# Patient Record
Sex: Female | Born: 1945 | Race: White | Hispanic: No | Marital: Married | Smoking: Never smoker
Health system: Southern US, Community
[De-identification: ages and names within clinical notes are randomized; demographics above are authoritative.]

## PROBLEM LIST (undated history)

## (undated) DIAGNOSIS — Z923 Personal history of irradiation: Secondary | ICD-10-CM

## (undated) DIAGNOSIS — K5790 Diverticulosis of intestine, part unspecified, without perforation or abscess without bleeding: Secondary | ICD-10-CM

## (undated) DIAGNOSIS — K635 Polyp of colon: Secondary | ICD-10-CM

## (undated) DIAGNOSIS — K7689 Other specified diseases of liver: Secondary | ICD-10-CM

## (undated) DIAGNOSIS — N200 Calculus of kidney: Secondary | ICD-10-CM

## (undated) DIAGNOSIS — I7300 Raynaud's syndrome without gangrene: Secondary | ICD-10-CM

## (undated) DIAGNOSIS — L719 Rosacea, unspecified: Secondary | ICD-10-CM

## (undated) DIAGNOSIS — K219 Gastro-esophageal reflux disease without esophagitis: Secondary | ICD-10-CM

## (undated) DIAGNOSIS — D219 Benign neoplasm of connective and other soft tissue, unspecified: Secondary | ICD-10-CM

## (undated) DIAGNOSIS — G609 Hereditary and idiopathic neuropathy, unspecified: Secondary | ICD-10-CM

## (undated) DIAGNOSIS — Z853 Personal history of malignant neoplasm of breast: Secondary | ICD-10-CM

## (undated) DIAGNOSIS — C801 Malignant (primary) neoplasm, unspecified: Secondary | ICD-10-CM

## (undated) DIAGNOSIS — H269 Unspecified cataract: Secondary | ICD-10-CM

## (undated) DIAGNOSIS — R0781 Pleurodynia: Secondary | ICD-10-CM

## (undated) DIAGNOSIS — M1990 Unspecified osteoarthritis, unspecified site: Secondary | ICD-10-CM

## (undated) DIAGNOSIS — M719 Bursopathy, unspecified: Secondary | ICD-10-CM

## (undated) DIAGNOSIS — Z87442 Personal history of urinary calculi: Secondary | ICD-10-CM

## (undated) DIAGNOSIS — K589 Irritable bowel syndrome without diarrhea: Secondary | ICD-10-CM

## (undated) DIAGNOSIS — K648 Other hemorrhoids: Secondary | ICD-10-CM

## (undated) DIAGNOSIS — C4491 Basal cell carcinoma of skin, unspecified: Secondary | ICD-10-CM

## (undated) DIAGNOSIS — M810 Age-related osteoporosis without current pathological fracture: Secondary | ICD-10-CM

## (undated) HISTORY — DX: Raynaud's syndrome without gangrene: I73.00

## (undated) HISTORY — DX: Age-related osteoporosis without current pathological fracture: M81.0

## (undated) HISTORY — PX: CATARACT EXTRACTION: SUR2

## (undated) HISTORY — DX: Benign neoplasm of connective and other soft tissue, unspecified: D21.9

## (undated) HISTORY — DX: Rosacea, unspecified: L71.9

## (undated) HISTORY — DX: Other specified diseases of liver: K76.89

## (undated) HISTORY — DX: Gastro-esophageal reflux disease without esophagitis: K21.9

## (undated) HISTORY — DX: Calculus of kidney: N20.0

## (undated) HISTORY — DX: Polyp of colon: K63.5

## (undated) HISTORY — PX: HYSTEROSCOPY: SHX211

## (undated) HISTORY — PX: DILATION AND CURETTAGE OF UTERUS: SHX78

## (undated) HISTORY — DX: Pleurodynia: R07.81

## (undated) HISTORY — DX: Unspecified cataract: H26.9

## (undated) HISTORY — PX: TONSILLECTOMY: SUR1361

## (undated) HISTORY — DX: Diverticulosis of intestine, part unspecified, without perforation or abscess without bleeding: K57.90

## (undated) HISTORY — DX: Basal cell carcinoma of skin, unspecified: C44.91

## (undated) HISTORY — DX: Malignant (primary) neoplasm, unspecified: C80.1

## (undated) HISTORY — PX: EXTRACORPOREAL SHOCK WAVE LITHOTRIPSY: SHX1557

## (undated) HISTORY — DX: Irritable bowel syndrome, unspecified: K58.9

## (undated) HISTORY — DX: Bursopathy, unspecified: M71.9

---

## 1971-11-29 HISTORY — PX: SUPRANUMERARY NIPPLE EXCISION: SHX2471

## 1971-11-29 HISTORY — PX: BREAST EXCISIONAL BIOPSY: SUR124

## 1999-05-01 DIAGNOSIS — K219 Gastro-esophageal reflux disease without esophagitis: Secondary | ICD-10-CM | POA: Insufficient documentation

## 1999-07-22 DIAGNOSIS — K648 Other hemorrhoids: Secondary | ICD-10-CM | POA: Insufficient documentation

## 2002-09-23 ENCOUNTER — Encounter: Admission: RE | Admit: 2002-09-23 | Discharge: 2002-09-23 | Payer: Self-pay | Admitting: Endocrinology

## 2002-09-23 ENCOUNTER — Encounter: Payer: Self-pay | Admitting: Endocrinology

## 2004-11-15 ENCOUNTER — Ambulatory Visit: Payer: Self-pay | Admitting: Internal Medicine

## 2004-11-28 DIAGNOSIS — C801 Malignant (primary) neoplasm, unspecified: Secondary | ICD-10-CM

## 2004-11-28 DIAGNOSIS — K7689 Other specified diseases of liver: Secondary | ICD-10-CM

## 2004-11-28 HISTORY — PX: BREAST LUMPECTOMY: SHX2

## 2004-11-28 HISTORY — DX: Malignant (primary) neoplasm, unspecified: C80.1

## 2004-11-28 HISTORY — DX: Other specified diseases of liver: K76.89

## 2005-03-02 ENCOUNTER — Ambulatory Visit: Payer: Self-pay | Admitting: Internal Medicine

## 2005-03-02 DIAGNOSIS — K589 Irritable bowel syndrome without diarrhea: Secondary | ICD-10-CM | POA: Insufficient documentation

## 2005-03-04 ENCOUNTER — Ambulatory Visit: Payer: Self-pay | Admitting: Internal Medicine

## 2005-03-09 ENCOUNTER — Ambulatory Visit: Payer: Self-pay | Admitting: Internal Medicine

## 2005-08-10 ENCOUNTER — Encounter: Admission: RE | Admit: 2005-08-10 | Discharge: 2005-08-10 | Payer: Self-pay | Admitting: General Surgery

## 2005-08-22 ENCOUNTER — Ambulatory Visit (HOSPITAL_COMMUNITY): Admission: RE | Admit: 2005-08-22 | Discharge: 2005-08-22 | Payer: Self-pay | Admitting: General Surgery

## 2005-08-22 ENCOUNTER — Encounter (INDEPENDENT_AMBULATORY_CARE_PROVIDER_SITE_OTHER): Payer: Self-pay | Admitting: General Surgery

## 2005-08-22 ENCOUNTER — Ambulatory Visit (HOSPITAL_BASED_OUTPATIENT_CLINIC_OR_DEPARTMENT_OTHER): Admission: RE | Admit: 2005-08-22 | Discharge: 2005-08-22 | Payer: Self-pay | Admitting: General Surgery

## 2005-08-22 ENCOUNTER — Encounter (INDEPENDENT_AMBULATORY_CARE_PROVIDER_SITE_OTHER): Payer: Self-pay | Admitting: *Deleted

## 2005-08-23 ENCOUNTER — Ambulatory Visit (HOSPITAL_BASED_OUTPATIENT_CLINIC_OR_DEPARTMENT_OTHER): Payer: Medicare Other | Admitting: Oncology

## 2005-09-01 ENCOUNTER — Ambulatory Visit: Admission: RE | Admit: 2005-09-01 | Discharge: 2005-11-24 | Payer: Self-pay | Admitting: Radiation Oncology

## 2005-09-07 ENCOUNTER — Ambulatory Visit (HOSPITAL_COMMUNITY): Admission: RE | Admit: 2005-09-07 | Discharge: 2005-09-07 | Payer: Self-pay | Admitting: Radiation Oncology

## 2005-09-20 ENCOUNTER — Ambulatory Visit (HOSPITAL_COMMUNITY): Admission: RE | Admit: 2005-09-20 | Discharge: 2005-09-20 | Payer: Self-pay | Admitting: Oncology

## 2005-11-17 ENCOUNTER — Ambulatory Visit: Payer: Self-pay | Admitting: Oncology

## 2006-01-30 ENCOUNTER — Ambulatory Visit: Payer: Self-pay | Admitting: Oncology

## 2006-04-04 ENCOUNTER — Encounter: Admission: RE | Admit: 2006-04-04 | Discharge: 2006-04-04 | Payer: Self-pay | Admitting: General Surgery

## 2006-05-17 ENCOUNTER — Ambulatory Visit: Payer: Self-pay | Admitting: Internal Medicine

## 2006-05-25 ENCOUNTER — Encounter (INDEPENDENT_AMBULATORY_CARE_PROVIDER_SITE_OTHER): Payer: Self-pay | Admitting: *Deleted

## 2006-05-25 ENCOUNTER — Ambulatory Visit: Payer: Self-pay | Admitting: Internal Medicine

## 2006-05-25 ENCOUNTER — Encounter: Payer: Self-pay | Admitting: Internal Medicine

## 2006-05-25 DIAGNOSIS — D126 Benign neoplasm of colon, unspecified: Secondary | ICD-10-CM | POA: Insufficient documentation

## 2006-07-25 ENCOUNTER — Encounter: Admission: RE | Admit: 2006-07-25 | Discharge: 2006-07-25 | Payer: Self-pay | Admitting: General Surgery

## 2006-07-26 ENCOUNTER — Encounter (INDEPENDENT_AMBULATORY_CARE_PROVIDER_SITE_OTHER): Payer: Self-pay | Admitting: *Deleted

## 2006-07-26 ENCOUNTER — Ambulatory Visit (HOSPITAL_COMMUNITY): Admission: RE | Admit: 2006-07-26 | Discharge: 2006-07-26 | Payer: Self-pay | Admitting: Oncology

## 2006-08-01 ENCOUNTER — Ambulatory Visit: Payer: Self-pay | Admitting: Oncology

## 2006-08-03 LAB — COMPREHENSIVE METABOLIC PANEL
ALT: 15 U/L (ref 0–40)
AST: 21 U/L (ref 0–37)
Albumin: 4.4 g/dL (ref 3.5–5.2)
Alkaline Phosphatase: 81 U/L (ref 39–117)
BUN: 18 mg/dL (ref 6–23)
CO2: 29 mEq/L (ref 19–32)
Calcium: 9.6 mg/dL (ref 8.4–10.5)
Chloride: 106 mEq/L (ref 96–112)
Creatinine, Ser: 0.74 mg/dL (ref 0.40–1.20)
Glucose, Bld: 98 mg/dL (ref 70–99)
Potassium: 4.9 mEq/L (ref 3.5–5.3)
Sodium: 143 mEq/L (ref 135–145)
Total Bilirubin: 0.3 mg/dL (ref 0.3–1.2)
Total Protein: 7 g/dL (ref 6.0–8.3)

## 2006-08-03 LAB — CBC WITH DIFFERENTIAL/PLATELET
BASO%: 0.4 % (ref 0.0–2.0)
Basophils Absolute: 0 10*3/uL (ref 0.0–0.1)
EOS%: 1.8 % (ref 0.0–7.0)
Eosinophils Absolute: 0.1 10*3/uL (ref 0.0–0.5)
HCT: 39.6 % (ref 34.8–46.6)
HGB: 13.6 g/dL (ref 11.6–15.9)
LYMPH%: 32.5 % (ref 14.0–48.0)
MCH: 31 pg (ref 26.0–34.0)
MCHC: 34.3 g/dL (ref 32.0–36.0)
MCV: 90.4 fL (ref 81.0–101.0)
MONO#: 0.4 10*3/uL (ref 0.1–0.9)
MONO%: 8.2 % (ref 0.0–13.0)
NEUT#: 2.8 10*3/uL (ref 1.5–6.5)
NEUT%: 57.1 % (ref 39.6–76.8)
Platelets: 293 10*3/uL (ref 145–400)
RBC: 4.38 10*6/uL (ref 3.70–5.32)
RDW: 13.3 % (ref 11.3–14.5)
WBC: 4.9 10*3/uL (ref 3.9–10.0)
lymph#: 1.6 10*3/uL (ref 0.9–3.3)

## 2006-08-03 LAB — LACTATE DEHYDROGENASE: LDH: 174 U/L (ref 94–250)

## 2006-09-21 ENCOUNTER — Encounter (INDEPENDENT_AMBULATORY_CARE_PROVIDER_SITE_OTHER): Payer: Self-pay | Admitting: *Deleted

## 2006-09-21 ENCOUNTER — Ambulatory Visit (HOSPITAL_COMMUNITY): Admission: RE | Admit: 2006-09-21 | Discharge: 2006-09-21 | Payer: Self-pay | Admitting: Oncology

## 2007-01-22 ENCOUNTER — Ambulatory Visit: Payer: Self-pay | Admitting: Oncology

## 2007-01-24 LAB — CBC WITH DIFFERENTIAL/PLATELET
BASO%: 0.5 % (ref 0.0–2.0)
Basophils Absolute: 0 10*3/uL (ref 0.0–0.1)
EOS%: 1.6 % (ref 0.0–7.0)
Eosinophils Absolute: 0.1 10*3/uL (ref 0.0–0.5)
HCT: 38.4 % (ref 34.8–46.6)
HGB: 13.5 g/dL (ref 11.6–15.9)
LYMPH%: 36.1 % (ref 14.0–48.0)
MCH: 31 pg (ref 26.0–34.0)
MCHC: 35.1 g/dL (ref 32.0–36.0)
MCV: 88.3 fL (ref 81.0–101.0)
MONO#: 0.4 10*3/uL (ref 0.1–0.9)
MONO%: 5.9 % (ref 0.0–13.0)
NEUT#: 3.6 10*3/uL (ref 1.5–6.5)
NEUT%: 55.9 % (ref 39.6–76.8)
Platelets: 303 10*3/uL (ref 145–400)
RBC: 4.35 10*6/uL (ref 3.70–5.32)
RDW: 13.3 % (ref 11.3–14.5)
WBC: 6.5 10*3/uL (ref 3.9–10.0)
lymph#: 2.3 10*3/uL (ref 0.9–3.3)

## 2007-01-24 LAB — COMPREHENSIVE METABOLIC PANEL
ALT: 20 U/L (ref 0–35)
AST: 26 U/L (ref 0–37)
Albumin: 4.6 g/dL (ref 3.5–5.2)
Alkaline Phosphatase: 81 U/L (ref 39–117)
BUN: 16 mg/dL (ref 6–23)
CO2: 28 mEq/L (ref 19–32)
Calcium: 9.6 mg/dL (ref 8.4–10.5)
Chloride: 102 mEq/L (ref 96–112)
Creatinine, Ser: 0.84 mg/dL (ref 0.40–1.20)
Glucose, Bld: 112 mg/dL — ABNORMAL HIGH (ref 70–99)
Potassium: 4 mEq/L (ref 3.5–5.3)
Sodium: 139 mEq/L (ref 135–145)
Total Bilirubin: 0.4 mg/dL (ref 0.3–1.2)
Total Protein: 7.4 g/dL (ref 6.0–8.3)

## 2007-01-24 LAB — LACTATE DEHYDROGENASE: LDH: 184 U/L (ref 94–250)

## 2007-06-04 ENCOUNTER — Ambulatory Visit (HOSPITAL_COMMUNITY): Admission: RE | Admit: 2007-06-04 | Discharge: 2007-06-04 | Payer: Self-pay | Admitting: Urology

## 2007-07-22 ENCOUNTER — Ambulatory Visit: Payer: Self-pay | Admitting: Oncology

## 2007-07-31 ENCOUNTER — Encounter: Admission: RE | Admit: 2007-07-31 | Discharge: 2007-07-31 | Payer: Self-pay | Admitting: Oncology

## 2007-09-06 ENCOUNTER — Ambulatory Visit: Payer: Self-pay | Admitting: Internal Medicine

## 2008-02-05 DIAGNOSIS — Z853 Personal history of malignant neoplasm of breast: Secondary | ICD-10-CM | POA: Insufficient documentation

## 2008-04-02 ENCOUNTER — Ambulatory Visit: Payer: Self-pay | Admitting: Oncology

## 2008-04-04 LAB — CBC WITH DIFFERENTIAL/PLATELET
BASO%: 1.5 % (ref 0.0–2.0)
Basophils Absolute: 0.1 10*3/uL (ref 0.0–0.1)
EOS%: 0.6 % (ref 0.0–7.0)
Eosinophils Absolute: 0 10*3/uL (ref 0.0–0.5)
HCT: 38.3 % (ref 34.8–46.6)
HGB: 13.3 g/dL (ref 11.6–15.9)
LYMPH%: 31 % (ref 14.0–48.0)
MCH: 31.1 pg (ref 26.0–34.0)
MCHC: 34.8 g/dL (ref 32.0–36.0)
MCV: 89.3 fL (ref 81.0–101.0)
MONO#: 0.5 10*3/uL (ref 0.1–0.9)
MONO%: 7.5 % (ref 0.0–13.0)
NEUT#: 3.7 10*3/uL (ref 1.5–6.5)
NEUT%: 59.4 % (ref 39.6–76.8)
Platelets: 273 10*3/uL (ref 145–400)
RBC: 4.3 10*6/uL (ref 3.70–5.32)
RDW: 12.9 % (ref 11.3–14.5)
WBC: 6.2 10*3/uL (ref 3.9–10.0)
lymph#: 1.9 10*3/uL (ref 0.9–3.3)

## 2008-04-07 LAB — COMPREHENSIVE METABOLIC PANEL
ALT: 13 U/L (ref 0–35)
AST: 20 U/L (ref 0–37)
Albumin: 4.4 g/dL (ref 3.5–5.2)
Alkaline Phosphatase: 55 U/L (ref 39–117)
BUN: 15 mg/dL (ref 6–23)
CO2: 26 mEq/L (ref 19–32)
Calcium: 8.9 mg/dL (ref 8.4–10.5)
Chloride: 108 mEq/L (ref 96–112)
Creatinine, Ser: 0.81 mg/dL (ref 0.40–1.20)
Glucose, Bld: 96 mg/dL (ref 70–99)
Potassium: 4.2 mEq/L (ref 3.5–5.3)
Sodium: 144 mEq/L (ref 135–145)
Total Bilirubin: 0.5 mg/dL (ref 0.3–1.2)
Total Protein: 7 g/dL (ref 6.0–8.3)

## 2008-04-07 LAB — VITAMIN D 25 HYDROXY (VIT D DEFICIENCY, FRACTURES): Vit D, 25-Hydroxy: 36 ng/mL (ref 30–89)

## 2008-04-07 LAB — LACTATE DEHYDROGENASE: LDH: 190 U/L (ref 94–250)

## 2008-07-31 ENCOUNTER — Encounter: Admission: RE | Admit: 2008-07-31 | Discharge: 2008-07-31 | Payer: Self-pay | Admitting: Oncology

## 2008-09-05 ENCOUNTER — Encounter: Payer: Self-pay | Admitting: Internal Medicine

## 2008-10-17 ENCOUNTER — Ambulatory Visit: Payer: Self-pay | Admitting: Oncology

## 2008-10-21 LAB — CBC WITH DIFFERENTIAL/PLATELET
BASO%: 0.4 % (ref 0.0–2.0)
Basophils Absolute: 0 10*3/uL (ref 0.0–0.1)
EOS%: 1.1 % (ref 0.0–7.0)
Eosinophils Absolute: 0.1 10*3/uL (ref 0.0–0.5)
HCT: 39 % (ref 34.8–46.6)
HGB: 13.5 g/dL (ref 11.6–15.9)
LYMPH%: 39.1 % (ref 14.0–48.0)
MCH: 31 pg (ref 26.0–34.0)
MCHC: 34.5 g/dL (ref 32.0–36.0)
MCV: 89.9 fL (ref 81.0–101.0)
MONO#: 0.4 10*3/uL (ref 0.1–0.9)
MONO%: 7.2 % (ref 0.0–13.0)
NEUT#: 3.1 10*3/uL (ref 1.5–6.5)
NEUT%: 52.2 % (ref 39.6–76.8)
Platelets: 268 10*3/uL (ref 145–400)
RBC: 4.34 10*6/uL (ref 3.70–5.32)
RDW: 13 % (ref 11.3–14.5)
WBC: 6 10*3/uL (ref 3.9–10.0)
lymph#: 2.3 10*3/uL (ref 0.9–3.3)

## 2008-10-22 LAB — COMPREHENSIVE METABOLIC PANEL
ALT: 14 U/L (ref 0–35)
AST: 20 U/L (ref 0–37)
Albumin: 4.4 g/dL (ref 3.5–5.2)
Alkaline Phosphatase: 57 U/L (ref 39–117)
BUN: 15 mg/dL (ref 6–23)
CO2: 26 mEq/L (ref 19–32)
Calcium: 9.6 mg/dL (ref 8.4–10.5)
Chloride: 101 mEq/L (ref 96–112)
Creatinine, Ser: 0.82 mg/dL (ref 0.40–1.20)
Glucose, Bld: 89 mg/dL (ref 70–99)
Potassium: 4.9 mEq/L (ref 3.5–5.3)
Sodium: 141 mEq/L (ref 135–145)
Total Bilirubin: 0.3 mg/dL (ref 0.3–1.2)
Total Protein: 6.7 g/dL (ref 6.0–8.3)

## 2008-10-22 LAB — VITAMIN D 25 HYDROXY (VIT D DEFICIENCY, FRACTURES): Vit D, 25-Hydroxy: 37 ng/mL (ref 30–89)

## 2008-10-22 LAB — LACTATE DEHYDROGENASE: LDH: 171 U/L (ref 94–250)

## 2008-10-22 LAB — CANCER ANTIGEN 27.29: CA 27.29: 31 U/mL (ref 0–39)

## 2009-03-27 ENCOUNTER — Encounter: Payer: Self-pay | Admitting: Internal Medicine

## 2009-04-24 ENCOUNTER — Ambulatory Visit: Payer: Self-pay | Admitting: Oncology

## 2009-05-18 LAB — CBC WITH DIFFERENTIAL/PLATELET
BASO%: 0.4 % (ref 0.0–2.0)
Basophils Absolute: 0 10*3/uL (ref 0.0–0.1)
EOS%: 1.5 % (ref 0.0–7.0)
Eosinophils Absolute: 0.1 10*3/uL (ref 0.0–0.5)
HCT: 36.9 % (ref 34.8–46.6)
HGB: 13 g/dL (ref 11.6–15.9)
LYMPH%: 33.8 % (ref 14.0–49.7)
MCH: 31.9 pg (ref 25.1–34.0)
MCHC: 35.3 g/dL (ref 31.5–36.0)
MCV: 90.4 fL (ref 79.5–101.0)
MONO#: 0.4 10*3/uL (ref 0.1–0.9)
MONO%: 6.7 % (ref 0.0–14.0)
NEUT#: 3.7 10*3/uL (ref 1.5–6.5)
NEUT%: 57.6 % (ref 38.4–76.8)
Platelets: 267 10*3/uL (ref 145–400)
RBC: 4.08 10*6/uL (ref 3.70–5.45)
RDW: 13.4 % (ref 11.2–14.5)
WBC: 6.4 10*3/uL (ref 3.9–10.3)
lymph#: 2.2 10*3/uL (ref 0.9–3.3)

## 2009-05-19 LAB — COMPREHENSIVE METABOLIC PANEL
ALT: 20 U/L (ref 0–35)
AST: 23 U/L (ref 0–37)
Albumin: 4.2 g/dL (ref 3.5–5.2)
Alkaline Phosphatase: 58 U/L (ref 39–117)
BUN: 13 mg/dL (ref 6–23)
CO2: 27 mEq/L (ref 19–32)
Calcium: 9.5 mg/dL (ref 8.4–10.5)
Chloride: 105 mEq/L (ref 96–112)
Creatinine, Ser: 0.77 mg/dL (ref 0.40–1.20)
Glucose, Bld: 125 mg/dL — ABNORMAL HIGH (ref 70–99)
Potassium: 4.3 mEq/L (ref 3.5–5.3)
Sodium: 141 mEq/L (ref 135–145)
Total Bilirubin: 0.3 mg/dL (ref 0.3–1.2)
Total Protein: 6.6 g/dL (ref 6.0–8.3)

## 2009-05-19 LAB — LACTATE DEHYDROGENASE: LDH: 173 U/L (ref 94–250)

## 2009-05-19 LAB — VITAMIN D 25 HYDROXY (VIT D DEFICIENCY, FRACTURES): Vit D, 25-Hydroxy: 45 ng/mL (ref 30–89)

## 2009-06-29 ENCOUNTER — Ambulatory Visit: Payer: Self-pay | Admitting: Family Medicine

## 2009-06-29 DIAGNOSIS — M810 Age-related osteoporosis without current pathological fracture: Secondary | ICD-10-CM | POA: Insufficient documentation

## 2009-06-29 LAB — CONVERTED CEMR LAB
Bilirubin Urine: NEGATIVE
Blood in Urine, dipstick: NEGATIVE
Glucose, Urine, Semiquant: NEGATIVE
Ketones, urine, test strip: NEGATIVE
Nitrite: NEGATIVE
Protein, U semiquant: NEGATIVE
Specific Gravity, Urine: 1.02
Urobilinogen, UA: 0.2
WBC Urine, dipstick: NEGATIVE
pH: 7

## 2009-07-01 LAB — CONVERTED CEMR LAB
ALT: 22 units/L (ref 0–35)
AST: 29 units/L (ref 0–37)
Albumin: 4.1 g/dL (ref 3.5–5.2)
Alkaline Phosphatase: 52 units/L (ref 39–117)
BUN: 15 mg/dL (ref 6–23)
Basophils Absolute: 0 10*3/uL (ref 0.0–0.1)
Basophils Relative: 0.5 % (ref 0.0–3.0)
Bilirubin, Direct: 0.1 mg/dL (ref 0.0–0.3)
CO2: 32 meq/L (ref 19–32)
Calcium: 8.9 mg/dL (ref 8.4–10.5)
Chloride: 112 meq/L (ref 96–112)
Cholesterol: 209 mg/dL — ABNORMAL HIGH (ref 0–200)
Creatinine, Ser: 0.7 mg/dL (ref 0.4–1.2)
Direct LDL: 120.8 mg/dL
Eosinophils Absolute: 0.1 10*3/uL (ref 0.0–0.7)
Eosinophils Relative: 1.7 % (ref 0.0–5.0)
GFR calc non Af Amer: 89.32 mL/min (ref >60)
Glucose, Bld: 101 mg/dL — ABNORMAL HIGH (ref 70–99)
HCT: 39.5 % (ref 36.0–46.0)
HDL: 70.9 mg/dL (ref >39.00)
Hemoglobin: 13.7 g/dL (ref 12.0–15.0)
Lymphocytes Relative: 38.5 % (ref 12.0–46.0)
Lymphs Abs: 2.3 10*3/uL (ref 0.7–4.0)
MCHC: 34.7 g/dL (ref 30.0–36.0)
MCV: 92.2 fL (ref 78.0–100.0)
Monocytes Absolute: 0.5 10*3/uL (ref 0.1–1.0)
Monocytes Relative: 7.8 % (ref 3.0–12.0)
Neutro Abs: 3.1 10*3/uL (ref 1.4–7.7)
Neutrophils Relative %: 51.5 % (ref 43.0–77.0)
Platelets: 231 10*3/uL (ref 150.0–400.0)
Potassium: 4.8 meq/L (ref 3.5–5.1)
RBC: 4.28 M/uL (ref 3.87–5.11)
RDW: 12.7 % (ref 11.5–14.6)
Sodium: 149 meq/L — ABNORMAL HIGH (ref 135–145)
TSH: 0.82 microintl units/mL (ref 0.35–5.50)
Total Bilirubin: 0.8 mg/dL (ref 0.3–1.2)
Total CHOL/HDL Ratio: 3
Total Protein: 7 g/dL (ref 6.0–8.3)
Triglycerides: 77 mg/dL (ref 0.0–149.0)
VLDL: 15.4 mg/dL (ref 0.0–40.0)
WBC: 6 10*3/uL (ref 4.5–10.5)

## 2009-08-05 ENCOUNTER — Encounter: Admission: RE | Admit: 2009-08-05 | Discharge: 2009-08-05 | Payer: Self-pay | Admitting: Oncology

## 2009-10-06 ENCOUNTER — Encounter: Admission: RE | Admit: 2009-10-06 | Discharge: 2009-10-06 | Payer: Self-pay | Admitting: Surgery

## 2009-10-29 ENCOUNTER — Ambulatory Visit: Payer: Self-pay | Admitting: Family Medicine

## 2009-11-17 ENCOUNTER — Encounter: Payer: Self-pay | Admitting: Internal Medicine

## 2009-11-23 ENCOUNTER — Telehealth: Payer: Self-pay | Admitting: Family Medicine

## 2010-01-27 ENCOUNTER — Ambulatory Visit: Payer: Self-pay | Admitting: Family Medicine

## 2010-01-27 DIAGNOSIS — N390 Urinary tract infection, site not specified: Secondary | ICD-10-CM | POA: Insufficient documentation

## 2010-01-27 DIAGNOSIS — M545 Low back pain, unspecified: Secondary | ICD-10-CM | POA: Insufficient documentation

## 2010-01-27 LAB — CONVERTED CEMR LAB
Bilirubin Urine: NEGATIVE
Glucose, Urine, Semiquant: NEGATIVE
Ketones, urine, test strip: NEGATIVE
Nitrite: NEGATIVE
Protein, U semiquant: NEGATIVE
Specific Gravity, Urine: 1.015
Urobilinogen, UA: 0.2
pH: 6

## 2010-02-01 ENCOUNTER — Ambulatory Visit: Payer: Self-pay | Admitting: Internal Medicine

## 2010-06-24 ENCOUNTER — Ambulatory Visit: Payer: Self-pay | Admitting: Oncology

## 2010-06-28 LAB — CBC WITH DIFFERENTIAL/PLATELET
BASO%: 0.4 % (ref 0.0–2.0)
Basophils Absolute: 0 10*3/uL (ref 0.0–0.1)
EOS%: 2 % (ref 0.0–7.0)
Eosinophils Absolute: 0.1 10*3/uL (ref 0.0–0.5)
HCT: 38.3 % (ref 34.8–46.6)
HGB: 13.1 g/dL (ref 11.6–15.9)
LYMPH%: 36.8 % (ref 14.0–49.7)
MCH: 31.4 pg (ref 25.1–34.0)
MCHC: 34.2 g/dL (ref 31.5–36.0)
MCV: 91.6 fL (ref 79.5–101.0)
MONO#: 0.5 10*3/uL (ref 0.1–0.9)
MONO%: 8.3 % (ref 0.0–14.0)
NEUT#: 2.9 10*3/uL (ref 1.5–6.5)
NEUT%: 52.5 % (ref 38.4–76.8)
Platelets: 285 10*3/uL (ref 145–400)
RBC: 4.18 10*6/uL (ref 3.70–5.45)
RDW: 13.5 % (ref 11.2–14.5)
WBC: 5.6 10*3/uL (ref 3.9–10.3)
lymph#: 2.1 10*3/uL (ref 0.9–3.3)

## 2010-06-28 LAB — VITAMIN D 25 HYDROXY (VIT D DEFICIENCY, FRACTURES): Vit D, 25-Hydroxy: 55 ng/mL (ref 30–89)

## 2010-06-28 LAB — COMPREHENSIVE METABOLIC PANEL
ALT: 23 U/L (ref 0–35)
AST: 27 U/L (ref 0–37)
Albumin: 4.5 g/dL (ref 3.5–5.2)
Alkaline Phosphatase: 62 U/L (ref 39–117)
BUN: 16 mg/dL (ref 6–23)
CO2: 26 mEq/L (ref 19–32)
Calcium: 10 mg/dL (ref 8.4–10.5)
Chloride: 104 mEq/L (ref 96–112)
Creatinine, Ser: 0.79 mg/dL (ref 0.40–1.20)
Glucose, Bld: 101 mg/dL — ABNORMAL HIGH (ref 70–99)
Potassium: 4.3 mEq/L (ref 3.5–5.3)
Sodium: 141 mEq/L (ref 135–145)
Total Bilirubin: 0.3 mg/dL (ref 0.3–1.2)
Total Protein: 6.8 g/dL (ref 6.0–8.3)

## 2010-06-28 LAB — LACTATE DEHYDROGENASE: LDH: 198 U/L (ref 94–250)

## 2010-07-01 ENCOUNTER — Encounter: Payer: Self-pay | Admitting: Cardiology

## 2010-08-06 ENCOUNTER — Encounter: Admission: RE | Admit: 2010-08-06 | Discharge: 2010-08-06 | Payer: Self-pay | Admitting: Oncology

## 2010-08-11 ENCOUNTER — Ambulatory Visit: Payer: Self-pay | Admitting: Family Medicine

## 2010-08-11 LAB — CONVERTED CEMR LAB
Bilirubin Urine: NEGATIVE
Blood in Urine, dipstick: NEGATIVE
Glucose, Urine, Semiquant: NEGATIVE
Ketones, urine, test strip: NEGATIVE
Nitrite: NEGATIVE
Protein, U semiquant: NEGATIVE
Specific Gravity, Urine: 1.02
Urobilinogen, UA: 0.2
WBC Urine, dipstick: NEGATIVE
pH: 7

## 2010-08-12 LAB — CONVERTED CEMR LAB
ALT: 57 units/L — ABNORMAL HIGH (ref 0–35)
AST: 44 units/L — ABNORMAL HIGH (ref 0–37)
Albumin: 4.2 g/dL (ref 3.5–5.2)
Alkaline Phosphatase: 62 units/L (ref 39–117)
BUN: 16 mg/dL (ref 6–23)
Basophils Absolute: 0 10*3/uL (ref 0.0–0.1)
Basophils Relative: 0.5 % (ref 0.0–3.0)
Bilirubin, Direct: 0.1 mg/dL (ref 0.0–0.3)
CO2: 32 meq/L (ref 19–32)
Calcium: 9.7 mg/dL (ref 8.4–10.5)
Chloride: 106 meq/L (ref 96–112)
Cholesterol: 244 mg/dL — ABNORMAL HIGH (ref 0–200)
Creatinine, Ser: 0.8 mg/dL (ref 0.4–1.2)
Direct LDL: 150.2 mg/dL
Eosinophils Absolute: 0.1 10*3/uL (ref 0.0–0.7)
Eosinophils Relative: 2.1 % (ref 0.0–5.0)
GFR calc non Af Amer: 82.2 mL/min (ref >60)
Glucose, Bld: 85 mg/dL (ref 70–99)
HCT: 39.6 % (ref 36.0–46.0)
HDL: 78.1 mg/dL (ref >39.00)
Hemoglobin: 13.6 g/dL (ref 12.0–15.0)
Lymphocytes Relative: 39.6 % (ref 12.0–46.0)
Lymphs Abs: 2.3 10*3/uL (ref 0.7–4.0)
MCHC: 34.2 g/dL (ref 30.0–36.0)
MCV: 91.9 fL (ref 78.0–100.0)
Monocytes Absolute: 0.6 10*3/uL (ref 0.1–1.0)
Monocytes Relative: 9.7 % (ref 3.0–12.0)
Neutro Abs: 2.8 10*3/uL (ref 1.4–7.7)
Neutrophils Relative %: 48.1 % (ref 43.0–77.0)
Platelets: 284 10*3/uL (ref 150.0–400.0)
Potassium: 5.5 meq/L — ABNORMAL HIGH (ref 3.5–5.1)
RBC: 4.31 M/uL (ref 3.87–5.11)
RDW: 13.6 % (ref 11.5–14.6)
Sodium: 144 meq/L (ref 135–145)
TSH: 0.92 microintl units/mL (ref 0.35–5.50)
Total Bilirubin: 0.6 mg/dL (ref 0.3–1.2)
Total CHOL/HDL Ratio: 3
Total Protein: 6.6 g/dL (ref 6.0–8.3)
Triglycerides: 57 mg/dL (ref 0.0–149.0)
VLDL: 11.4 mg/dL (ref 0.0–40.0)
WBC: 5.8 10*3/uL (ref 4.5–10.5)

## 2010-12-19 ENCOUNTER — Encounter: Payer: Self-pay | Admitting: Oncology

## 2010-12-28 NOTE — Assessment & Plan Note (Signed)
Summary: NEW TO [facility] FAST//CCM   Vital Signs:  Patient profile:   65 year old female Menstrual status:  postmenopausal Height:      67 inches Weight:      139 pounds BMI:     21.85 Temp:     98.5 degrees F oral BP sitting:   118 / 80  (left arm) Cuff size:   regular  Vitals Entered By: Kern Reap CMA (June 29, 2009 10:22 AM)  Reason for Visit new to establish   History of Present Illness: Dana Chambers is a 65 year old female, who comes in today as a new patient for physical examination  She states she has no current health concerns.  She has had a number of issues in the past.  She's had a tonsillectomy a benign cyst removed from her right breast in 1973 Job returns to lithotripsy x 2.  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 Donnie Coffin.  Her oncologist.  She has a history of osteopenia,   Gerd reflux esophagitis, kidney stones.  The cyst on liver rosacea   Her current medications are been even Prilosec OTC tamoxifen, calcium, vitamin D.  Advised to take an 81 mg, baby aspirin.  She gets routine eye care.  Dental care, colonoscopy, and GI by Dr. Dickie La.  Last tetanus booster 2007.  Preventive Screening-Counseling & Management  Caffeine-Diet-Exercise     Does Patient Exercise: yes  Hep-HIV-STD-Contraception     Dental Visit-last 6 months yes      Drug Use:  no.    Allergies (verified): No Known Drug Allergies  Past History:  Past medical, surgical, family and social histories (including risk factors) reviewed, and no changes noted (except as noted below).  Past Medical History: Current Problems:  Hx of CARCINOMA IN SITU OF BREAST (ICD-233.0) GERD (ICD-530.81) IRRITABLE BOWEL SYNDROME (ICD-564.1) Hx of HEMORRHOIDS, INTERNAL (ICD-455.0) COLONIC POLYPS (ICD-211.3) Osteopenia kidney stones cyst on liver rosacea tinnitus  Past Surgical History: lithotripsy brest nipple removed - paget's  disease - 2006   Family History: Reviewed history and no changes required. Father: deceased 47 - heart attack, stroke Mother: deceased 34 - demenia, depression, osteoporosis Siblings: 1 sister healthy  Social History: Reviewed history and no changes required. 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 No apparent alopecia or balding. Eyes:  No corneal or conjunctival inflammation noted. EOMI. Perrla. Funduscopic exam benign, without hemorrhages, exudates or papilledema. Vision grossly normal. Ears:  External ear exam shows no significant lesions or deformities.  Otoscopic examination reveals clear canals, tympanic membranes are intact bilaterally without bulging, retraction, inflammation or discharge. Hearing is grossly normal bilaterally. Nose:  External nasal examination shows no deformity or inflammation. Nasal mucosa are pink and moist without lesions or exudates. Mouth:  Oral mucosa and oropharynx without lesions or exudates.  Teeth in good repair. Neck:  No deformities, masses, or tenderness noted. Chest Wall:  No deformities, masses, or tenderness noted. Lungs:  Normal respiratory effort, chest expands symmetrically. Lungs are clear to auscultation, no crackles or wheezes. Heart:  Normal rate and regular rhythm. S1 and S2 normal without gallop, murmur, click, rub or  other extra sounds. Abdomen:  Bowel sounds positive,abdomen soft and non-tender without masses, organomegaly or hernias noted. Msk:  No deformity or scoliosis noted of thoracic or lumbar spine.   Pulses:  R and L carotid,radial,femoral,dorsalis pedis and  posterior tibial pulses are full and equal bilaterally Extremities:  No clubbing, cyanosis, edema, or deformity noted with normal full range of motion of all joints.   Neurologic:  No cranial nerve deficits noted. Station and gait are normal. Plantar reflexes are down-going bilaterally. DTRs are symmetrical throughout. Sensory, motor and coordinative functions appear intact. Skin:  Intact without suspicious lesions or rashes Cervical Nodes:  No lymphadenopathy noted Axillary Nodes:  No palpable lymphadenopathy Inguinal Nodes:  No significant adenopathy Psych:  Cognition and judgment appear intact. Alert and cooperative with normal attention span and concentration. No apparent delusions, illusions, hallucinations   Impression & Recommendations:  Problem # 1:  Preventive Health Care (ICD-V70.0) Assessment Comment Only  Complete Medication List: 1)  Prilosec 20 Mg Cpdr (Omeprazole) .... Take 2 tablets by mouth once daily 2)  Boniva 150 Mg Tabs (Ibandronate sodium) .... Take one tab every month 3)  Tamoxifen Citrate 10 Mg Tabs (Tamoxifen citrate) .... As needed 4)  Daily Combo Multivits/calcium Tabs (Multiple vitamins-calcium) .... Take one tab once daily 5)  Vitamin D 1000 Unit Tabs (Cholecalciferol) .... Take one tab once daily  Other Orders: Prescription Created Electronically [phone]) Venipuncture (63016) TLB-Lipid Panel (80061-LIPID) TLB-BMP (Basic Metabolic Panel-BMET) (80048-METABOL) TLB-CBC Platelet - w/Differential (85025-CBCD) TLB-Hepatic/Liver Function Pnl (80076-HEPATIC) TLB-TSH (Thyroid Stimulating Hormone) (84443-TSH) UA Dipstick w/o Micro (automated)  (81003) EKG w/ Interpretation (93000)  Patient Instructions: 1)  Please schedule a follow-up appointment in 1 year. 2)  It is important that you exercise regularly at least 20 minutes 5 times a week. If you develop chest pain, have severe difficulty breathing, or feel very tired , stop exercising immediately and seek medical  attention. 3)  Schedule your mammogram. 4)  Schedule a colonoscopy/sigmoidoscopy to help detect colon cancer. 5)  Take calcium +Vitamin D daily. 6)  Take an Aspirin every day. Prescriptions: BONIVA 150 MG TABS (IBANDRONATE SODIUM) take one tab every month  #3 x 4   Entered and Authorized by:   Roderick Pee MD   Signed by:   Roderick Pee MD on 06/29/2009   Method used:   Electronically to        [facility]. [phone]* (retail)       [address]       [place], [place]  [address]       Ph: [phone]       Fax: [phone]   RxID:   [id number]   Laboratory Results   Urine Tests    Routine Urinalysis   Color: yellow Appearance: Clear Glucose: negative   (Normal Range: Negative) Bilirubin: negative   (Normal Range: Negative) Ketone: negative   (Normal Range: Negative) Spec. Gravity: 1.020   (Normal Range: 1.003-1.035) Blood: negative   (Normal Range: Negative) pH: 7.0   (Normal Range: 5.0-8.0) Protein: negative   (Normal Range: Negative) Urobilinogen: 0.2   (Normal Range: 0-1) Nitrite: negative   (Normal Range: Negative) Leukocyte Esterace: negative   (Normal Range: Negative)    Comments: Rita Ohara  June 29, 2009 11:37 AM

## 2010-12-28 NOTE — Procedures (Signed)
Summary: Gastroenterology EGD  Gastroenterology EGD   Imported By: Christie Nottingham 02/06/2008 09:30:35  _____________________________________________________________________  External Attachment:    Type:   Image     Comment:   External Document  Appended Document: Gastroenterology EGD RUT NEGATIVE

## 2010-12-28 NOTE — Miscellaneous (Signed)
Summary: prilosec refill  Clinical Lists Changes  Medications: Added new medication of PRILOSEC 20 MG CPDR (OMEPRAZOLE) Take 2 tablets by mouth once daily - Signed Rx of PRILOSEC 20 MG CPDR (OMEPRAZOLE) Take 2 tablets by mouth once daily;  #60 x 4;  Signed;  Entered by: Hortense Ramal CMA;  Authorized by: Hart Carwin MD;  Method used: Electronically to [facility] #[id number]*, [address] [place], [place], [place]  [address], Ph: [phone], Fax: [phone]    Prescriptions: PRILOSEC 20 MG CPDR (OMEPRAZOLE) Take 2 tablets by mouth once daily  #60 x 4   Entered by:   Hortense Ramal CMA   Authorized by:   Hart Carwin MD   Signed by:   Hortense Ramal CMA on 09/05/2008   Method used:   Electronically to        [facility]. #[id number]* (retail)       [address]       [place], [place]  [address]       Ph: [phone]       Fax: [phone]   RxID:   [id number]

## 2010-12-28 NOTE — Progress Notes (Signed)
Summary: eye cold  Phone Note Call from Patient   Caller: Patient Summary of Call: patient is calling because she has a cold in her eyes.  they are "matted" at night.  should she come in or rx called in Initial call taken by: Kern Reap CMA Duncan Dull),  November 23, 2009 5:13 PM  Follow-up for Phone Call        needs ov for this Follow-up by: Nelwyn Salisbury MD,  November 24, 2009 11:37 AM  Additional Follow-up for Phone Call Additional follow up Details #1::        Pt is some better today.  Will wait and call if she wants appt. Additional Follow-up by: Lynann Beaver CMA,  November 24, 2009 12:58 PM

## 2010-12-28 NOTE — Procedures (Signed)
Summary: Gastroenterology Colon  Gastroenterology Colon   Imported By: Christie Nottingham 02/06/2008 09:29:32  _____________________________________________________________________  External Attachment:    Type:   Image     Comment:   External Document

## 2010-12-28 NOTE — Assessment & Plan Note (Signed)
Summary: refill prilosec...as.    History of Present Illness Visit Type: Follow-up Visit Primary GI MD: [name] MD Primary Provider: Kelle Darting, MD  Requesting Provider: n/a Chief Complaint: GERD.  Refill for Prilosec. Pt denies any GI complaints History of Present Illness:   This is a 65 year old white female with irritable bowel syndrome and gastroesophageal reflux which was diagnosed in 1995 and again in 2006 on an upper endoscopy. We have also seen her in the past for colorectal screening. A colonoscopy completed in June 2007 showed a hyperplastic polyp. There is a family history of colon cancer in a grandparent. She now comes for refills of Prilosec and asks if it is necessary that she take it. She denies any heartburn, dysphagia, hoarseness, coughing or any chest pain. A CT Scan of the abdomen in 2007 showed a questionable lesion in the dome of the liver which was shown to be stable, most likely a hemangioma. There is a history of breast cancer and a left ureteral stone.   GI Review of Systems      Denies abdominal pain, acid reflux, belching, bloating, chest pain, dysphagia with liquids, dysphagia with solids, heartburn, loss of appetite, nausea, vomiting, vomiting blood, weight loss, and  weight gain.        Denies anal fissure, black tarry stools, change in bowel habit, constipation, diarrhea, diverticulosis, fecal incontinence, heme positive stool, hemorrhoids, irritable bowel syndrome, jaundice, light color stool, liver problems, rectal bleeding, and  rectal pain.    Current Medications (verified): 1)  Prilosec 20 Mg Cpdr (Omeprazole) .... Take 2 Tablets By Mouth Once Daily. Must Have Office Visit For Further Refills! 2)  Boniva 150 Mg Tabs (Ibandronate Sodium) .... Take One Tab Every Month 3)  Tamoxifen Citrate 10 Mg Tabs (Tamoxifen Citrate) .... As Needed 4)  Daily Combo Multivits/calcium  Tabs (Multiple Vitamins-Calcium) .... Take One Tab Once Daily 5)  Vitamin D 1000  Unit Tabs (Cholecalciferol) .... Take One Tab Once Daily 6)  Ciprofloxacin Hcl 250 Mg Tabs (Ciprofloxacin Hcl) .... Take 1 Tablet By Mouth Two Times A Day 7)  Aspirin 81 Mg  Tabs (Aspirin) .... One Tablet By Mouth Once Daily  Allergies (verified): No Known Drug Allergies  Past History:  Past Medical History: Reviewed history from 06/29/2009 and no changes required. 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 lithotripsy brest nipple removed - paget's disease - 2006   Family History: Reviewed history from 01/28/2010 and no changes required. Father: deceased 77 - heart attack, stroke Mother: deceased 95 - demenia, depression, osteoporosis Siblings: 1 sister healthy Family History of Colon Cancer: Maternal Grandmother  Social History: Reviewed history from 06/29/2009 and no changes required. 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 Alcohol use-yes Drug use-no Regular exercise-yes  Review of Systems  The patient denies allergy/sinus, anemia, anxiety-new, arthritis/joint pain, back pain, blood in urine, breast changes/lumps, change in vision, confusion, cough, coughing up blood, depression-new, fainting, fatigue, fever, headaches-new, hearing problems, heart murmur, heart rhythm changes, itching, menstrual pain, muscle pains/cramps, night sweats, nosebleeds, pregnancy symptoms, shortness of breath, skin rash, sleeping problems, sore throat, swelling of feet/legs, swollen lymph glands, thirst - excessive , urination - excessive , urination changes/pain, urine leakage, vision changes, and voice change.  Pertinent positive and negative review of systems were noted in the above HPI. All  other ROS was otherwise negative.   Vital Signs:  Patient profile:   65 year old female Menstrual status:  postmenopausal Height:      67 inches Weight:      141 pounds BMI:     22.16 BSA:     1.74 Pulse rate:   74 / minute Pulse rhythm:   regular BP sitting:   112 / 68  (left arm) Cuff size:   regular  Vitals Entered By: Ok Anis CMA (February 01, 2010 10:09 AM)  Physical Exam  General:  Well developed, well nourished, no acute distress. Eyes:  PERRLA, no icterus. Mouth:  No deformity or lesions, dentition normal. Neck:  Supple; no masses or thyromegaly. Lungs:  Clear throughout to auscultation. Heart:  Regular rate and rhythm; no murmurs, rubs,  or bruits. Abdomen:  soft abdomen with decreased muscle tone. Nontender. Normoactive bowel sounds. Liver edge at costal margin. Extremities:  No clubbing, cyanosis, edema or deformities noted. Skin:  Intact without significant lesions or rashes. Psych:  Alert and cooperative. Normal mood and affect.   Impression & Recommendations:  Problem # 1:  GERD (ICD-530.81) Patient has a  history of gastroesophageal reflux but is currently asymptomatic. I have asked patient to stop the Prilosec and start Pepcid 40 mg daily and later on p.r.n. She has been on Boniva without any gastrointestinal side effects. I alerted her to the fact that the Boniva may have GI side effects and that she needs  to call us back if she needs Prilosec.   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  Problem # 3:  COLONIC POLYPS (ICD-211.3) Patient had a hyperplastic polyp seen on colonoscopy in June 2007. She has a positive family history of colon cancer in a grandparent. A recall colonoscopy will be due in June 2014.  Patient Instructions: 1)  Pepcid 40 mg daily and p.r.n. 2)  Antireflux measures. 3)  Recall colonoscopy June 2014. 4)  Copy sent to : Dr Mervin Hack, Dr Shela Commons.Todd 5)  The medication list  was reviewed and reconciled.  All changed / newly prescribed medications were explained.  A complete medication list was provided to the patient / caregiver. Prescriptions: PEPCID 40 MG TABS (FAMOTIDINE) Take 1 tablet by mouth once a day  #30 x 6   Entered by:   Hortense Ramal CMA (AAMA)   Authorized by:   Hart Carwin MD   Signed by:   Hortense Ramal CMA (AAMA) on 02/01/2010   Method used:   Electronically to        [facility]. [phone]* (retail)       [phone] [address]       [place], [place]  [address]       Ph: [phone]       Fax: [phone]   RxID:   [id number]

## 2010-12-28 NOTE — Assessment & Plan Note (Signed)
Summary: pt will come in fasting/njr   Vital Signs:  Patient profile:   66 year old female Menstrual status:  postmenopausal Height:      66.75 inches Weight:      141 pounds Temp:     98.1 degrees F oral BP sitting:   110 / 80  Vitals Entered By: Kathrynn Speed CMA (August 11, 2010 8:35 AM) CC: cpx, fasting, src Is Patient Diabetic? No Flu Vaccine Consent Questions     Do you have a history of severe allergic reactions to this vaccine? no    Any prior history of allergic reactions to egg and/or gelatin? no    Do you have a sensitivity to the preservative Thimersol? no    Do you have a past history of Guillan-Barre Syndrome? no    Do you currently have an acute febrile illness? no    Have you ever had a severe reaction to latex? no    Vaccine information given and explained to patient? yes    Are you currently pregnant? no    Lot Number:[id number]   Exp Date:05/28/2011   Site Given  Left Deltoid IM   Primary Care Provider:  Kelle Darting, MD   CC:  cpx, fasting, and src.  History of Present Illness: Dana Chambers , is a 65 year old, married female, nonsmoker, who comes in today for general physical examination  In 2006.  She was diagnosed to have a right breast cancer......... Paget's disease......... she subsequently had a surgery by Dr. Chase Caller postop radiation and has done well with no complications.  She is now off her tamoxifen.  She's been on the knee, but for more than 3 years recommend she stop that and take calcium, vitamin D, and exercise, however, she has a kidney stone.  Should check with her urologist about a calcium supplement.  She takes Pepcid p.r.n. for reflux a multivitamin and 81-mg baby aspirin and Cipro 250 mg for prophylaxis of UTIs.  She gets routine eye care, dental care, BSE monthly, annual mammography, colonoscopy by Dr. Dickie La, normal.  She complains of stiffness and soreness in her hands, hips, knees, SMAC that come and go.  She is not taking any  anti-inflammatories.  She also sees a dermatologist on a regular basis because of history of dysplastic nevi Here for Medicare AWV:  1.   Risk factors based on Past M, S, F history:........Marland Kitchenreviewed no change 2.   Physical Activities: walks daily 3.   Depression/mood: good mood.  No depression 4.   Hearing: slight hearing loss.  Does not request.  Hearing evaluation 5.   ADL's: normal 6.   Fall Risk: reviewed none 7.   Home Safety: reviewed.  No guns in the house 8.   Height, weight, &visual acuity:height weight stable.  Vision normal except for some bilateral cataracts 9.   Counseling: continue exercise stop the boniva 10.   Labs ordered based on risk factors: .........done today 11.           Referral Coordination///////none indicated 12.           Care Plan,,,,,,,,,,,,,,reviewed in detail 13.            Cognitive Assessment .Marland Kitchen..oriented x 3 able to do all mass and financial calculations  Preventive Screening-Counseling & Management  Alcohol-Tobacco     Smoking Status: never  Current Medications (verified): 1)  Pepcid 40 Mg Tabs (Famotidine) .... Take 1 Tablet By Mouth Once A Day 2)  Boniva 150 Mg Tabs (Ibandronate Sodium) .... Take  One Tab Every Month 3)  Daily Combo Multivits/calcium  Tabs (Multiple Vitamins-Calcium) .... Take One Tab Once Daily 4)  Ciprofloxacin Hcl 250 Mg Tabs (Ciprofloxacin Hcl) .... Take 1 Tablet By Mouth Two Times A Day 5)  Aspirin 81 Mg  Tabs (Aspirin) .... One Tablet By Mouth Once Daily  Allergies (verified): No Known Drug Allergies  Past History:  Past medical, surgical, family and social histories (including risk factors) reviewed, and no changes noted (except as noted below).  Past Medical History: Reviewed history from 06/29/2009 and no changes required. 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 lithotripsy brest nipple removed - paget's disease - 2006   Family History: Reviewed history from 01/28/2010 and no changes required. Father: deceased 18 - heart attack, stroke Mother: deceased 52 - demenia, depression, osteoporosis Siblings: 1 sister healthy Family History of Colon Cancer: Maternal Grandmother  Social History: Reviewed history from 06/29/2009 and no changes required. 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 No apparent alopecia or balding. Eyes:  No corneal or conjunctival inflammation noted. EOMI. Perrla. Funduscopic exam benign, without hemorrhages, exudates or papilledema. Vision grossly normal. Ears:  External ear exam shows no significant lesions or deformities.  Otoscopic examination reveals clear canals, tympanic membranes are intact bilaterally without bulging, retraction, inflammation or discharge. Hearing is grossly normal bilaterally. Nose:  External nasal examination shows no deformity or inflammation. Nasal mucosa are pink and moist without lesions or exudates. Mouth:  Oral mucosa and oropharynx without lesions or exudates.  Teeth in good repair. Neck:  No deformities, masses, or tenderness noted. Chest Wall:  No deformities, masses, or tenderness noted. Breasts:  the left breast is normal.  The right breast shows evidence of well-healed scars from previous surgery.  There are tattoos from previous radiation.  She also has multiple small cystic lesions most prominent in the axillary area  around the 1011 o'clock position.  This soft, movable, and tender Lungs:  Normal respiratory effort, chest expands symmetrically. Lungs are clear to auscultation, no crackles or wheezes. Heart:  Normal rate and regular rhythm. S1 and S2 normal without gallop, murmur, click, rub or other extra sounds. Abdomen:  Bowel sounds positive,abdomen soft and non-tender without masses, organomegaly or hernias noted. Msk:  No deformity or scoliosis noted of thoracic or lumbar spine.   Pulses:  R and L carotid,radial,femoral,dorsalis pedis and posterior tibial pulses are full and equal bilaterally Extremities:  No clubbing, cyanosis, edema, or deformity noted with normal full range of motion of all joints.   Neurologic:  No cranial nerve deficits noted. Station and gait are normal. Plantar reflexes are down-going bilaterally. DTRs are symmetrical throughout. Sensory, motor and coordinative functions appear intact. Skin:  Intact without suspicious lesions or rashes Cervical Nodes:  No lymphadenopathy noted Axillary Nodes:  No palpable lymphadenopathy Inguinal Nodes:  No significant adenopathy Psych:  Cognition and judgment appear intact. Alert and cooperative with normal attention span and concentration. No apparent delusions, illusions, hallucinations   Impression & Recommendations:  Problem # 1:  HEALTH SCREENING (  ICD-V70.0) Assessment Unchanged  Orders: Venipuncture (62130) TLB-Lipid Panel (80061-LIPID) TLB-BMP (Basic Metabolic Panel-BMET) (80048-METABOL) TLB-CBC Platelet - w/Differential (85025-CBCD) TLB-Hepatic/Liver Function Pnl (80076-HEPATIC) TLB-TSH (Thyroid Stimulating Hormone) (86578-ION) Prescription Created Electronically [phone]) Medicare -1st Annual Wellness Visit [phone]) Urinalysis-dipstick only (Medicare patient) (44010UV) EKG w/ Interpretation (93000) Specimen Handling (25366)  Problem # 2:  BACK PAIN, LUMBAR (ICD-724.2)  Her updated medication list for this problem includes:     Aspirin 81 Mg Tabs (Aspirin) ..... One tablet by mouth once daily  Orders: Venipuncture (44034) TLB-Lipid Panel (80061-LIPID) TLB-BMP (Basic Metabolic Panel-BMET) (80048-METABOL) TLB-CBC Platelet - w/Differential (85025-CBCD) TLB-Hepatic/Liver Function Pnl (80076-HEPATIC) TLB-TSH (Thyroid Stimulating Hormone) (74259-DGL) Prescription Created Electronically [phone]) Medicare -1st Annual Wellness Visit [phone]) Urinalysis-dipstick only (Medicare patient) (18841YS) Specimen Handling (06301)  Problem # 3:  OSTEOPENIA (ICD-733.90) Assessment: Unchanged  The following medications were removed from the medication list:    Boniva 150 Mg Tabs (Ibandronate sodium) .Marland Kitchen... Take one tab every month  Orders: Venipuncture (60109) TLB-Lipid Panel (80061-LIPID) TLB-BMP (Basic Metabolic Panel-BMET) (80048-METABOL) TLB-CBC Platelet - w/Differential (85025-CBCD) TLB-Hepatic/Liver Function Pnl (80076-HEPATIC) TLB-TSH (Thyroid Stimulating Hormone) (32355-DDU) Prescription Created Electronically [phone]) Medicare -1st Annual Wellness Visit [phone]) Urinalysis-dipstick only (Medicare patient) (37628BT) EKG w/ Interpretation (93000) Specimen Handling (51761)  Complete Medication List: 1)  Pepcid 40 Mg Tabs (Famotidine) .... Take 1 tablet by mouth once a day 2)  Daily Combo Multivits/calcium Tabs (Multiple vitamins-calcium) .... Take one tab once daily 3)  Ciprofloxacin Hcl 250 Mg Tabs (Ciprofloxacin hcl) .... Take 1 tablet by mouth two times a day 4)  Aspirin 81 Mg Tabs (Aspirin) .... One tablet by mouth once daily  Other Orders: Admin 1st Vaccine (60737) Flu Vaccine 20yrs + [phone])  Patient Instructions: 1)  if your stomach, with tolerated, you might want to consider taking Motrin 400 mg twice daily with food for the joint pain.  He might also need to take a Pepcid on a daily basis. 2)  Check with your urologist about a calcium supplement. 3)  Please schedule a follow-up appointment in 1  year. Prescriptions: CIPROFLOXACIN HCL 250 MG TABS (CIPROFLOXACIN HCL) Take 1 tablet by mouth two times a day  #30 x 1   Entered and Authorized by:   Roderick Pee MD   Signed by:   Roderick Pee MD on 08/11/2010   Method used:   Print then Give to Patient   RxID:   [id number] PEPCID 40 MG TABS (FAMOTIDINE) Take 1 tablet by mouth once a day  #100 x 3   Entered and Authorized by:   Roderick Pee MD   Signed by:   Roderick Pee MD on 08/11/2010   Method used:   Print then Give to Patient   RxID:   [id number]   Laboratory Results   Urine Tests  Date/Time Recieved: August 11, 2010 10:44 AM  Date/Time Reported: August 11, 2010 10:44 AM   Routine Urinalysis   Color: yellow Appearance: Clear Glucose: negative   (Normal Range: Negative) Bilirubin: negative   (Normal Range: Negative) Ketone: negative   (Normal Range: Negative) Spec. Gravity: 1.020   (Normal Range: 1.003-1.035) Blood: negative   (Normal Range: Negative) pH: 7.0   (Normal Range: 5.0-8.0) Protein: negative   (Normal Range: Negative) Urobilinogen: 0.2   (Normal Range: 0-1) Nitrite: negative   (Normal Range: Negative) Leukocyte Esterace: negative   (Normal Range: Negative)    Comments: Wynona Canes, CMA  August 11, 2010 10:44 AM

## 2010-12-28 NOTE — Letter (Signed)
[facility]: [facility]   Imported By: Roderic Ovens 08/31/2010 15:27:43  _____________________________________________________________________  External Attachment:    Type:   Image     Comment:   External Document

## 2010-12-28 NOTE — Assessment & Plan Note (Signed)
Summary: FLU-SHOT // RS   Nurse Visit   Allergies: No Known Drug Allergies  Review of Systems       Flu Vaccine Consent Questions     Do you have a history of severe allergic reactions to this vaccine? no    Any prior history of allergic reactions to egg and/or gelatin? no    Do you have a sensitivity to the preservative Thimersol? no    Do you have a past history of Guillan-Barre Syndrome? no    Do you currently have an acute febrile illness? no    Have you ever had a severe reaction to latex? no    Vaccine information given and explained to patient? yes    Are you currently pregnant? no    Lot Number:[id number]   Exp Date:05/27/2010   Site Given  Left Deltoid IM    Orders Added: 1)  Admin 1st Vaccine [90471] 2)  Flu Vaccine 88yrs + [16109]

## 2010-12-28 NOTE — Miscellaneous (Signed)
Summary: Prilosec Rx  Clinical Lists Changes  Medications: Rx of PRILOSEC 20 MG CPDR (OMEPRAZOLE) Take 2 tablets by mouth once daily;  #60 x 5;  Signed;  Entered by: Hortense Ramal CMA;  Authorized by: Hart Carwin;  Method used: Electronically to [facility] #[id number]*, [address], [place], [place], [place]  [address], Ph: [phone], Fax: [phone]    Prescriptions: PRILOSEC 20 MG CPDR (OMEPRAZOLE) Take 2 tablets by mouth once daily  #60 x 5   Entered by:   Hortense Ramal CMA   Authorized by:   Hart Carwin   Signed by:   Hortense Ramal CMA on 03/27/2009   Method used:   Electronically to        [facility] #[id number]* (retail)       [address]       [place], [place]  [address]       Ph: [phone]       Fax: [phone]   RxID:   [id number]

## 2010-12-28 NOTE — Assessment & Plan Note (Signed)
Summary: POSSIBLE UTI / BLADDER INF // RS   Vital Signs:  Patient profile:   65 year old female Menstrual status:  postmenopausal Weight:      143 pounds Temp:     98.4 degrees F oral BP sitting:   120 / 80  (left arm) Cuff size:   regular  Vitals Entered By: Kern Reap CMA Duncan Dull) (January 27, 2010 10:53 AM)  Reason for Visit uti  History of Present Illness: H. is a 65 year old, married female, nonsmoker, who comes in with a 4 day history of dysuria and frequency.  No fever, chills.  She's had a history of recurrent urinary tract infections, and at one point, her urologist  gave her Macrobid to take prophylactically  Allergies: No Known Drug Allergies  Past History:  Past medical, surgical, family and social histories (including risk factors) reviewed for relevance to current acute and chronic problems.  Past Medical History: Reviewed history from 06/29/2009 and no changes required. 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 lithotripsy brest nipple removed - paget's disease - 2006   Family History: Reviewed history from 06/29/2009 and no changes required. Father: deceased 35 - heart attack, stroke Mother: deceased 106 - demenia, depression, osteoporosis Siblings: 1 sister healthy  Social History: Reviewed history from 06/29/2009 and no changes required. 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   Problems:  Medical Problems Added: 1)  Dx of Uti  (ICD-599.0) 2)  Dx of Back Pain, Lumbar  (ICD-724.2)  Impression & Recommendations:  Problem # 1:  UTI (ICD-599.0) Assessment New  Her updated medication list for this problem includes:    Ciprofloxacin Hcl 250 Mg Tabs (Ciprofloxacin hcl) .Marland Kitchen... Take 1 tablet by mouth two times a day  Orders: Prescription Created Electronically [phone])  Complete Medication List: 1)  Prilosec 20 Mg Cpdr (Omeprazole) .... Take 2 tablets by mouth once daily. must have office visit for further refills! 2)  Boniva 150 Mg Tabs (Ibandronate sodium) .... Take one tab every month 3)  Tamoxifen Citrate 10 Mg Tabs (Tamoxifen citrate) .... As needed 4)  Daily Combo Multivits/calcium Tabs (Multiple vitamins-calcium) .... Take one tab once daily 5)  Vitamin D 1000 Unit Tabs (Cholecalciferol) .... Take one tab once daily 6)  Ciprofloxacin Hcl 250 Mg Tabs (Ciprofloxacin hcl) .... Take 1 tablet by mouth two times a day  Other Orders: UA Dipstick w/o Micro (manual) (57846)  Patient Instructions: 1)  drink 30 ounces of water daily, start Cipro, one twice a day for 7 days.  Also, I will give u some extra  medication........ take one tablet prior to intercourse p.r.n. Prescriptions: CIPROFLOXACIN HCL 250 MG TABS (CIPROFLOXACIN HCL) Take 1 tablet by mouth two times a day  #30 x 1   Entered and Authorized by:   Roderick Pee MD   Signed by:   Roderick Pee MD on 01/27/2010   Method used:   Electronically to        [facility] [phone]* (retail)  [address]       [place], [place]  [address]       Ph: [phone]       Fax: [phone]   RxID:   [id number]   Laboratory Results   Urine Tests  Date/Time Received: January 27, 2010   Routine Urinalysis   Color: yellow Appearance: Cloudy Glucose: negative   (Normal Range: Negative) Bilirubin: negative    (Normal Range: Negative) Ketone: negative   (Normal Range: Negative) Spec. Gravity: 1.015   (Normal Range: 1.003-1.035) Blood: large   (Normal Range: Negative) pH: 6.0   (Normal Range: 5.0-8.0) Protein: negative   (Normal Range: Negative) Urobilinogen: 0.2   (Normal Range: 0-1) Nitrite: negative   (Normal Range: Negative) Leukocyte Esterace: large   (Normal Range: Negative)    Comments: Kern Reap CMA (AAMA)  January 27, 2010 11:10 AM

## 2010-12-28 NOTE — Miscellaneous (Signed)
Summary: prilosec rx  Clinical Lists Changes  Medications: Changed medication from PRILOSEC 20 MG CPDR (OMEPRAZOLE) Take 2 tablets by mouth once daily to PRILOSEC 20 MG CPDR (OMEPRAZOLE) Take 2 tablets by mouth once daily. MUST HAVE OFFICE VISIT FOR FURTHER REFILLS! - Signed Rx of PRILOSEC 20 MG CPDR (OMEPRAZOLE) Take 2 tablets by mouth once daily. MUST HAVE OFFICE VISIT FOR FURTHER REFILLS!;  #60 x 0;  Signed;  Entered by: Hortense Ramal CMA (AAMA);  Authorized by: Hart Carwin MD;  Method used: Electronically to [facility]. #[id number]*, [address] [place], [place], [place]  [address], Ph: [phone], Fax: [phone]    Prescriptions: PRILOSEC 20 MG CPDR (OMEPRAZOLE) Take 2 tablets by mouth once daily. MUST HAVE OFFICE VISIT FOR FURTHER REFILLS!  #60 x 0   Entered by:   Hortense Ramal CMA (AAMA)   Authorized by:   Hart Carwin MD   Signed by:   Hortense Ramal CMA (AAMA) on 11/17/2009   Method used:   Electronically to        [facility] [phone]* (retail)       [phone]       [place], [place]  [address]       Ph: [phone]       Fax: [phone]   RxID:   [id number]

## 2011-01-20 ENCOUNTER — Encounter: Payer: Self-pay | Admitting: Family Medicine

## 2011-01-20 ENCOUNTER — Ambulatory Visit
Admission: RE | Admit: 2011-01-20 | Discharge: 2011-01-20 | Disposition: A | Discharge status: Home / Self Care | Payer: BC Managed Care – PPO | Source: Ambulatory Visit | Attending: Family Medicine | Admitting: Family Medicine

## 2011-01-20 ENCOUNTER — Ambulatory Visit (INDEPENDENT_AMBULATORY_CARE_PROVIDER_SITE_OTHER)
Admission: RE | Admit: 2011-01-20 | Discharge: 2011-01-20 | Disposition: A | Discharge status: Home / Self Care | Payer: Medicare Other | Source: Ambulatory Visit | Attending: Family Medicine | Admitting: Family Medicine

## 2011-01-20 ENCOUNTER — Ambulatory Visit (INDEPENDENT_AMBULATORY_CARE_PROVIDER_SITE_OTHER): Payer: Medicare Other | Admitting: Family Medicine

## 2011-01-20 VITALS — BP 112/78 | Temp 98.1°F | Ht 66.75 in | Wt 144.0 lb

## 2011-01-20 DIAGNOSIS — M25562 Pain in left knee: Secondary | ICD-10-CM

## 2011-01-20 DIAGNOSIS — M25569 Pain in unspecified knee: Secondary | ICD-10-CM

## 2011-01-20 DIAGNOSIS — M25561 Pain in right knee: Secondary | ICD-10-CM

## 2011-01-20 NOTE — Patient Instructions (Signed)
Motrin 600 mg twice daily with food, elevation and ice p.r.n.Marland Kitchen  I will call you next week with the report on your x-rays

## 2011-01-20 NOTE — Progress Notes (Signed)
  Subjective:    Patient ID: Dana Chambers, female    DOB: January 24, 1946, 65 y.o.   MRN: [id number]  HPI  Dana Chambers Is a delightful, 65 year old female, who comes in today for evaluation of pain in her knees x 4 months.  She has no history of trauma.  She developed pain in both knees about 4 months ago.  Now there constantly sore.  They have not been swollen, red or hot.  Again, no history of trauma.  She has a history of degenerative disk disease is also having some discomfort in her hips.  Review of Systems Negative    Objective:   Physical Exam Well-developed well-nourished, female, in no acute distress.  Examination of the hips are normal.  The knees appear normal.  The not red, hot, or swollen.  Ligaments are intact.  Circulation, normal.  Skin normal       Assessment & Plan:  Right and left knee pain, etiology unknown.  Plan begin diagnostic workup with x-rays.  Motrin 600 twice daily with food elevation and ice p.r.n. Call x-ray report next week

## 2011-01-20 NOTE — Progress Notes (Signed)
Addended by: Kern Reap on: 01/20/2011 12:34 PM   Modules accepted: Orders

## 2011-02-03 ENCOUNTER — Telehealth: Payer: Self-pay | Admitting: *Deleted

## 2011-02-03 DIAGNOSIS — G8929 Other chronic pain: Secondary | ICD-10-CM

## 2011-02-03 NOTE — Telephone Encounter (Signed)
patient  Is calling because her knees are still stiff and she is having twitching with her leg.  She is not sure if she should go to an orthopedic doctor or neurology.  What and who would you suggest?

## 2011-02-05 NOTE — Telephone Encounter (Signed)
Neurology consult

## 2011-02-09 ENCOUNTER — Ambulatory Visit: Payer: BC Managed Care – PPO | Admitting: Family Medicine

## 2011-02-09 ENCOUNTER — Encounter: Payer: Self-pay | Admitting: Family Medicine

## 2011-02-09 ENCOUNTER — Ambulatory Visit (INDEPENDENT_AMBULATORY_CARE_PROVIDER_SITE_OTHER): Payer: Medicare Other | Admitting: Family Medicine

## 2011-02-09 VITALS — BP 132/80 | Temp 98.5°F | Ht 67.0 in | Wt 142.0 lb

## 2011-02-09 DIAGNOSIS — M545 Low back pain, unspecified: Secondary | ICD-10-CM

## 2011-02-09 NOTE — Progress Notes (Signed)
  Subjective:    Patient ID: Dana Chambers, female    DOB: [birth year], 65 y.o.   MRN: [id number]  HPI Dana Chambers is a 65 year old female, married, nonsmoker, who comes in today as an acute patient for evaluation of back pain for 11 years.  She's had this problem off and on for 11 years.  It hurts in the upper back.  The lower back.  It comes and go's no disability.  No neurologic symptoms.  It one time she went to see a medical doctor, who referred her for two neurology.  They did a number of diagnostic studies all of which were normal.  Neurologic review of systems negative.  She states now the pain is intermittent and points different parts of her back as a source for her pain.  It's a 5 on a scale of one to 10.  She takes over-the-counter Motrin with no avail.   Review of Systems    Negative Objective:   Physical Exam    Well-developed well-nourished, female, in no acute distress, musculoskeletal and perfectly normal.  Neurologic exam normal    Assessment & Plan:  Migratory back pain.  Plan refer to rheumatology for further diagnostic studies

## 2011-02-09 NOTE — Patient Instructions (Signed)
Take Motrin 400 mg twice daily with food.  We will do to set up for a consult with Dr. Dareen Piano for further evaluation

## 2011-02-14 ENCOUNTER — Ambulatory Visit: Payer: BC Managed Care – PPO | Admitting: Family Medicine

## 2011-02-24 ENCOUNTER — Encounter: Payer: Self-pay | Admitting: Family Medicine

## 2011-04-12 NOTE — Assessment & Plan Note (Signed)
[facility]                         GASTROENTEROLOGY OFFICE NOTE   Liller, Yohn MONI ROTHROCK           MRN:          [id number]  DATE:09/06/2007                            DOB:          15-Aug-1946    PROGRESS NOTE.   Ms. Murrill is a 65 year old white female with chronic gastroesophageal  reflux.  She has had two endoscopies in the past, one in March, 1995,  one in April 2006, which showed essentially normal exam.  She has been  on Aciphex 20 mg initially twice a day, mostly simply once a day, with  good control of her reflux symptoms.  We have also seen her for  colonoscopy.  Last exam in June, 2007, showed diminutive polyp of the  rectosigmoid colon.  Her recall colonoscopy was set for 7 years, which  would be in June, 2014.  This summer Ms. Smead had a lithotripsy for  left ureteral stone.  She has known osteopenia for which she takes  calcium.  She also has a history of breast carcinoma and irritable bowel  syndrome.   MEDICATIONS:  1. Aciphex 20 mg a day.  2. Tamoxifen 20 mg daily.  3. Meloxicam 15 mg p.o. daily.  4. Oracea 40 mg p.o. daily.  5. Vitamin D.  6. Calcium citrate.  7. Multiple vitamins.   PHYSICAL EXAM:  Blood pressure 122/82, pulse 88 and weight 141 pounds.  LUNGS:  Clear to auscultation.  COR:  With normal S1, normal S2.  ABDOMEN:  Soft, nontender with normoactive bowel sounds.  There was  minimal tenderness in left flank and lateral abdominal wall most likely  from recent lithotripsy.  RECTAL EXAM:  Not done.   IMPRESSION:  A 65 year old white female with controlled gastroesophageal  reflux on Aciphex 20 mg a day.  Unfortunately, her insurance will no  longer cover Aciphex and recommends for [place] to go to Prilosec 20 mg a day  or possibly 40 mg a day.  We have discussed the future treatment of her  reflux.  Since it is not severe, I would try Prilosec to avoid lengthy  preauthorization procedures.  She will use  Prilosec either 20 mg a day  or 40 mg a day and will let us know if it works.  She also has  asymptomatic liver cyst on upper abdominal ultrasound, these do not need  to be followed up on a regular basis.    Hedwig Morton. Juanda Chance, MD  Electronically Signed   DMB/MedQ  DD: 09/06/2007  DT: 09/06/2007  Job #: [id number]   cc:   Ace Gins, MD

## 2011-04-15 NOTE — Op Note (Signed)
NAMEDEYONA, Dana Chambers           ACCOUNT NO.:  [id number]   MEDICAL RECORD NO.:  [id number]          PATIENT TYPE:  AMB   LOCATION:  DSC                          FACILITY:  [facility]   PHYSICIAN:  Rose Phi. Maple Hudson, M.D.   DATE OF BIRTH:  Apr 21, 1946   DATE OF PROCEDURE:  08/22/2005  DATE OF DISCHARGE:                                 OPERATIVE REPORT   PREOPERATIVE DIAGNOSIS:  Paget's disease of the right nipple.   POSTOPERATIVE DIAGNOSIS:  Paget's disease of the right nipple.   OPERATION:  Right partial mastectomy.   SURGEON:  Rose Phi. Maple Hudson, M.D.   ANESTHESIA:  General.   OPERATIVE PROCEDURE:  After suitable general anesthesia was induced, the  patient was placed in the supine position with the arms on the side on the  arm board. The right breast was prepped and draped in usual fashion.   A transverse elliptical incision was then outlined incorporating the nipple-  areolar complex. Incision was made and I excised the central portion of the  breast without going all the way down to the chest wall. The MRI had failed  to show any enhancement so it was thought that this involvement was going to  be very superficial.   We then injected 0.25% Marcaine in the incision. With good hemostasis, it  was closed in two layers of 3-0 Vicryl and subcuticular 4-0 Monocryl and  Steri-Strips. Dressing applied. The patient transferred to recovery room in  satisfactory condition having tolerated procedure well.      Rose Phi. Maple Hudson, M.D.  Electronically Signed     PRY/MEDQ  D:  08/22/2005  T:  08/22/2005  Job:  [id number]

## 2011-04-29 ENCOUNTER — Encounter: Payer: Self-pay | Admitting: Family Medicine

## 2011-05-10 ENCOUNTER — Ambulatory Visit: Payer: Medicare Other | Attending: Neurology

## 2011-05-10 DIAGNOSIS — M542 Cervicalgia: Secondary | ICD-10-CM | POA: Insufficient documentation

## 2011-05-10 DIAGNOSIS — IMO0001 Reserved for inherently not codable concepts without codable children: Secondary | ICD-10-CM | POA: Insufficient documentation

## 2011-05-10 DIAGNOSIS — M545 Low back pain, unspecified: Secondary | ICD-10-CM | POA: Insufficient documentation

## 2011-05-12 ENCOUNTER — Ambulatory Visit: Payer: Medicare Other

## 2011-05-16 ENCOUNTER — Ambulatory Visit: Payer: BC Managed Care – PPO

## 2011-05-16 ENCOUNTER — Ambulatory Visit: Payer: Medicare Other | Admitting: Physical Therapy

## 2011-05-18 ENCOUNTER — Ambulatory Visit: Payer: Medicare Other

## 2011-05-20 ENCOUNTER — Ambulatory Visit: Payer: Medicare Other

## 2011-05-23 ENCOUNTER — Ambulatory Visit: Payer: Medicare Other | Admitting: Physical Therapy

## 2011-05-26 ENCOUNTER — Ambulatory Visit: Payer: Medicare Other

## 2011-05-31 ENCOUNTER — Ambulatory Visit: Payer: Medicare Other | Attending: Neurology

## 2011-05-31 DIAGNOSIS — M542 Cervicalgia: Secondary | ICD-10-CM | POA: Insufficient documentation

## 2011-05-31 DIAGNOSIS — M545 Low back pain, unspecified: Secondary | ICD-10-CM | POA: Insufficient documentation

## 2011-05-31 DIAGNOSIS — IMO0001 Reserved for inherently not codable concepts without codable children: Secondary | ICD-10-CM | POA: Insufficient documentation

## 2011-06-02 ENCOUNTER — Ambulatory Visit: Payer: Medicare Other

## 2011-06-06 ENCOUNTER — Ambulatory Visit: Payer: Medicare Other | Admitting: Physical Therapy

## 2011-06-09 ENCOUNTER — Ambulatory Visit: Payer: Medicare Other | Admitting: Physical Therapy

## 2011-06-09 ENCOUNTER — Encounter: Payer: Self-pay | Admitting: Internal Medicine

## 2011-06-13 ENCOUNTER — Ambulatory Visit: Payer: Medicare Other

## 2011-06-16 ENCOUNTER — Ambulatory Visit: Payer: Medicare Other

## 2011-06-20 ENCOUNTER — Ambulatory Visit: Payer: Medicare Other

## 2011-06-23 ENCOUNTER — Ambulatory Visit: Payer: Medicare Other

## 2011-07-05 ENCOUNTER — Other Ambulatory Visit: Payer: Self-pay | Admitting: Oncology

## 2011-07-05 DIAGNOSIS — Z853 Personal history of malignant neoplasm of breast: Secondary | ICD-10-CM

## 2011-07-12 ENCOUNTER — Other Ambulatory Visit: Payer: Self-pay | Admitting: Oncology

## 2011-07-12 ENCOUNTER — Encounter: Payer: Medicare Other | Admitting: Oncology

## 2011-07-12 DIAGNOSIS — D0590 Unspecified type of carcinoma in situ of unspecified breast: Secondary | ICD-10-CM

## 2011-07-12 DIAGNOSIS — Z170 Estrogen receptor positive status [ER+]: Secondary | ICD-10-CM

## 2011-07-12 LAB — CBC WITH DIFFERENTIAL/PLATELET
BASO%: 0.5 % (ref 0.0–2.0)
Basophils Absolute: 0 10*3/uL (ref 0.0–0.1)
EOS%: 2.1 % (ref 0.0–7.0)
Eosinophils Absolute: 0.1 10*3/uL (ref 0.0–0.5)
HCT: 40.4 % (ref 34.8–46.6)
HGB: 14 g/dL (ref 11.6–15.9)
LYMPH%: 35 % (ref 14.0–49.7)
MCH: 31.5 pg (ref 25.1–34.0)
MCHC: 34.6 g/dL (ref 31.5–36.0)
MCV: 90.9 fL (ref 79.5–101.0)
MONO#: 0.5 10*3/uL (ref 0.1–0.9)
MONO%: 7.5 % (ref 0.0–14.0)
NEUT#: 3.4 10*3/uL (ref 1.5–6.5)
NEUT%: 54.9 % (ref 38.4–76.8)
Platelets: 282 10*3/uL (ref 145–400)
RBC: 4.45 10*6/uL (ref 3.70–5.45)
RDW: 12.9 % (ref 11.2–14.5)
WBC: 6.2 10*3/uL (ref 3.9–10.3)
lymph#: 2.2 10*3/uL (ref 0.9–3.3)

## 2011-07-12 LAB — COMPREHENSIVE METABOLIC PANEL
ALT: 18 U/L (ref 0–35)
AST: 23 U/L (ref 0–37)
Albumin: 4.2 g/dL (ref 3.5–5.2)
Alkaline Phosphatase: 110 U/L (ref 39–117)
BUN: 23 mg/dL (ref 6–23)
CO2: 31 mEq/L (ref 19–32)
Calcium: 10.1 mg/dL (ref 8.4–10.5)
Chloride: 100 mEq/L (ref 96–112)
Creatinine, Ser: 0.77 mg/dL (ref 0.50–1.10)
Glucose, Bld: 108 mg/dL — ABNORMAL HIGH (ref 70–99)
Potassium: 3.8 mEq/L (ref 3.5–5.3)
Sodium: 138 mEq/L (ref 135–145)
Total Bilirubin: 0.2 mg/dL — ABNORMAL LOW (ref 0.3–1.2)
Total Protein: 7.2 g/dL (ref 6.0–8.3)

## 2011-07-13 LAB — VITAMIN D 25 HYDROXY (VIT D DEFICIENCY, FRACTURES): Vit D, 25-Hydroxy: 50 ng/mL (ref 30–89)

## 2011-07-18 ENCOUNTER — Encounter: Payer: Medicare Other | Admitting: Oncology

## 2011-07-18 ENCOUNTER — Other Ambulatory Visit: Payer: Self-pay | Admitting: Oncology

## 2011-07-18 DIAGNOSIS — M899 Disorder of bone, unspecified: Secondary | ICD-10-CM

## 2011-07-18 DIAGNOSIS — M949 Disorder of cartilage, unspecified: Secondary | ICD-10-CM

## 2011-07-18 DIAGNOSIS — Z170 Estrogen receptor positive status [ER+]: Secondary | ICD-10-CM

## 2011-07-18 DIAGNOSIS — C50919 Malignant neoplasm of unspecified site of unspecified female breast: Secondary | ICD-10-CM

## 2011-07-18 DIAGNOSIS — D0590 Unspecified type of carcinoma in situ of unspecified breast: Secondary | ICD-10-CM

## 2011-08-10 ENCOUNTER — Ambulatory Visit
Admission: RE | Admit: 2011-08-10 | Discharge: 2011-08-10 | Disposition: A | Discharge status: Home / Self Care | Payer: Medicare Other | Source: Ambulatory Visit | Attending: Oncology | Admitting: Oncology

## 2011-08-10 DIAGNOSIS — Z853 Personal history of malignant neoplasm of breast: Secondary | ICD-10-CM

## 2011-08-12 ENCOUNTER — Ambulatory Visit (HOSPITAL_COMMUNITY)
Admission: RE | Admit: 2011-08-12 | Discharge: 2011-08-12 | Disposition: A | Discharge status: Home / Self Care | Payer: Medicare Other | Source: Ambulatory Visit | Attending: Oncology | Admitting: Oncology

## 2011-08-12 DIAGNOSIS — C50919 Malignant neoplasm of unspecified site of unspecified female breast: Secondary | ICD-10-CM

## 2011-08-12 DIAGNOSIS — Z853 Personal history of malignant neoplasm of breast: Secondary | ICD-10-CM | POA: Insufficient documentation

## 2011-08-12 MED ORDER — GADOBENATE DIMEGLUMINE 529 MG/ML IV SOLN
13.0000 mL | Freq: Once | INTRAVENOUS | Status: AC | PRN
  Administered 2011-08-12: 13 mL via INTRAVENOUS

## 2011-09-14 LAB — URINALYSIS, ROUTINE W REFLEX MICROSCOPIC
Bilirubin Urine: NEGATIVE
Glucose, UA: NEGATIVE
Hgb urine dipstick: NEGATIVE
Nitrite: NEGATIVE
Protein, ur: NEGATIVE
Specific Gravity, Urine: 1.02
Urobilinogen, UA: 0.2
pH: 6

## 2011-09-14 LAB — BASIC METABOLIC PANEL
BUN: 16
CO2: 30
Calcium: 9.3
Chloride: 105
Creatinine, Ser: 0.74
GFR calc Af Amer: >60
GFR calc non Af Amer: >60
Glucose, Bld: 138 — ABNORMAL HIGH
Potassium: 4.6
Sodium: 142

## 2011-09-14 LAB — CBC
HCT: 38.5
Hemoglobin: 13.3
MCHC: 34.5
MCV: 89.6
Platelets: 287
RBC: 4.3
RDW: 13.2
WBC: 6.2

## 2011-09-14 LAB — URINE MICROSCOPIC-ADD ON

## 2011-11-07 ENCOUNTER — Encounter: Payer: Self-pay | Admitting: Internal Medicine

## 2011-11-07 ENCOUNTER — Telehealth: Payer: Self-pay | Admitting: Internal Medicine

## 2011-11-07 NOTE — Telephone Encounter (Signed)
Spoke with and scheduled her on 12/l20/12 at 9:30 AM with Dr. Juanda Chance.

## 2011-11-10 ENCOUNTER — Encounter: Payer: Self-pay | Admitting: *Deleted

## 2011-11-17 ENCOUNTER — Ambulatory Visit (INDEPENDENT_AMBULATORY_CARE_PROVIDER_SITE_OTHER): Payer: Medicare Other | Admitting: Internal Medicine

## 2011-11-17 ENCOUNTER — Encounter: Payer: Self-pay | Admitting: Internal Medicine

## 2011-11-17 DIAGNOSIS — Z800 Family history of malignant neoplasm of digestive organs: Secondary | ICD-10-CM

## 2011-11-17 DIAGNOSIS — R1013 Epigastric pain: Secondary | ICD-10-CM

## 2011-11-17 DIAGNOSIS — K625 Hemorrhage of anus and rectum: Secondary | ICD-10-CM

## 2011-11-17 DIAGNOSIS — K219 Gastro-esophageal reflux disease without esophagitis: Secondary | ICD-10-CM

## 2011-11-17 MED ORDER — PEG-KCL-NACL-NASULF-NA ASC-C 100 G PO SOLR: 1.0000 | Freq: Once | ORAL | Status: DC

## 2011-11-17 MED ORDER — SUCRALFATE 1 GM/10ML PO SUSP: 1.0000 g | Freq: Two times a day (BID) | ORAL | Status: DC

## 2011-11-17 MED ORDER — DICLOFENAC SODIUM 75 MG PO TBEC: 75.0000 mg | DELAYED_RELEASE_TABLET | Freq: Every day | ORAL | Status: DC

## 2011-11-17 MED ORDER — ESOMEPRAZOLE MAGNESIUM 40 MG PO CPDR: 40.0000 mg | DELAYED_RELEASE_CAPSULE | Freq: Every day | ORAL | Status: DC

## 2011-11-17 NOTE — Progress Notes (Signed)
Dana Chambers 1946-07-03 MRN [id number]    History of Present Illness:  This is a 65 year old white female with epigastric pain of several months duration. It started after her trip to [place]. She has recently been taking ibuprofen 600 mg several times a day and Naprosyn 500 mg twice a day for arthralgias and myalgias. She has been evaluated by Dr. Anne Hahn and Dr. Dareen Piano for her symptoms but no definite diagnosis has been made other than benign fasciculations. She is trying to cut back on her ibuprofen because of stomach irritation. She has a history of gastritis. An upper endoscopy in 1995 and again in April 2006 for abdominal pain was essentially unremarkable with a negative H. pylori test. She was on tamoxifen for breast cancer until one year ago. She was on Boniva of for osteopenia but stopped taking it one year ago. There is a family history of colon cancer in a grandparent and personal history of a hyperplastic polyp. She has recently had a change in bowel habits and occasional rectal bleeding which she attributes to hemorrhoids. A CT scan of the abdomen in 2007 showed a stable lesion at the dome of the liver consistent with a hemangioma. She was initially taking Pepcid and most recently Prilosec but ran out of it.   Past Medical History  Diagnosis Date  . Breast cancer     Right   . GERD (gastroesophageal reflux disease)   . IBS (irritable bowel syndrome)   . Hemorrhoids   . Hyperplastic colon polyp   . Osteopenia   . Kidney stone   . Liver cyst   . Rosacea   . Tinnitus    Past Surgical History  Procedure Date  . Kidney stone surgery     x 2   . Supranumerary nipple excision 2006  . Breast lumpectomy 2006     Right     reports that she has never smoked. She has never used smokeless tobacco. She reports that she drinks alcohol. She reports that she does not use illicit drugs. family history includes Dementia in her mother; Depression in her mother; Heart attack (age of  onset:57) in her father; Osteoporosis in her mother; Stomach cancer in her maternal grandmother; and Stroke in her father.  There is no history of Colon cancer. No Known Allergies      Review of Systems: Denies dysphagia, odynophagia, chest pain or shortness of breath  The remainder of the 10 point ROS is negative except as outlined in H&P   Physical Exam: General appearance  Well developed, in no distress. Eyes- non icteric. HEENT nontraumatic, normocephalic. Mouth no lesions, tongue papillated, no cheilosis. Neck supple without adenopathy, thyroid not enlarged, no carotid bruits, no JVD. Lungs Clear to auscultation bilaterally. Cor normal S1, normal S2, regular rhythm, no murmur,  quiet precordium. Abdomen: Soft, tender in epigastrium. No distention. Liver edge at costal margin. Normal active bowel sounds.  Rectal: External hemorrhoidal tags. Soft Hemoccult negative stool Extremities no pedal edema. Skin no lesions. Neurological alert and oriented x 3. Psychological normal mood and affect.  Assessment and Plan:  Problem #1 Epigastric pain of several months duration likely associated with nonsteroidal agents. We need to rule out a gastric ulcer or H. pylori gastropathy. She will reduce her intake of anti-inflammatory medications. We will start her on diclofenac 75 mg once a day or every other day in place of Ibuprofen. . She will be scheduled for an upper endoscopy and biopsies. We will start her on  Nexium 40 mg daily and Carafate slurry 1 g twice a day for symptomatic relief of abdominal pain.  Problem #2 Family history of colon cancer in a grandparent. Patient has had recent rectal bleeding. She was due for a repeat colonoscopy next year but we will move ahead and do the colonoscopy at the time of upper endoscopy.   11/17/2011 Dana Chambers

## 2011-11-17 NOTE — Patient Instructions (Addendum)
You have been scheduled for an endoscopy and colonoscopy. Please follow written instructions given to you at your visit today.  Please pick up your prep kit at the pharmacy within the next 2-3 days. We have sent the following medications to your pharmacy for you to pick up at your convenience: Nexium  Carafate Diclofenac CC: Dr Tawanna Cooler

## 2011-12-13 ENCOUNTER — Telehealth: Payer: Self-pay | Admitting: Internal Medicine

## 2011-12-13 NOTE — Telephone Encounter (Signed)
Patient had questions regarding taking her medications today.  I advised her that it was ok to take her regular meds today as prescribed and that she can take her meds in the morning as long as it is before 12:30 pm (cut off time for clear liquids).  She was satisfied with answers given.  Endo/Colon scheduled for tomorrow with Dr. Juanda Chance.

## 2011-12-14 ENCOUNTER — Encounter: Payer: Self-pay | Admitting: Internal Medicine

## 2011-12-14 ENCOUNTER — Ambulatory Visit (AMBULATORY_SURGERY_CENTER): Payer: Medicare Other | Admitting: Internal Medicine

## 2011-12-14 DIAGNOSIS — K219 Gastro-esophageal reflux disease without esophagitis: Secondary | ICD-10-CM

## 2011-12-14 DIAGNOSIS — K625 Hemorrhage of anus and rectum: Secondary | ICD-10-CM

## 2011-12-14 DIAGNOSIS — D126 Benign neoplasm of colon, unspecified: Secondary | ICD-10-CM

## 2011-12-14 DIAGNOSIS — Z800 Family history of malignant neoplasm of digestive organs: Secondary | ICD-10-CM

## 2011-12-14 DIAGNOSIS — R1013 Epigastric pain: Secondary | ICD-10-CM

## 2011-12-14 MED ORDER — SODIUM CHLORIDE 0.9 % IV SOLN: 500.0000 mL | INTRAVENOUS | Status: DC

## 2011-12-14 NOTE — Op Note (Signed)
 [facility] [address] [place], [place]  [address]  ENDOSCOPY PROCEDURE REPORT  PATIENT:  Dana Chambers, Dana Chambers  MR#:  [id number] BIRTHDATE:  1946-01-16, 65 yrs. old  GENDER:  female  ENDOSCOPIST:  Hedwig Morton. Juanda Chance, MD Referred by:  Eugenio Hoes Tawanna Cooler, M.D.  PROCEDURE DATE:  12/14/2011 PROCEDURE:  EGD with biopsy, 43239 ASA CLASS:  Class II INDICATIONS:  abdominal pain epig.pain, took NSAID,s prior EGD [id number]  MEDICATIONS:   These medications were titrated to patient response per physician's verbal order, Versed 5 mg, Fentanyl 50 mcg TOPICAL ANESTHETIC:  Cetacaine Spray  DESCRIPTION OF PROCEDURE:   After the risks benefits and alternatives of the procedure were thoroughly explained, informed consent was obtained.  The LB GIF-H180 [id number] endoscope was introduced through the mouth and advanced to the second portion of the duodenum, without limitations.  The instrument was slowly withdrawn as the mucosa was fully examined. <<PROCEDUREIMAGES>>  A sessile polyp was found (see image4 and image5). fundic gland polyps not biopsies  Otherwise the examination was normal. With standard forceps, a biopsy was obtained and sent to pathology. gastric antrum r/o H (see image1, image2, image3, and image6).Pylori    Retroflexed views revealed no abnormalities. The scope was then withdrawn from the patient and the procedure completed.  COMPLICATIONS:  None  ENDOSCOPIC IMPRESSION: 1) Sessile polyp 2) Otherwise normal examination s/p antral biopsy to r/o H.pylori RECOMMENDATIONS: 1) Await biopsy results I suspect her abd. pain was related to NSAID,s ?IBS  REPEAT EXAM:  In 0 year(s) for.  ______________________________ Hedwig Morton. Juanda Chance, MD  CC:  n. eSIGNED:   Hedwig Morton. Ziad Maye at 12/14/2011 03:43 PM  Mon[place]y Hora, [phone]

## 2011-12-14 NOTE — Op Note (Signed)
[facility] [address] [place], [place]  [address]  COLONOSCOPY PROCEDURE REPORT  PATIENT:  Dana Chambers, Dana Chambers  MR#:  [id number] BIRTHDATE:  1946/02/11, 65 yrs. old  GENDER:  female ENDOSCOPIST:  Hedwig Morton. Juanda Chance, MD REF. BY:  Tinnie Gens A. Tawanna Cooler, M.D. PROCEDURE DATE:  12/14/2011 PROCEDURE:  Colonoscopy with biopsy ASA CLASS:  Class I INDICATIONS:  family history of colon cancer, history of hyperplastic polyps GP with colon cancer MEDICATIONS:   These medications were titrated to patient response per physician's verbal order, Versed 4 mg, Fentanyl 25 mcg  DESCRIPTION OF PROCEDURE:   After the risks and benefits and of the procedure were explained, informed consent was obtained. Digital rectal exam was performed and revealed no rectal masses. The LB PCF-H180AL [id number] endoscope was introduced through the anus and advanced to the cecum, which was identified by both the appendix and ileocecal valve.  The quality of the prep was excellent, using MoviPrep.  The instrument was then slowly withdrawn as the colon was fully examined. <<PROCEDUREIMAGES>>  FINDINGS:  A diminutive polyp was found in the rectum. The polyp was removed using cold biopsy forceps (see image5).  Mild diverticulosis was found (see image1).  This was otherwise a normal examination of the colon (see image2, image3, and image4). Retroflexed views in the rectum revealed no abnormalities.    The scope was then withdrawn from the patient and the procedure completed.  COMPLICATIONS:  None ENDOSCOPIC IMPRESSION: 1) Diminutive polyp in the rectum 2) Mild diverticulosis 3) Otherwise normal examination RECOMMENDATIONS: 1) Await biopsy results  REPEAT EXAM:  In 10 year(s) for.  ______________________________ Hedwig Morton. Juanda Chance, MD  CC:  n. eSIGNED:   Hedwig Morton. Shaleen Talamantez at 12/14/2011 03:47 PM  [place] Hora, [phone]

## 2011-12-14 NOTE — Progress Notes (Signed)
Patient did not experience any of the following events: a burn prior to discharge; a fall within the facility; wrong site/side/patient/procedure/implant event; or a hospital transfer or hospital admission upon discharge from the facility. (G8907) Patient did not have preoperative order for IV antibiotic SSI prophylaxis. (G8918)  

## 2011-12-14 NOTE — Patient Instructions (Signed)
Please refer to your blue and neon green sheets for instructions regarding diet and activity for the rest of today.  You may resume your medications as you would normally take them.   You will receive a letter in the mail in about two weeks regarding the results of the biopsies taken today.  Colon Polyps A polyp is extra tissue that grows inside your body. Colon polyps grow in the large intestine. The large intestine, also called the colon, is part of your digestive system. It is a long, hollow tube at the end of your digestive tract where your body makes and stores stool. Most polyps are not dangerous. They are benign. This means they are not cancerous. But over time, some types of polyps can turn into cancer. Polyps that are smaller than a pea are usually not harmful. But larger polyps could someday become or may already be cancerous. To be safe, doctors remove all polyps and test them.  WHO GETS POLYPS? Anyone can get polyps, but certain people are more likely than others. You may have a greater chance of getting polyps if:  You are over 50.   You have had polyps before.   Someone in your family has had polyps.   Someone in your family has had cancer of the large intestine.   Find out if someone in your family has had polyps. You may also be more likely to get polyps if you:   Eat a lot of fatty foods.   Smoke.   Drink alcohol.   Do not exercise.   Eat too much.  SYMPTOMS  Most small polyps do not cause symptoms. People often do not know they have one until their caregiver finds it during a regular checkup or while testing them for something else. Some people do have symptoms like these:  Bleeding from the anus. You might notice blood on your underwear or on toilet paper after you have had a bowel movement.   Constipation or diarrhea that lasts more than a week.   Blood in the stool. Blood can make stool look black or it can show up as red streaks in the stool.  If you have  any of these symptoms, see your caregiver. HOW DOES THE DOCTOR TEST FOR POLYPS? The doctor can use four tests to check for polyps:  Digital rectal exam. The caregiver wears gloves and checks your rectum (the last part of the large intestine) to see if it feels normal. This test would find polyps only in the rectum. Your caregiver may need to do one of the other tests listed below to find polyps higher up in the intestine.   Barium enema. The caregiver puts a liquid called barium into your rectum before taking x-rays of your large intestine. Barium makes your intestine look white in the pictures. Polyps are dark, so they are easy to see.   Sigmoidoscopy. With this test, the caregiver can see inside your large intestine. A thin flexible tube is placed into your rectum. The device is called a sigmoidoscope, which has a light and a tiny video camera in it. The caregiver uses the sigmoidoscope to look at the last third of your large intestine.   Colonoscopy. This test is like sigmoidoscopy, but the caregiver looks at all of the large intestine. It usually requires sedation. This is the most common method for finding and removing polyps.  TREATMENT   The caregiver will remove the polyp during sigmoidoscopy or colonoscopy. The polyp is then tested   for cancer.   If you have had polyps, your caregiver may want you to get tested regularly in the future.  PREVENTION  There is not one sure way to prevent polyps. You might be able to lower your risk of getting them if you:  Eat more fruits and vegetables and less fatty food.   Do not smoke.   Avoid alcohol.   Exercise every day.   Lose weight if you are overweight.   Eating more calcium and folate can also lower your risk of getting polyps. Some foods that are rich in calcium are milk, cheese, and broccoli. Some foods that are rich in folate are chickpeas, kidney beans, and spinach.   Aspirin might help prevent polyps. Studies are under way.    Document Released: 08/10/2004 Document Revised: 07/27/2011 Document Reviewed: 01/16/2008 Exit[employer] Patient Information 2012 [employer].  Diverticulosis Diverticulosis is a common condition that develops when small pouches (diverticula) form in the wall of the colon. The risk of diverticulosis increases with age. It happens more often in people who eat a low-fiber diet. Most individuals with diverticulosis have no symptoms. Those individuals with symptoms usually experience abdominal pain, constipation, or loose stools (diarrhea). HOME CARE INSTRUCTIONS   Increase the amount of fiber in your diet as directed by your caregiver or dietician. This may reduce symptoms of diverticulosis.   Your caregiver may recommend taking a dietary fiber supplement.   Drink at least 6 to 8 glasses of water each day to prevent constipation.   Try not to strain when you have a bowel movement.   Your caregiver may recommend avoiding nuts and seeds to prevent complications, although this is still an uncertain benefit.   Only take over-the-counter or prescription medicines for pain, discomfort, or fever as directed by your caregiver.  FOODS WITH HIGH FIBER CONTENT INCLUDE:  Fruits. Apple, peach, pear, tangerine, raisins, prunes.   Vegetables. Brussels sprouts, asparagus, broccoli, cabbage, carrot, cauliflower, romaine lettuce, spinach, summer squash, tomato, winter squash, zucchini.   Starchy Vegetables. Baked beans, kidney beans, lima beans, split peas, lentils, potatoes (with skin).   Grains. Whole wheat bread, brown rice, bran flake cereal, plain oatmeal, white rice, shredded wheat, bran muffins.  SEEK IMMEDIATE MEDICAL CARE IF:   You develop increasing pain or severe bloating.   You have an oral temperature above 102 F (38.9 C), not controlled by medicine.   You develop vomiting or bowel movements that are bloody or black.  Document Released: 08/11/2004 Document Revised: 07/27/2011 Document  Reviewed: 04/14/2010 [employer] Patient Information 2012 [employer]. 

## 2011-12-15 ENCOUNTER — Telehealth: Payer: Self-pay

## 2011-12-15 NOTE — Telephone Encounter (Signed)
Follow up Call- Patient questions:  Do you have a fever, pain , or abdominal swelling? no Pain Score  0 *  Have you tolerated food without any problems? yes  Have you been able to return to your normal activities? yes  Do you have any questions about your discharge instructions: Diet   yes Medications  yes Follow up visit  yes  Do you have questions or concerns about your Care? yes  Actions: * If pain score is 4 or above: No action needed, pain <4.  Dr Juanda Chance pt wants to know if she needs to continue her Carafate and does she need to be on a special diet.  Also she would like to change Nexium to generic or something cheaper if you approve.  She needs prescriptions for both meds if needed.  I will call her back with your response.

## 2011-12-16 ENCOUNTER — Telehealth: Payer: Self-pay | Admitting: Internal Medicine

## 2011-12-16 NOTE — Telephone Encounter (Signed)
Spoke with patient and she is wanting to know if she should continue the Carafate. States she only has a small amount left and no refills. She also asked about a generic Nexium(which I told her is not available) or something less expensive. Please, advise.

## 2011-12-16 NOTE — Telephone Encounter (Signed)
I have already answered that question today from [facility]

## 2011-12-19 NOTE — Telephone Encounter (Signed)
Dr. Juanda Chance, I am not sure what happened to the note you sent Doristine Church but I cannot find it and Marylene Land does not recall getting it. Please, advise again.

## 2011-12-19 NOTE — Telephone Encounter (Signed)
Please call in Prilosec 40mg  po qd, #30, 3 refills, also continue Carafate 1 gm po bid, #40, no refill.

## 2011-12-20 ENCOUNTER — Encounter: Payer: Self-pay | Admitting: *Deleted

## 2011-12-20 ENCOUNTER — Encounter: Payer: Self-pay | Admitting: Internal Medicine

## 2011-12-20 MED ORDER — SUCRALFATE 1 G PO TABS: ORAL_TABLET | ORAL | Status: DC

## 2011-12-20 MED ORDER — OMEPRAZOLE 40 MG PO CPDR: 40.0000 mg | DELAYED_RELEASE_CAPSULE | Freq: Every day | ORAL | Status: DC

## 2011-12-20 NOTE — Telephone Encounter (Signed)
Rx sent to patients pharmacy. Left patient a message with recommendations by Dr. Juanda Chance and that her rx have been sent.

## 2011-12-20 NOTE — Telephone Encounter (Signed)
Dr. Juanda Chance, I received a warning that the risk of severe aluminum toxicity may be increased by co/administration aluminum salts with citrate salts, especially in patients with impaired renal function.(Carafate adn Citracal-D) Do you want to order the carafate?

## 2011-12-20 NOTE — Telephone Encounter (Signed)
Please overrride the warning. The nephrologists use it on HD patients.

## 2011-12-22 ENCOUNTER — Encounter: Payer: Self-pay | Admitting: *Deleted

## 2011-12-27 ENCOUNTER — Ambulatory Visit: Payer: Medicare Other | Admitting: Internal Medicine

## 2012-02-02 ENCOUNTER — Ambulatory Visit (INDEPENDENT_AMBULATORY_CARE_PROVIDER_SITE_OTHER): Payer: Medicare Other | Admitting: Family Medicine

## 2012-02-02 ENCOUNTER — Encounter: Payer: Self-pay | Admitting: Family Medicine

## 2012-02-02 VITALS — BP 120/80 | Temp 97.7°F | Wt 144.0 lb

## 2012-02-02 DIAGNOSIS — E785 Hyperlipidemia, unspecified: Secondary | ICD-10-CM

## 2012-02-02 DIAGNOSIS — N952 Postmenopausal atrophic vaginitis: Secondary | ICD-10-CM | POA: Insufficient documentation

## 2012-02-02 DIAGNOSIS — Z0000 Encounter for general adult medical examination without abnormal findings: Secondary | ICD-10-CM

## 2012-02-02 DIAGNOSIS — R300 Dysuria: Secondary | ICD-10-CM

## 2012-02-02 DIAGNOSIS — M1990 Unspecified osteoarthritis, unspecified site: Secondary | ICD-10-CM

## 2012-02-02 LAB — POCT URINALYSIS DIPSTICK
Bilirubin, UA: NEGATIVE
Blood, UA: NEGATIVE
Glucose, UA: NEGATIVE
Ketones, UA: NEGATIVE
Leukocytes, UA: NEGATIVE
Nitrite, UA: NEGATIVE
Protein, UA: NEGATIVE
Spec Grav, UA: 1.01
Urobilinogen, UA: 0.2
pH, UA: 6

## 2012-02-02 LAB — CBC WITH DIFFERENTIAL/PLATELET
Basophils Absolute: 0 10*3/uL (ref 0.0–0.1)
Basophils Relative: 0.4 % (ref 0.0–3.0)
Eosinophils Absolute: 0.1 10*3/uL (ref 0.0–0.7)
Eosinophils Relative: 2 % (ref 0.0–5.0)
HCT: 42.2 % (ref 36.0–46.0)
Hemoglobin: 14.1 g/dL (ref 12.0–15.0)
Lymphocytes Relative: 33.7 % (ref 12.0–46.0)
Lymphs Abs: 2.4 10*3/uL (ref 0.7–4.0)
MCHC: 33.3 g/dL (ref 30.0–36.0)
MCV: 92.3 fl (ref 78.0–100.0)
Monocytes Absolute: 0.6 10*3/uL (ref 0.1–1.0)
Monocytes Relative: 8.8 % (ref 3.0–12.0)
Neutro Abs: 4 10*3/uL (ref 1.4–7.7)
Neutrophils Relative %: 55.1 % (ref 43.0–77.0)
Platelets: 286 10*3/uL (ref 150.0–400.0)
RBC: 4.57 Mil/uL (ref 3.87–5.11)
RDW: 13.8 % (ref 11.5–14.6)
WBC: 7.2 10*3/uL (ref 4.5–10.5)

## 2012-02-02 LAB — HEPATIC FUNCTION PANEL
ALT: 32 U/L (ref 0–35)
AST: 30 U/L (ref 0–37)
Albumin: 4.6 g/dL (ref 3.5–5.2)
Alkaline Phosphatase: 107 U/L (ref 39–117)
Bilirubin, Direct: 0 mg/dL (ref 0.0–0.3)
Total Bilirubin: 0.3 mg/dL (ref 0.3–1.2)
Total Protein: 7.4 g/dL (ref 6.0–8.3)

## 2012-02-02 LAB — LIPID PANEL
Cholesterol: 284 mg/dL — ABNORMAL HIGH (ref 0–200)
HDL: 94.3 mg/dL (ref >39.00)
Total CHOL/HDL Ratio: 3
Triglycerides: 95 mg/dL (ref 0.0–149.0)
VLDL: 19 mg/dL (ref 0.0–40.0)

## 2012-02-02 LAB — BASIC METABOLIC PANEL
BUN: 21 mg/dL (ref 6–23)
CO2: 30 mEq/L (ref 19–32)
Calcium: 9.7 mg/dL (ref 8.4–10.5)
Chloride: 104 mEq/L (ref 96–112)
Creatinine, Ser: 0.7 mg/dL (ref 0.4–1.2)
GFR: 94.84 mL/min (ref >60.00)
Glucose, Bld: 95 mg/dL (ref 70–99)
Potassium: 5 mEq/L (ref 3.5–5.1)
Sodium: 142 mEq/L (ref 135–145)

## 2012-02-02 LAB — LDL CHOLESTEROL, DIRECT: Direct LDL: 168.5 mg/dL

## 2012-02-02 LAB — TSH: TSH: 0.7 u[IU]/mL (ref 0.35–5.50)

## 2012-02-02 MED ORDER — HYDROCORTISONE 2.5 % RE CREA: TOPICAL_CREAM | Freq: Two times a day (BID) | RECTAL | Status: DC

## 2012-02-02 MED ORDER — ESTRADIOL 0.1 MG/GM VA CREA: 2.0000 g | TOPICAL_CREAM | Freq: Every day | VAGINAL | Status: DC

## 2012-02-02 NOTE — Progress Notes (Signed)
  Subjective:    Patient ID: Dana Chambers, female    DOB: 18-Oct-1946, 66 y.o.   MRN: [id number]  HPI Dana Chambers is a 66 year old female who comes in today for evaluation of suspected UTI she says she's having symptoms for the past week of urgency  She has a lot of post menopausal vaginal dryness. She had a lump which was malignant removed from her breast followed by treatment with radiation 6 years ago.     Review of Systems    general and neurologic review of systems otherwise negative Objective:   Physical Exam Well-developed well-nourished female in no acute distress examination of the abdomen was normal urinalysis was normal       Assessment & Plan:  Urinary tract symptoms of dysuria probably she's experiencing

## 2012-02-02 NOTE — Patient Instructions (Signed)
He is small amounts of the estrogen cream 3 times weekly  You small amounts of the Anusol cream for the rectal irritation  Return sometime in the next 4-8 weeks for general physical exam  Lab work today

## 2012-02-02 NOTE — Progress Notes (Signed)
  Subjective:    Patient ID: Dana Chambers, female    DOB: 01/01/1946, 66 y.o.   MRN: [id number]  HPI Dana Chambers is a 66 year old married female nonsmoker who comes in today concerned she might have a urinary tract infection  For the past week she's noticed some urinary urgency and some low back pain. No fever chills. Her last urinary tract infection was 2 years ago. Today her urinalysis is normal. She does complain of vaginal dryness. She is 6 years post treatment for breast cancer. She'll lumpectomy and postop radiation.  She also has some trouble with rectal itching and would like some cream for that.   Review of Systems General and GYN review of systems otherwise negative    Objective:   Physical Exam  Well-developed well-nourished female in no acute distress a double exam normal external examination of the vulva shows and red and irritated      Assessment & Plan:  Symptoms of UTI but not UTI I think it's more vaginal dryness plan use small amounts of the Premarin vaginal cream 3 times weekly  Rectal itching prescribed Anusol cream

## 2012-02-02 NOTE — Patient Instructions (Signed)
Use small amounts of the hormonal cream 3 times weekly return sometime in the next couple weeks for physical exam

## 2012-03-06 ENCOUNTER — Ambulatory Visit (INDEPENDENT_AMBULATORY_CARE_PROVIDER_SITE_OTHER): Payer: Medicare Other | Admitting: Family Medicine

## 2012-03-06 ENCOUNTER — Encounter: Payer: Self-pay | Admitting: Family Medicine

## 2012-03-06 ENCOUNTER — Other Ambulatory Visit (HOSPITAL_COMMUNITY)
Admission: RE | Admit: 2012-03-06 | Discharge: 2012-03-06 | Disposition: A | Discharge status: Home / Self Care | Payer: Medicare Other | Source: Ambulatory Visit | Attending: Family Medicine | Admitting: Family Medicine

## 2012-03-06 VITALS — BP 118/78 | Temp 97.8°F | Ht 67.0 in | Wt 141.0 lb

## 2012-03-06 DIAGNOSIS — D0590 Unspecified type of carcinoma in situ of unspecified breast: Secondary | ICD-10-CM

## 2012-03-06 DIAGNOSIS — K589 Irritable bowel syndrome without diarrhea: Secondary | ICD-10-CM

## 2012-03-06 DIAGNOSIS — M949 Disorder of cartilage, unspecified: Secondary | ICD-10-CM

## 2012-03-06 DIAGNOSIS — G609 Hereditary and idiopathic neuropathy, unspecified: Secondary | ICD-10-CM

## 2012-03-06 DIAGNOSIS — Z0000 Encounter for general adult medical examination without abnormal findings: Secondary | ICD-10-CM

## 2012-03-06 DIAGNOSIS — N952 Postmenopausal atrophic vaginitis: Secondary | ICD-10-CM

## 2012-03-06 DIAGNOSIS — J301 Allergic rhinitis due to pollen: Secondary | ICD-10-CM

## 2012-03-06 DIAGNOSIS — M899 Disorder of bone, unspecified: Secondary | ICD-10-CM

## 2012-03-06 DIAGNOSIS — G629 Polyneuropathy, unspecified: Secondary | ICD-10-CM | POA: Insufficient documentation

## 2012-03-06 DIAGNOSIS — Z23 Encounter for immunization: Secondary | ICD-10-CM

## 2012-03-06 DIAGNOSIS — Z136 Encounter for screening for cardiovascular disorders: Secondary | ICD-10-CM

## 2012-03-06 DIAGNOSIS — K219 Gastro-esophageal reflux disease without esophagitis: Secondary | ICD-10-CM

## 2012-03-06 DIAGNOSIS — Z124 Encounter for screening for malignant neoplasm of cervix: Secondary | ICD-10-CM | POA: Insufficient documentation

## 2012-03-06 MED ORDER — FLUTICASONE PROPIONATE 50 MCG/ACT NA SUSP: NASAL | Status: DC

## 2012-03-06 MED ORDER — OMEPRAZOLE 40 MG PO CPDR: 40.0000 mg | DELAYED_RELEASE_CAPSULE | Freq: Every day | ORAL | Status: DC

## 2012-03-06 MED ORDER — AMITRIPTYLINE HCL 25 MG PO TABS: ORAL_TABLET | ORAL | Status: DC

## 2012-03-06 NOTE — Patient Instructions (Signed)
Take one Elavil tablet at bedtime for the peripheral neuropathy  Return in 6 weeks for followup  If the 1 tablet make u  too sleepy the next day then cut it in half  Motrin 400 mg twice daily with food for joint pain  Steroid nasal spray one shot each nostril at bedtime

## 2012-03-06 NOTE — Progress Notes (Signed)
Subjective:    Patient ID: Dana Chambers, female    DOB: 1945-12-15, 66 y.o.   MRN: [id number]  HPI Dana Chambers is a 66 year old married female nonsmoker who comes in today for a Medicare wellness examination  In 2006 she had a lumpectomy right breast followed by postop radiation she's been disease free since that time. She sees Dr. Donnie Coffin her oncologist on a regular basis  She has a history of reflux esophagitis and takes Prilosec 40 mg daily  She has mild osteoarthritis and I recommend Motrin 400 mg twice a day with food  She has a history of osteopenia takes calcium vitamin D and walks on a regular basis.  She has a history of postmenopausal vaginal dryness and is reluctant to use the estrogen cream as  She recently had an ear infection her right ear Dr. Ezzard Standing did a tympanostomy and put her on Augmentin. She had severe GI side effects and the omentum and you switched her to Levaquin.  She recently had a colonoscopy by Dr. Dickie La one polyp otherwise: Normal.  She gets routine eye care, hearing normal, regular dental care, BSE monthly, and you mammography, colonoscopy as above, vaccinations tetanus and Pneumovax today to update vaccinations, seasonal flu shot 2012, information given on shingles  Cognitive function normal she walks on a regular basis home health safety reviewed no issues identified no guns in the house and she does have a health care power of attorney and living well   Review of Systems  Constitutional: Negative.   HENT: Negative.   Eyes: Negative.   Respiratory: Negative.   Cardiovascular: Negative.   Gastrointestinal: Negative.   Genitourinary: Negative.   Musculoskeletal: Negative.   Neurological: Positive for numbness.  Hematological: Negative.   Psychiatric/Behavioral: Negative.        Objective:   Physical Exam  Constitutional: She appears well-developed and well-nourished.  HENT:  Head: Normocephalic and atraumatic.  Right Ear: External ear normal.    Left Ear: External ear normal.  Nose: Nose normal.  Mouth/Throat: Oropharynx is clear and moist.  Eyes: EOM are normal. Pupils are equal, round, and reactive to light.  Neck: Normal range of motion. Neck supple. No thyromegaly present.  Cardiovascular: Normal rate, regular rhythm, normal heart sounds and intact distal pulses.  Exam reveals no gallop and no friction rub.   No murmur heard. Pulmonary/Chest: Effort normal and breath sounds normal.  Abdominal: Soft. Bowel sounds are normal. She exhibits no distension and no mass. There is no tenderness. There is no rebound.  Genitourinary: Vagina normal and uterus normal. Guaiac negative stool. No vaginal discharge found.       Bilateral breast exam left breast normal right breast scar from previous surgery and the nipple was removed. No palpable abnormality  Musculoskeletal: Normal range of motion.  Lymphadenopathy:    She has no cervical adenopathy.  Neurological: She is alert. She has normal reflexes. No cranial nerve deficit. She exhibits normal muscle tone. Coordination normal.  Skin: Skin is warm and dry.       Total body skin exam normal  Psychiatric: She has a normal mood and affect. Her behavior is normal. Judgment and thought content normal.          Assessment & Plan:  Healthy female  Postmenopausal vaginal dryness recommend small amounts of hormonal cream twice weekly patient declines  Reflux esophagitis Prilosec 40 mg daily  Osteopenia calcium exercise vitamin D and walking  Status post breast cancer right 2006 annual followup by oncology  Neuropathy Elavil 25 mg each bedtime followup in 6 weeks  Osteo-arthritis Motrin 400 mg twice a day with food

## 2012-03-15 ENCOUNTER — Telehealth: Payer: Self-pay | Admitting: *Deleted

## 2012-03-15 MED ORDER — FLUCONAZOLE 150 MG PO TABS: 150.0000 mg | ORAL_TABLET | Freq: Once | ORAL | Status: AC

## 2012-03-15 NOTE — Telephone Encounter (Signed)
150 mg RF 1

## 2012-03-15 NOTE — Telephone Encounter (Signed)
Pt. Took Levaquin from her ENT, and now has a vaginal yeast infection.  Is asking for Diflucan to be called to her pharmacy.

## 2012-04-17 ENCOUNTER — Ambulatory Visit: Payer: Medicare Other | Admitting: Family Medicine

## 2012-06-04 ENCOUNTER — Telehealth: Payer: Self-pay | Admitting: Internal Medicine

## 2012-06-04 NOTE — Telephone Encounter (Signed)
Please start Bentyl 10 mg po bid, #40, 1 refill

## 2012-06-04 NOTE — Telephone Encounter (Signed)
Pt had ECL 12/14/11 with COLON revealing mild diverticulosis and hyperplastic polyp; EGD showed mild gastritis and comment questioned NSAID use vs IBS for the abdominal pain. Pt reports for the last 3 weeks she has experienced discomfort under her l rib that resembles a sharp twinge and increased gas and rumbling in her stomach. She reports an occasional loose stool, but denies a fever. She had some leftover carafate and she started that and she takes daily prilosec. Pt does not know if she needs to be seen since the problems are not continuous. Please advise. Thanks.

## 2012-06-05 MED ORDER — DICYCLOMINE HCL 10 MG PO CAPS: 10.0000 mg | ORAL_CAPSULE | Freq: Two times a day (BID) | ORAL | Status: DC

## 2012-06-05 NOTE — Telephone Encounter (Signed)
Informed pt that Dr Juanda Chance would like for her to try Bentyl twice daily to see if it helps; pt stated understanding.

## 2012-06-05 NOTE — Telephone Encounter (Signed)
lmom for pt to call back

## 2012-06-18 ENCOUNTER — Other Ambulatory Visit (INDEPENDENT_AMBULATORY_CARE_PROVIDER_SITE_OTHER): Payer: Medicare Other

## 2012-06-18 ENCOUNTER — Telehealth: Payer: Self-pay | Admitting: Internal Medicine

## 2012-06-18 DIAGNOSIS — R1011 Right upper quadrant pain: Secondary | ICD-10-CM

## 2012-06-18 LAB — CREATININE, SERUM: Creatinine, Ser: 0.8 mg/dL (ref 0.4–1.2)

## 2012-06-18 LAB — BUN: BUN: 15 mg/dL (ref 6–23)

## 2012-06-18 MED ORDER — TRAMADOL HCL 50 MG PO TABS: 50.0000 mg | ORAL_TABLET | Freq: Four times a day (QID) | ORAL | Status: AC | PRN

## 2012-06-18 NOTE — Telephone Encounter (Signed)
Please obtain CT scan of the abd and pelvis with IV and oral contrast, last exam 2007- liver lesion "RUQ abd. Pain, follow up liver lesion" Also send Ttramadol 50 mg #30 1 po q 6 hrs prn pain, 1 refill

## 2012-06-18 NOTE — Telephone Encounter (Signed)
Rx sent. Scheduled CT abd, pelvis on 06/21/12 at 10:00 AM at [facility] CT. NPO 4 hours prior, Drink contrast 2 and 1 hour prior. Instructions and contrast up front for pick up. Patient to come and get  BUN, Crea drawn also. Patient will come today for labs and to pick up information

## 2012-06-18 NOTE — Telephone Encounter (Signed)
Patient calling to report the Bentyl BID is not helping with the pain, tenderness underneath rib cage, bloating and gas. She states she has had these symptoms for about 5 weeks now since she started water aerobics.. She is also taking Prilosec 40 mg Daily. She has some Carafate on hand and wonders if she should try this. She is going out of town next week and is worried about these symptoms continuing. Last ECOL- 12/14/11- colon-mild diverticulosis, hyperplastic polyp Endo- mild gastritis, ?NSIAD use vs IBS for the abdominal pain. Please, advise.

## 2012-06-21 ENCOUNTER — Ambulatory Visit (INDEPENDENT_AMBULATORY_CARE_PROVIDER_SITE_OTHER)
Admission: RE | Admit: 2012-06-21 | Discharge: 2012-06-21 | Disposition: A | Discharge status: Home / Self Care | Payer: Medicare Other | Source: Ambulatory Visit | Attending: Internal Medicine | Admitting: Internal Medicine

## 2012-06-21 DIAGNOSIS — R1011 Right upper quadrant pain: Secondary | ICD-10-CM

## 2012-06-21 MED ORDER — IOHEXOL 300 MG/ML  SOLN
100.0000 mL | Freq: Once | INTRAMUSCULAR | Status: AC | PRN
  Administered 2012-06-21: 100 mL via INTRAVENOUS

## 2012-06-22 ENCOUNTER — Telehealth: Payer: Self-pay | Admitting: Oncology

## 2012-06-22 NOTE — Telephone Encounter (Signed)
Mosaiq orders were under name Harriette S. Buehl DOB Nov 08, 2046 WUJ#[id number]. S/w Renee and the person the patient is the same and these files have been merged. lmonvm for pt re appts for 9/26 and 10/3. Schedule mailed.

## 2012-06-24 ENCOUNTER — Other Ambulatory Visit: Payer: Self-pay | Admitting: Internal Medicine

## 2012-06-25 ENCOUNTER — Encounter: Payer: Self-pay | Admitting: Gynecology

## 2012-06-25 ENCOUNTER — Ambulatory Visit (INDEPENDENT_AMBULATORY_CARE_PROVIDER_SITE_OTHER): Payer: Medicare Other | Admitting: Gynecology

## 2012-06-25 VITALS — BP 120/74 | Ht 67.0 in | Wt 144.0 lb

## 2012-06-25 DIAGNOSIS — D259 Leiomyoma of uterus, unspecified: Secondary | ICD-10-CM

## 2012-06-25 DIAGNOSIS — C50919 Malignant neoplasm of unspecified site of unspecified female breast: Secondary | ICD-10-CM

## 2012-06-25 DIAGNOSIS — R9389 Abnormal findings on diagnostic imaging of other specified body structures: Secondary | ICD-10-CM

## 2012-06-25 DIAGNOSIS — N952 Postmenopausal atrophic vaginitis: Secondary | ICD-10-CM

## 2012-06-25 NOTE — Patient Instructions (Signed)
Follow up for ultrasound as scheduled 

## 2012-06-25 NOTE — Progress Notes (Signed)
66 year old G1 P1 new patient former patient of Dr. Kyra Manges presents having recently been evaluated by Dr. Juanda Chance for left upper abdominal pain and had a CT scan which on pelvic report stated:  "Uterine fibroids include a heavily calcified 2.3 cm left uterine body fibroid. There is irregularity of the endometrium which is thickened up to 1.4 cm and somewhat heterogeneous. No adnexal mass noted." Patient was referred for evaluation of the thickened endometrium. She does have a history in the past of both cervical polyps and endometrial polyps most recently in 2011 showing a benign endometrial polyp. She has been on tamoxifen in the past secondary to her history of breast cancer treated with lumpectomy in 2006. She stopped her tamoxifen several years ago. She also has a history of osteopenia most recent DEXA this past year which I do not have a copy of. She does give a history of being on Fosamax/Actonel transiently but has not been on this for several years also. Her most recent exam to include breast pelvic with Pap smear was by Dr. Tawanna Cooler in April and she had a normal Pap smear. She did have an ASCUS negative high risk HPV Pap smear in 2011 without history of abnormal Pap smears otherwise.  Exam was can assist Spine straight without CVA tenderness Abdomen soft nontender without masses guarding rebound organomegaly Pelvic external BUS vagina with atrophic changes. Cervix normal. Uterus grossly normal size midline mobile nontender. Adnexa without masses or tenderness. Rectovaginal exam is normal.  Assessment and plan: 1. Abdominal pain left upper quadrant is improving. He is being treated with several medications for irritable bowel by Dr. Juanda Chance. CT is otherwise unremarkable from a GI standpoint. 2. Thickened endometrium at 10.4 cm. History of tamoxifen use and prior endometrial/cervical polyps. Exam today is normal.  Recommend sonohysterogram and patient will schedule. Various scenarios to include no  pathology/endometrial polyps/submucous myomas reviewed. 3. Osteopenia. -1.9 on DEXA 2010. Reports a more recent DEXA which we will get a copy of. The issues of treatment versus nontreatment and the use of FRAX discussed with her. We'll further discuss after receiving a copy of the DEXA.

## 2012-07-11 ENCOUNTER — Ambulatory Visit (INDEPENDENT_AMBULATORY_CARE_PROVIDER_SITE_OTHER): Payer: Medicare Other | Admitting: Gynecology

## 2012-07-11 ENCOUNTER — Encounter: Payer: Self-pay | Admitting: Gynecology

## 2012-07-11 ENCOUNTER — Ambulatory Visit (INDEPENDENT_AMBULATORY_CARE_PROVIDER_SITE_OTHER): Payer: Medicare Other

## 2012-07-11 ENCOUNTER — Other Ambulatory Visit: Payer: Self-pay | Admitting: Gynecology

## 2012-07-11 DIAGNOSIS — M8580 Other specified disorders of bone density and structure, unspecified site: Secondary | ICD-10-CM

## 2012-07-11 DIAGNOSIS — M949 Disorder of cartilage, unspecified: Secondary | ICD-10-CM

## 2012-07-11 DIAGNOSIS — N840 Polyp of corpus uteri: Secondary | ICD-10-CM

## 2012-07-11 DIAGNOSIS — N83339 Acquired atrophy of ovary and fallopian tube, unspecified side: Secondary | ICD-10-CM

## 2012-07-11 DIAGNOSIS — M899 Disorder of bone, unspecified: Secondary | ICD-10-CM

## 2012-07-11 DIAGNOSIS — D259 Leiomyoma of uterus, unspecified: Secondary | ICD-10-CM

## 2012-07-11 DIAGNOSIS — D251 Intramural leiomyoma of uterus: Secondary | ICD-10-CM

## 2012-07-11 DIAGNOSIS — D250 Submucous leiomyoma of uterus: Secondary | ICD-10-CM

## 2012-07-11 DIAGNOSIS — R9389 Abnormal findings on diagnostic imaging of other specified body structures: Secondary | ICD-10-CM

## 2012-07-11 NOTE — Progress Notes (Signed)
Patient presents with 2 issues: 1. follow up on bone density discussion. 2. thickened endometrial echo on CT scan done for workup of left upper abdominal pain. Scheduled for sonohysterogram today. Past use of tamoxifen and history of endometrial polyps.  Ultrasound shows overall uterine size normal. Multiple small myomas largest measuring 34 mm. Endometrial echo 24.6 mm. Right and left ovaries visualized and normal/postmenopausal. Sonohysterogram performed, sterile technique, easy catheter introduction with good distention and large endometrial polyp filling the cavity measuring 35 x 19 x 25 mm. Smaller anterior lower uterine segment myoma abutting against the canal.  Assessment and plan: 1. Large endometrial polyp/multiple small myomas. Review situation with the patient and the need to remove the polyp rule out hyperplasia/early uterine cancer. I discussed was involved with hysteroscopy D&C to include instrumentation, use of the resectoscope and D&C portion. Risks of infection, hemorrhage necessitating transfusion, uterine perforation or transuterine thermal damage to bowel bladder ureters vessels and nerves necessitating major exploratory reparative surgeries and future reparative surgeries including bowel resection bladder repair ureteral damage repair ostomy formation all reviewed with her. The risk of distended media absorption leading to metabolic complications such as coma and seizures was also discussed. The patient's questions were answered to her satisfaction and she wants to go ahead and schedule this and we'll move towards scheduling surgery.I 2. Osteopenia. I reviewed her DEXA from 2012 which showed T score -2.2. There was a statistically significant decline at this time but that did not give the T score numbers. She had been on a bisphosphonate overall for about 10 years and now is on a drug-free holiday. I deferred to Dr. Tawanna Cooler as far as recommendations to treat now or not but given the total  picture my recommendation would be to monitor her, rescan her in 2 years and then go from there. Patient's comfortable with that recommendation.

## 2012-07-11 NOTE — Patient Instructions (Signed)
Office will contact you to schedule hysteroscopy D&C

## 2012-07-13 ENCOUNTER — Other Ambulatory Visit: Payer: Self-pay | Admitting: Gynecology

## 2012-07-13 MED ORDER — MISOPROSTOL 200 MCG PO TABS: ORAL_TABLET | ORAL | Status: DC

## 2012-07-16 ENCOUNTER — Other Ambulatory Visit: Payer: Self-pay | Admitting: Oncology

## 2012-07-16 DIAGNOSIS — Z853 Personal history of malignant neoplasm of breast: Secondary | ICD-10-CM

## 2012-07-27 ENCOUNTER — Encounter (HOSPITAL_BASED_OUTPATIENT_CLINIC_OR_DEPARTMENT_OTHER): Payer: Self-pay | Admitting: *Deleted

## 2012-07-31 ENCOUNTER — Encounter (HOSPITAL_BASED_OUTPATIENT_CLINIC_OR_DEPARTMENT_OTHER): Payer: Self-pay | Admitting: *Deleted

## 2012-07-31 NOTE — H&P (Signed)
Dana Chambers 11-Feb-1946 [id number]   History and Physical  Chief complaint: endometrial polyp, leiomyoma  History of present illness: 66 y.o. G1P1001 having recently been evaluated by Dr. Juanda Chance for left upper abdominal pain, had a CT scan which on pelvic report stated:  "Uterine fibroids include a heavily calcified 2.3 cm left uterine body fibroid. There is irregularity of the endometrium which is thickened up to 1.4 cm and somewhat heterogeneous. No adnexal mass noted." Follow up ultrasound showed overall uterine size normal. Multiple small myomas largest measuring 34 mm. Endometrial echo 24.6 mm. Right and left ovaries visualized and normal/postmenopausal. Sonohysterogram performed, sterile technique, easy catheter introduction with good distention and large endometrial polyp filling the cavity measuring 35 x 19 x 25 mm. Smaller anterior lower uterine segment myoma abutting against the canal. Endometrial sample showed no endometrial tissue. She does have a history in the past of both cervical polyps and endometrial polyps most recently in 2011 showing a benign endometrial polyp. She has been on tamoxifen in the past secondary to her history of breast cancer treated with lumpectomy in 2006. She stopped her tamoxifen several years ago.  Patient is admitted for hysteroscopy D&C.    Patient was referred for evaluation of the thickened endometrium. She does have a history in the past of both cervical polyps and endometrial polyps most recently in 2011 showing a benign endometrial polyp. She has been on tamoxifen in the past secondary to her history of breast cancer treated with lumpectomy in 2006.   Past medical history,surgical history, medications, allergies, family history and social history were all reviewed and documented in the EPIC chart. ROS:  Was performed and pertinent positives and negatives are included in the history of present illness.  Exam: General: well developed, well nourished  female, no acute distress Abdomen: soft, nontender without masses, guarding, rebound, organomegaly  Pelvic: external bus vagina: normal  with atrophic changes Cervix: grossly normal  Uterus: normal size, midline and mobile, nontender  Adnexa: without masses or tenderness     Assessment/Plan:  66 y.o. G1P1001 with large endometrial polyp/multiple small myomas. Review situation with the patient and the need to remove the polyp rule out hyperplasia/early uterine cancer. She does have a history in the past of both cervical polyps and endometrial polyps most recently in 2011 showing a benign endometrial polyp. She has been on tamoxifen in the past secondary to her history of breast cancer treated with lumpectomy in 2006. She stopped her tamoxifen several years ago.  I discussed what is involved with hysteroscopy D&C to include instrumentation, use of the resectoscope and D&C portion. Risks of infection, hemorrhage necessitating transfusion, uterine perforation or transuterine thermal damage to bowel bladder ureters vessels and nerves necessitating major exploratory reparative surgeries and future reparative surgeries including bowel resection bladder repair ureteral damage repair ostomy formation all reviewed with her. The risk of distended media absorption leading to metabolic complications such as coma and seizures was also discussed. The patient's questions were answered to her satisfaction and she is ready to proceed with surgery.    Dara Lords MD, 3:01 PM 07/31/2012

## 2012-07-31 NOTE — Progress Notes (Signed)
NPO AFTER MN. ARRIVES AT 0615. CURRENT EKG IN EPIC AND CHART. LAB WORK TO BE DONE THIS WEEK BEFORE DOS. WILL TAKE PRILOSEC AM OF SURG W/ SIP OF WATER.

## 2012-08-01 LAB — CBC
HCT: 40.2 % (ref 36.0–46.0)
Hemoglobin: 13.9 g/dL (ref 12.0–15.0)
MCH: 30.3 pg (ref 26.0–34.0)
MCHC: 34.6 g/dL (ref 30.0–36.0)
MCV: 87.6 fL (ref 78.0–100.0)
Platelets: 299 10*3/uL (ref 150–400)
RBC: 4.59 MIL/uL (ref 3.87–5.11)
RDW: 13.1 % (ref 11.5–15.5)
WBC: 7.7 10*3/uL (ref 4.0–10.5)

## 2012-08-01 LAB — COMPREHENSIVE METABOLIC PANEL
ALT: 23 U/L (ref 0–35)
AST: 30 U/L (ref 0–37)
Albumin: 4 g/dL (ref 3.5–5.2)
Alkaline Phosphatase: 138 U/L — ABNORMAL HIGH (ref 39–117)
BUN: 18 mg/dL (ref 6–23)
CO2: 29 mEq/L (ref 19–32)
Calcium: 10 mg/dL (ref 8.4–10.5)
Chloride: 100 mEq/L (ref 96–112)
Creatinine, Ser: 0.83 mg/dL (ref 0.50–1.10)
GFR calc Af Amer: 83 mL/min — ABNORMAL LOW (ref >90)
GFR calc non Af Amer: 71 mL/min — ABNORMAL LOW (ref >90)
Glucose, Bld: 120 mg/dL — ABNORMAL HIGH (ref 70–99)
Potassium: 4.5 mEq/L (ref 3.5–5.1)
Sodium: 138 mEq/L (ref 135–145)
Total Bilirubin: 0.2 mg/dL — ABNORMAL LOW (ref 0.3–1.2)
Total Protein: 7.2 g/dL (ref 6.0–8.3)

## 2012-08-03 ENCOUNTER — Encounter (HOSPITAL_BASED_OUTPATIENT_CLINIC_OR_DEPARTMENT_OTHER): Payer: Self-pay | Admitting: Anesthesiology

## 2012-08-03 ENCOUNTER — Encounter (HOSPITAL_BASED_OUTPATIENT_CLINIC_OR_DEPARTMENT_OTHER)
Admission: RE | Disposition: A | Discharge status: Home / Self Care | Payer: Self-pay | Source: Ambulatory Visit | Attending: Gynecology

## 2012-08-03 ENCOUNTER — Ambulatory Visit (HOSPITAL_BASED_OUTPATIENT_CLINIC_OR_DEPARTMENT_OTHER)
Admission: RE | Admit: 2012-08-03 | Discharge: 2012-08-03 | Disposition: A | Discharge status: Home / Self Care | Payer: Medicare Other | Source: Ambulatory Visit | Attending: Gynecology | Admitting: Gynecology

## 2012-08-03 ENCOUNTER — Encounter (HOSPITAL_BASED_OUTPATIENT_CLINIC_OR_DEPARTMENT_OTHER): Payer: Self-pay | Admitting: *Deleted

## 2012-08-03 ENCOUNTER — Ambulatory Visit (HOSPITAL_BASED_OUTPATIENT_CLINIC_OR_DEPARTMENT_OTHER): Payer: Medicare Other | Admitting: Anesthesiology

## 2012-08-03 DIAGNOSIS — D250 Submucous leiomyoma of uterus: Secondary | ICD-10-CM | POA: Insufficient documentation

## 2012-08-03 DIAGNOSIS — N840 Polyp of corpus uteri: Secondary | ICD-10-CM

## 2012-08-03 DIAGNOSIS — Z853 Personal history of malignant neoplasm of breast: Secondary | ICD-10-CM | POA: Insufficient documentation

## 2012-08-03 DIAGNOSIS — D259 Leiomyoma of uterus, unspecified: Secondary | ICD-10-CM

## 2012-08-03 HISTORY — DX: Personal history of urinary calculi: Z87.442

## 2012-08-03 HISTORY — DX: Hereditary and idiopathic neuropathy, unspecified: G60.9

## 2012-08-03 HISTORY — DX: Personal history of malignant neoplasm of breast: Z85.3

## 2012-08-03 HISTORY — DX: Other hemorrhoids: K64.8

## 2012-08-03 HISTORY — DX: Unspecified osteoarthritis, unspecified site: M19.90

## 2012-08-03 HISTORY — PX: HYSTEROSCOPY WITH D & C: SHX1775

## 2012-08-03 HISTORY — DX: Calculus of kidney: N20.0

## 2012-08-03 SURGERY — DILATATION AND CURETTAGE /HYSTEROSCOPY
Anesthesia: General | Site: Uterus | Wound class: Clean Contaminated

## 2012-08-03 MED ORDER — MIDAZOLAM HCL 5 MG/5ML IJ SOLN
INTRAMUSCULAR | Status: DC | PRN
  Administered 2012-08-03: 2 mg via INTRAVENOUS

## 2012-08-03 MED ORDER — OXYCODONE HCL 5 MG/5ML PO SOLN: 5.0000 mg | Freq: Once | ORAL | Status: DC | PRN

## 2012-08-03 MED ORDER — DEXAMETHASONE SODIUM PHOSPHATE 4 MG/ML IJ SOLN
INTRAMUSCULAR | Status: DC | PRN
  Administered 2012-08-03: 10 mg via INTRAVENOUS

## 2012-08-03 MED ORDER — FENTANYL CITRATE 0.05 MG/ML IJ SOLN
INTRAMUSCULAR | Status: DC | PRN
  Administered 2012-08-03 (×2): 25 ug via INTRAVENOUS

## 2012-08-03 MED ORDER — LACTATED RINGERS IV SOLN: INTRAVENOUS | Status: DC

## 2012-08-03 MED ORDER — LIDOCAINE HCL 1 % IJ SOLN
INTRAMUSCULAR | Status: DC | PRN
  Administered 2012-08-03: 10 mL

## 2012-08-03 MED ORDER — HYDROMORPHONE HCL PF 1 MG/ML IJ SOLN: 0.2500 mg | INTRAMUSCULAR | Status: DC | PRN

## 2012-08-03 MED ORDER — KETOROLAC TROMETHAMINE 30 MG/ML IJ SOLN
INTRAMUSCULAR | Status: DC | PRN
  Administered 2012-08-03: 15 mg via INTRAVENOUS

## 2012-08-03 MED ORDER — ACETAMINOPHEN 10 MG/ML IV SOLN
INTRAVENOUS | Status: DC | PRN
  Administered 2012-08-03: 1000 mg via INTRAVENOUS

## 2012-08-03 MED ORDER — PROPOFOL 10 MG/ML IV BOLUS
INTRAVENOUS | Status: DC | PRN
  Administered 2012-08-03: 150 mg via INTRAVENOUS

## 2012-08-03 MED ORDER — ONDANSETRON HCL 4 MG/2ML IJ SOLN
INTRAMUSCULAR | Status: DC | PRN
  Administered 2012-08-03: 4 mg via INTRAVENOUS

## 2012-08-03 MED ORDER — MEPERIDINE HCL 25 MG/ML IJ SOLN: 6.2500 mg | INTRAMUSCULAR | Status: DC | PRN

## 2012-08-03 MED ORDER — PROMETHAZINE HCL 25 MG/ML IJ SOLN: 6.2500 mg | INTRAMUSCULAR | Status: DC | PRN

## 2012-08-03 MED ORDER — LACTATED RINGERS IV SOLN
INTRAVENOUS | Status: DC
  Administered 2012-08-03: 100 mL/h via INTRAVENOUS

## 2012-08-03 MED ORDER — DEXTROSE 5 % IV SOLN
2.0000 g | INTRAVENOUS | Status: AC
  Administered 2012-08-03: 2 g via INTRAVENOUS

## 2012-08-03 MED ORDER — OXYCODONE HCL 5 MG PO TABS: 5.0000 mg | ORAL_TABLET | Freq: Once | ORAL | Status: DC | PRN

## 2012-08-03 MED ORDER — ACETAMINOPHEN 10 MG/ML IV SOLN: 1000.0000 mg | Freq: Once | INTRAVENOUS | Status: DC | PRN

## 2012-08-03 MED ORDER — OXYCODONE-ACETAMINOPHEN 5-325 MG PO TABS: 1.0000 | ORAL_TABLET | ORAL | Status: AC | PRN

## 2012-08-03 MED ORDER — LIDOCAINE HCL (CARDIAC) 20 MG/ML IV SOLN
INTRAVENOUS | Status: DC | PRN
  Administered 2012-08-03: 60 mg via INTRAVENOUS

## 2012-08-03 MED ORDER — GLYCINE 1.5 % IR SOLN
Status: DC | PRN
  Administered 2012-08-03: 3000 mL

## 2012-08-03 SURGICAL SUPPLY — 33 items
BAG DECANTER FOR FLEXI CONT (MISCELLANEOUS) IMPLANT
CANISTER SUCTION 2500CC (MISCELLANEOUS) ×2 IMPLANT
CATH ROBINSON RED A/P 16FR (CATHETERS) ×2 IMPLANT
CLOTH BEACON ORANGE TIMEOUT ST (SAFETY) ×2 IMPLANT
CORD ACTIVE DISPOSABLE (ELECTRODE) ×1
CORD ELECTRO ACTIVE DISP (ELECTRODE) ×1 IMPLANT
COVER TABLE BACK 60X90 (DRAPES) ×2 IMPLANT
DRAPE CAMERA CLOSED 9X96 (DRAPES) ×2 IMPLANT
DRAPE LG THREE QUARTER DISP (DRAPES) ×2 IMPLANT
DRESSING TELFA 8X3 (GAUZE/BANDAGES/DRESSINGS) ×2 IMPLANT
ELECT LOOP GYNE PRO 24FR (CUTTING LOOP) ×2
ELECT REM PT RETURN 9FT ADLT (ELECTROSURGICAL) ×2
ELECT VAPORTRODE GRVD BAR (ELECTRODE) IMPLANT
ELECTRODE LOOP GYNE PRO 24FR (CUTTING LOOP) ×1 IMPLANT
ELECTRODE REM PT RTRN 9FT ADLT (ELECTROSURGICAL) ×1 IMPLANT
GLOVE BIO SURGEON STRL SZ7.5 (GLOVE) ×4 IMPLANT
GOWN W/COTTON TOWEL STD LRG (GOWNS) ×2 IMPLANT
GOWN XL W/COTTON TOWEL STD (GOWNS) ×2 IMPLANT
JUMPSUIT BLUE BOOT COVER DISP (PROTECTIVE WEAR) ×2 IMPLANT
LEGGING LITHOTOMY PAIR STRL (DRAPES) ×2 IMPLANT
NDL SAFETY ECLIPSE 18X1.5 (NEEDLE) IMPLANT
NDL SPNL 22GX3.5 QUINCKE BK (NEEDLE) ×1 IMPLANT
NEEDLE HYPO 18GX1.5 SHARP (NEEDLE)
NEEDLE SPNL 22GX3.5 QUINCKE BK (NEEDLE) ×2 IMPLANT
PACK BASIN DAY SURGERY FS (CUSTOM PROCEDURE TRAY) ×2 IMPLANT
PAD OB MATERNITY 4.3X12.25 (PERSONAL CARE ITEMS) ×2 IMPLANT
PAD PREP 24X48 CUFFED NSTRL (MISCELLANEOUS) ×2 IMPLANT
SYR CONTROL 10ML LL (SYRINGE) ×2 IMPLANT
SYR TB 1ML LL NO SAFETY (SYRINGE) IMPLANT
TOWEL OR 17X24 6PK STRL BLUE (TOWEL DISPOSABLE) ×4 IMPLANT
TRAY DSU PREP LF (CUSTOM PROCEDURE TRAY) ×2 IMPLANT
TUBING HYDROFLEX HYSTEROSCOPY (TUBING) ×2 IMPLANT
WATER STERILE IRR 500ML POUR (IV SOLUTION) ×2 IMPLANT

## 2012-08-03 NOTE — H&P (Signed)
  The patient was examined.  Heent: normal  Lungs clear Cardiac RR without RMG I reviewed the proposed surgery and consent form with the patient.  The dictated history and physical is current and accurate and all questions were answered. The patient is ready to proceed with surgery and has a realistic understanding and expectation for the outcome.   Dara Lords MD, 7:17 AM 08/03/2012

## 2012-08-03 NOTE — Anesthesia Procedure Notes (Signed)
Procedure Name: LMA Insertion Date/Time: 08/03/2012 7:24 AM Performed by: Maris Berger T Pre-anesthesia Checklist: Patient identified, Emergency Drugs available, Suction available and Patient being monitored Patient Re-evaluated:Patient Re-evaluated prior to inductionOxygen Delivery Method: Circle System Utilized Preoxygenation: Pre-oxygenation with 100% oxygen Intubation Type: IV induction Ventilation: Mask ventilation without difficulty LMA: LMA inserted LMA Size: 4.0 Number of attempts: 1 Placement Confirmation: positive ETCO2 Dental Injury: Teeth and Oropharynx as per pre-operative assessment  Comments: Gauze roll between teeth

## 2012-08-03 NOTE — Anesthesia Postprocedure Evaluation (Signed)
Anesthesia Post Note  Patient: Dana Chambers  Procedure(s) Performed: Procedure(s) (LRB): DILATATION AND CURETTAGE /HYSTEROSCOPY (N/A)  Anesthesia type: General  Patient location: PACU  Post pain: Pain level controlled  Post assessment: Post-op Vital signs reviewed  Last Vitals: BP 134/74  Pulse 80  Temp 36 C (Oral)  Resp 16  Ht 5\' 7"  (1.702 m)  Wt 143 lb 5 oz (65.006 kg)  BMI 22.45 kg/m2  SpO2 97%  Post vital signs: Reviewed  Level of consciousness: sedated  Complications: No apparent anesthesia complications

## 2012-08-03 NOTE — Op Note (Signed)
Dana Chambers [birth year] [id number]   Post Operative Note   Date of surgery:  08/03/2012  Pre Op Dx:  Endometrial polyp, submucous leiomyoma  Post Op Dx:  Endometrial polyp, submucous leiomyoma  Procedure:  Hysteroscopic resection submucous myoma, endometrial polyp, endometrial curettage  Surgeon:  Colin Broach P  Anesthesia:  General  EBL:  minimal  Distended media discrepancy:  125 cc  Complications:  None  Specimen:  #1 endometrial polyp #2 submucous myoma fragments #3 endometrial curetting to pathology  Findings: EUA:  External BUS vagina with mild atrophic changes. Cervix normal. Uterus normal size midline mobile. Adnexa without masses   Hysteroscopy: Small submucous myoma left lower uterine segment/upper cervical canal, resected. Large endometrial polyp filling the cavity, totally resected. The endometrial cavity otherwise normal with atrophic appearing endometrium. Fundus anterior/posterior uterine surfaces, lower uterine segment, endocervical canal, right and left tubal ostia all visualized.  Procedure:  The patient was taken to the operating room, underwent general anesthesia, was placed in the low dorsal lithotomy position, received a perineal/vaginal preparation with Betadine solution and an in and out Foley catheterization was performed by nursing personnel. A time out was performed by the surgical team. The patient was draped in the usual fashion. The cervix was visualized with a speculum, anterior lip grasped with a single-tooth tenaculum and a paracervical block using 1% lidocaine was placed a total of 10 cc. The cervix was gently and gradually dilated to admit the operative hysteroscope and hysteroscopy was performed with findings noted above. Using the right angle resectoscopic loop the endometrial polyp was progressively resected in multiple passes to the level of the surrounding endometrium. All of these fragments were sent to pathology. The left lower uterine  segment/upper cervical canal submucous myoma was then resected in several passes and the specimen was also sent to pathology. A sharp curettage was then performed and the specimen was sent to pathology. Rehysteroscopy showed an empty cavity, good hemostasis, good distention and no evidence of perforation.  The instruments were removed and adequate hemostasis was visualized at the tenaculum site and external cervical os. The patient was placed in the supine position, awakened without difficulty and taken to the recovery room in good condition having tolerated the procedure well.   Dara Lords MD, 8:27 AM 08/03/2012

## 2012-08-03 NOTE — Transfer of Care (Signed)
Immediate Anesthesia Transfer of Care Note  Patient: Dana Chambers  Procedure(s) Performed: Procedure(s) (LRB) with comments: DILATATION AND CURETTAGE /HYSTEROSCOPY (N/A)  Patient Location: PACU  Anesthesia Type: General  Level of Consciousness: awake and oriented  Airway & Oxygen Therapy: Patient Spontanous Breathing and Patient connected to nasal cannula oxygen  Post-op Assessment: Report given to PACU RN  Post vital signs: Reviewed and stable  Complications: No apparent anesthesia complications

## 2012-08-03 NOTE — Anesthesia Preprocedure Evaluation (Addendum)
Anesthesia Evaluation  Patient identified by MRN, date of birth, ID band Patient awake    Reviewed: Allergy & Precautions, H&P , NPO status , Patient's Chart, lab work & pertinent test results  Airway Mallampati: I TM Distance: >3 FB Neck ROM: Full    Dental  (+) Teeth Intact and Dental Advisory Given   Pulmonary neg pulmonary ROS,  breath sounds clear to auscultation  Pulmonary exam normal       Cardiovascular Exercise Tolerance: Good - Past MI Rhythm:Regular Rate:Normal     Neuro/Psych negative psych ROS   GI/Hepatic Neg liver ROS, GERD-  Medicated,  Endo/Other    Renal/GU Renal disease     Musculoskeletal negative musculoskeletal ROS (+)   Abdominal (+) - obese,   Peds  Hematology negative hematology ROS (+)   Anesthesia Other Findings   Reproductive/Obstetrics                         Anesthesia Physical Anesthesia Plan  ASA: II  Anesthesia Plan: General   Post-op Pain Management:    Induction: Intravenous  Airway Management Planned: LMA  Additional Equipment:   Intra-op Plan:   Post-operative Plan:   Informed Consent: I have reviewed the patients History and Physical, chart, labs and discussed the procedure including the risks, benefits and alternatives for the proposed anesthesia with the patient or authorized representative who has indicated his/her understanding and acceptance.   Dental advisory given  Plan Discussed with: CRNA and Surgeon  Anesthesia Plan Comments:         Anesthesia Quick Evaluation

## 2012-08-06 ENCOUNTER — Encounter (HOSPITAL_BASED_OUTPATIENT_CLINIC_OR_DEPARTMENT_OTHER): Payer: Self-pay | Admitting: Gynecology

## 2012-08-07 ENCOUNTER — Telehealth: Payer: Self-pay | Admitting: Gynecology

## 2012-08-07 NOTE — Telephone Encounter (Signed)
Antibiotics okay. Water or any type of activity next week fine.

## 2012-08-07 NOTE — Telephone Encounter (Signed)
She can do whatever she wants to now.

## 2012-08-07 NOTE — Telephone Encounter (Signed)
Patient has been having problem with eye being matted upon waking up and some pain in it.  Saw her primary care today and she thinks related to sinus infection.  Prescribed Tobridex drops and Keflex.  Patient said she was "hoping this would not be a problem with her surgery".  Her surgery was this past Friday-D&C Hysteroscopy.  I reassured her that this should be fine and that it should not cause any post operative issues.  [facility] also [facility] not plan on going this week due to being post op and thinks she should not get in the water.  She said she wanted to know if you thought okay for next week.  I told her if she felt like it I thought you would probably be fine with her going back to water class now.  I told her I would check with you.

## 2012-08-07 NOTE — Telephone Encounter (Signed)
So does she have to wait until next week?  She did say she felt like going this week she just thought it was unsafe for her to be in the water after surgery.

## 2012-08-08 NOTE — Telephone Encounter (Signed)
Patient informed ok to resume normal activity at this point.  Patient said that this morning she saw some blood in the toilet and a little on tissue with wiping. No clot like you had mentioned might happen.  Patient wondered if this was to be expected.

## 2012-08-09 NOTE — Telephone Encounter (Signed)
Routed this back to you yesterday morning and have not heard back. Thanks.

## 2012-08-13 NOTE — Telephone Encounter (Signed)
Patient called and said she is doing great.  The spotting that she had mentioned to me on 08/08/12 continued through yesterday but has stopped today.  She thinks it is finished. I reassured her that her pathology was benign and most likely this spotting a direct result of the surgery.  Patient does not have any abd tenderness and feels fine.  She elected not to go back to her water exercise class until after her p.op visit next Monday with Dr. Velvet Bathe.  She will call me if she has any other questions.

## 2012-08-13 NOTE — Telephone Encounter (Signed)
Left message to call so I can follow-up with how patient is doing.

## 2012-08-15 ENCOUNTER — Ambulatory Visit
Admission: RE | Admit: 2012-08-15 | Discharge: 2012-08-15 | Disposition: A | Discharge status: Home / Self Care | Payer: Medicare Other | Source: Ambulatory Visit | Attending: Oncology | Admitting: Oncology

## 2012-08-15 DIAGNOSIS — Z853 Personal history of malignant neoplasm of breast: Secondary | ICD-10-CM

## 2012-08-20 ENCOUNTER — Ambulatory Visit (INDEPENDENT_AMBULATORY_CARE_PROVIDER_SITE_OTHER): Payer: Medicare Other | Admitting: Gynecology

## 2012-08-20 ENCOUNTER — Encounter: Payer: Self-pay | Admitting: Gynecology

## 2012-08-20 DIAGNOSIS — Z9889 Other specified postprocedural states: Secondary | ICD-10-CM

## 2012-08-20 DIAGNOSIS — N840 Polyp of corpus uteri: Secondary | ICD-10-CM

## 2012-08-20 DIAGNOSIS — D259 Leiomyoma of uterus, unspecified: Secondary | ICD-10-CM

## 2012-08-20 NOTE — Progress Notes (Signed)
Postoperative visit status post hysteroscopic endometrial polypectomy D&C. She has done well since surgery with occasional spotting.  Exam with kim assistant Abdomen soft nontender without masses guarding rebound organomegaly. Pelvic external BUS vagina with atrophic changes. Cervix normal. Uterus normal size midline mobile nontender. Adnexa without masses or tenderness.  Assessment and plan: Postoperative visit hysteroscopy D&C for endometrial polyp and submucous myomectomy. Patient does have multiple small myomas which we discussed. She's on she is doing well and we'll monitor bleeding. As long as no further bleeding minimal follow. If any recurrent bleeding she knows to represent for evaluation.

## 2012-08-20 NOTE — Patient Instructions (Signed)
Call if bleeding continues. Otherwise follow up routinely when due for annual exam.

## 2012-08-21 ENCOUNTER — Telehealth: Payer: Self-pay | Admitting: *Deleted

## 2012-08-21 NOTE — Telephone Encounter (Signed)
per md reschedule list moved patient to 09-17-2012 at 1:30pm left voice message to inform the patient of the new date and tim

## 2012-08-23 ENCOUNTER — Other Ambulatory Visit (HOSPITAL_BASED_OUTPATIENT_CLINIC_OR_DEPARTMENT_OTHER): Payer: Medicare Other | Admitting: Lab

## 2012-08-23 ENCOUNTER — Other Ambulatory Visit: Payer: Self-pay | Admitting: *Deleted

## 2012-08-23 ENCOUNTER — Other Ambulatory Visit: Payer: Medicare Other | Admitting: Lab

## 2012-08-23 ENCOUNTER — Other Ambulatory Visit: Payer: Self-pay | Admitting: Oncology

## 2012-08-23 DIAGNOSIS — C50119 Malignant neoplasm of central portion of unspecified female breast: Secondary | ICD-10-CM

## 2012-08-23 DIAGNOSIS — D0590 Unspecified type of carcinoma in situ of unspecified breast: Secondary | ICD-10-CM

## 2012-08-23 LAB — CBC WITH DIFFERENTIAL/PLATELET
BASO%: 0.7 % (ref 0.0–2.0)
Basophils Absolute: 0 10*3/uL (ref 0.0–0.1)
EOS%: 3.2 % (ref 0.0–7.0)
Eosinophils Absolute: 0.2 10*3/uL (ref 0.0–0.5)
HCT: 39.5 % (ref 34.8–46.6)
HGB: 13.4 g/dL (ref 11.6–15.9)
LYMPH%: 33.5 % (ref 14.0–49.7)
MCH: 30.6 pg (ref 25.1–34.0)
MCHC: 33.8 g/dL (ref 31.5–36.0)
MCV: 90.6 fL (ref 79.5–101.0)
MONO#: 0.6 10*3/uL (ref 0.1–0.9)
MONO%: 9.1 % (ref 0.0–14.0)
NEUT#: 3.7 10*3/uL (ref 1.5–6.5)
NEUT%: 53.5 % (ref 38.4–76.8)
Platelets: 276 10*3/uL (ref 145–400)
RBC: 4.37 10*6/uL (ref 3.70–5.45)
RDW: 13.2 % (ref 11.2–14.5)
WBC: 6.9 10*3/uL (ref 3.9–10.3)
lymph#: 2.3 10*3/uL (ref 0.9–3.3)

## 2012-08-23 LAB — COMPREHENSIVE METABOLIC PANEL (CC13)
ALT: 16 U/L (ref 0–55)
AST: 22 U/L (ref 5–34)
Albumin: 4 g/dL (ref 3.5–5.0)
Alkaline Phosphatase: 106 U/L (ref 40–150)
BUN: 19 mg/dL (ref 7.0–26.0)
CO2: 27 mEq/L (ref 22–29)
Calcium: 9.8 mg/dL (ref 8.4–10.4)
Chloride: 104 mEq/L (ref 98–107)
Creatinine: 0.9 mg/dL (ref 0.6–1.1)
Glucose: 96 mg/dl (ref 70–99)
Potassium: 4.5 mEq/L (ref 3.5–5.1)
Sodium: 141 mEq/L (ref 136–145)
Total Bilirubin: 0.4 mg/dL (ref 0.20–1.20)
Total Protein: 6.7 g/dL (ref 6.4–8.3)

## 2012-08-25 ENCOUNTER — Encounter: Payer: Self-pay | Admitting: Family Medicine

## 2012-08-25 ENCOUNTER — Ambulatory Visit (INDEPENDENT_AMBULATORY_CARE_PROVIDER_SITE_OTHER): Payer: Medicare Other | Admitting: Family Medicine

## 2012-08-25 ENCOUNTER — Ambulatory Visit (HOSPITAL_COMMUNITY)
Admission: RE | Admit: 2012-08-25 | Discharge: 2012-08-25 | Disposition: A | Discharge status: Home / Self Care | Payer: Medicare Other | Source: Ambulatory Visit | Attending: Family Medicine | Admitting: Family Medicine

## 2012-08-25 VITALS — BP 120/76 | HR 72 | Temp 97.7°F | Wt 142.0 lb

## 2012-08-25 DIAGNOSIS — S60031A Contusion of right middle finger without damage to nail, initial encounter: Secondary | ICD-10-CM

## 2012-08-25 DIAGNOSIS — M7989 Other specified soft tissue disorders: Secondary | ICD-10-CM | POA: Insufficient documentation

## 2012-08-25 DIAGNOSIS — R209 Unspecified disturbances of skin sensation: Secondary | ICD-10-CM | POA: Insufficient documentation

## 2012-08-25 DIAGNOSIS — S6000XA Contusion of unspecified finger without damage to nail, initial encounter: Secondary | ICD-10-CM

## 2012-08-25 NOTE — Progress Notes (Signed)
Subjective:    Patient ID: Dana Chambers, female    DOB: [birth year], 66 y.o.   MRN: [id number]  HPI Here with discoloration of R middle finger Hx of raynaud's dz  Feels sore  Was washing dishes last night , may have hurt it doing that - may have knocked it against a ring on her other finger -nothing major, however  Can bend it - nl range of motion   Fingers feel cold on and off  Esp if she is chilled  This is normal for her - and no different from usual today The affected finger does not feel especially cold or different from the other fingers   No hx of any PVD known  Patient Active Problem List  Diagnosis  . COLONIC POLYPS  . CARCINOMA IN SITU OF BREAST  . HEMORRHOIDS, INTERNAL  . GERD  . IRRITABLE BOWEL SYNDROME  . UTI  . BACK PAIN, LUMBAR  . OSTEOPENIA  . Vaginitis, atrophic  . Peripheral neuropathy, idiopathic  . Contusion of right middle finger without damage to nail   Past Medical History  Diagnosis Date  . GERD (gastroesophageal reflux disease)   . IBS (irritable bowel syndrome)   . Hyperplastic colon polyp   . Osteopenia   . Liver cyst BENIGN-- FOLLOWED BY DR RUBIN  . Rosacea   . Tinnitus   . History of breast cancer 2006  S/P RIGHT LUMPECTOMY AND RADIATION    FOLLOWED BY DR RUBIN--  NO RECURRENCE  . Internal hemorrhoid   . OA (osteoarthritis)   . History of kidney stones   . Renal calculus, left NON-OBSTRUCTIVE  . Idiopathic peripheral neuropathy BILATERAL TINGLING BOTTOM OF FEET  . Leiomyoma     Multiple small   Past Surgical History  Procedure Date  . Supranumerary nipple excision 1973    CYST  . Extracorporeal shock wave lithotripsy     X2  . Tonsillectomy AGE 4  . Breast lumpectomy 2006     Right -- S/P RADIATION--  NO RECURRENCE  . Hysteroscopy w/d&c 08/03/2012    Procedure: DILATATION AND CURETTAGE /HYSTEROSCOPY;  Surgeon: Dara Lords, MD;  Location: [facility];  Service: Gynecology;  Laterality: N/A;  . Dilation  and curettage of uterus   . Hysteroscopy    History  Substance Use Topics  . Smoking status: Never Smoker   . Smokeless tobacco: Never Used  . Alcohol Use: 5.2 oz/week    7 Glasses of wine, 2 Drinks containing 0.5 oz of alcohol per week     1-2 drinks daily    Family History  Problem Relation Age of Onset  . Dementia Mother   . Depression Mother   . Osteoporosis Mother   . Stroke Father   . Heart attack Father 48  . Colon cancer Neg Hx   . Cancer Maternal Grandmother     Rectal cancer   Allergies  Allergen Reactions  . Amoxicillin Diarrhea    GI upset   Current Outpatient Prescriptions on File Prior to Visit  Medication Sig Dispense Refill  . aspirin 81 MG tablet Take 81 mg by mouth daily.       . calcium citrate-vitamin D (CITRACAL+D) 315-200 MG-UNIT per tablet Take 1 tablet by mouth daily.       Marland Kitchen dicyclomine (BENTYL) 10 MG capsule Take 10 mg by mouth as needed.      Marland Kitchen ibuprofen (ADVIL,MOTRIN) 600 MG tablet Take 600 mg by mouth as needed.       Marland Kitchen  Multiple Vitamin (MULTIVITAMIN) tablet Take 1 tablet by mouth daily.       Marland Kitchen omeprazole (PRILOSEC) 40 MG capsule Take 40 mg by mouth every morning.      Marland Kitchen dextromethorphan-guaiFENesin (MUCINEX DM) 30-600 MG per 12 hr tablet Take 1 tablet by mouth as needed.       . fluticasone (FLONASE) 50 MCG/ACT nasal spray as needed. 1 spray up each nostril at bedtime      . sucralfate (CARAFATE) 1 G tablet as needed. Take one po BID           Review of Systems Review of Systems  Constitutional: Negative for fever, appetite change, fatigue and unexpected weight change.  Eyes: Negative for pain and visual disturbance.  Respiratory: Negative for cough and shortness of breath.   Cardiovascular: Negative for cp or palpitations    Gastrointestinal: Negative for nausea, diarrhea and constipation.  Genitourinary: Negative for urgency and frequency.  Skin: Negative for pallor or rash   Neurological: Negative for weakness, light-headedness,  numbness and headaches.  Hematological: Negative for adenopathy. Does not bruise/bleed easily.  Psychiatric/Behavioral: Negative for dysphoric mood. The patient is not nervous/anxious.         Objective:   Physical Exam  Constitutional: She appears well-developed and well-nourished. No distress.  HENT:  Head: Normocephalic.  Mouth/Throat: Oropharynx is clear and moist.  Eyes: Conjunctivae normal and EOM are normal. Pupils are equal, round, and reactive to light. No scleral icterus.  Neck: Normal range of motion. Neck supple. No JVD present. No thyromegaly present.  Cardiovascular: Normal rate, regular rhythm, normal heart sounds and intact distal pulses.  Exam reveals no gallop.   No murmur heard. Pulmonary/Chest: Effort normal and breath sounds normal. No respiratory distress. She has no wheezes.  Musculoskeletal: She exhibits edema and tenderness.       L middle finger - blue to black discoloration that is patchy and resembling ecchymosis, mild swelling and tenderness over middle phalynx and pip joint  Some pain to fully flex Nl sens/ rom/ strength, no crepitus Nl temp (same temp as other fingers) Nail looks nl with no splinter hemorrhages noted    Lymphadenopathy:    She has no cervical adenopathy.  Neurological: She is alert. She has normal strength and normal reflexes. She displays no atrophy. No cranial nerve deficit or sensory deficit. She exhibits normal muscle tone.  Skin: Skin is warm and dry. No rash noted. No erythema. No pallor.  Psychiatric: She has a normal mood and affect.          Assessment & Plan:

## 2012-08-25 NOTE — Patient Instructions (Addendum)
Please go across the street to [facility] and tell them you need an outpatient finger x ray - they will direct you to radiology  The order is in epic already  Use a warm compress for soreness- minimize lifting or heavy hand work  Call at any time for worse pain/ swelling/ discoloration or any numbness or change in temperature of the finger

## 2012-08-25 NOTE — Assessment & Plan Note (Addendum)
With bruising/ swelling/ mild soreness and mild trauma last night No splinter hemorrhages  No temperature changes at all -does have hx of raynauds , and color resembles ecchymosis moreso than cyanosis  Sent over to [facility] for xray- called report with no fractures  Adv pt to relatively rest finger- buddy tape to 4th finger for support if needed, tylenol prn pain and f/u with pcp if no imp

## 2012-08-30 ENCOUNTER — Ambulatory Visit: Payer: Medicare Other | Admitting: Oncology

## 2012-09-10 ENCOUNTER — Telehealth: Payer: Self-pay | Admitting: *Deleted

## 2012-09-10 NOTE — Telephone Encounter (Signed)
Patient confirmed over the phone the new date and time on 10-04-2012

## 2012-09-17 ENCOUNTER — Ambulatory Visit: Payer: Medicare Other | Admitting: Oncology

## 2012-10-04 ENCOUNTER — Other Ambulatory Visit: Payer: Medicare Other | Admitting: Lab

## 2012-10-04 ENCOUNTER — Ambulatory Visit (HOSPITAL_BASED_OUTPATIENT_CLINIC_OR_DEPARTMENT_OTHER): Payer: Medicare Other | Admitting: Oncology

## 2012-10-04 VITALS — BP 126/78 | HR 80 | Temp 97.0°F | Resp 20 | Ht 67.0 in | Wt 140.7 lb

## 2012-10-04 DIAGNOSIS — D0590 Unspecified type of carcinoma in situ of unspecified breast: Secondary | ICD-10-CM

## 2012-10-04 DIAGNOSIS — D0510 Intraductal carcinoma in situ of unspecified breast: Secondary | ICD-10-CM

## 2012-10-04 DIAGNOSIS — M899 Disorder of bone, unspecified: Secondary | ICD-10-CM

## 2012-10-04 DIAGNOSIS — Z853 Personal history of malignant neoplasm of breast: Secondary | ICD-10-CM

## 2012-10-04 NOTE — Patient Instructions (Addendum)
pls take 2000 units of vitamin D3

## 2012-10-04 NOTE — Progress Notes (Signed)
Hematology and Oncology Follow Up Visit  Dana Chambers [id number] 1946-08-12 66 y.o. 10/04/2012 2:29 PM   DIAGNOSIS:   DCIS s/p lumpectomy 2006, s/p xrt and tamoxifen x 4 yrs.  PAST THERAPY:  Hx uterine polyp removed 07/11/12 Most recent mammogram 9/13 -[facility]  Interim History:  doing well, c/o arthritic joints. Has a hx of osteopenia.  Medications: I have reviewed the patient's current medications.  Allergies:  Allergies  Allergen Reactions  . Amoxicillin Diarrhea    GI upset    Past Medical History, Surgical history, Social history, and Family History were reviewed and updated.  Review of Systems: Constitutional:  Negative for fever, chills, night sweats, anorexia, weight loss, pain. Cardiovascular: negative Respiratory: negative Neurological: negative Dermatological: negative ENT: negative Skin Gastrointestinal: negative Genito-Urinary: negative Hematological and Lymphatic: negative Breast: negative Musculoskeletal: negative Remaining ROS negative.  Physical Exam:  Blood pressure 126/78, pulse 80, temperature 97 F (36.1 C), resp. rate 20, height 5\' 7"  (1.702 m), weight 140 lb 11.2 oz (63.821 kg).  ECOG: 0    HEENT:  Sclerae anicteric, conjunctivae pink.  Oropharynx clear.  No mucositis or candidiasis.  Nodes:  No cervical, supraclavicular, or axillary lymphadenopathy palpated.  Breast Exam:  Right breast is benign.  No masses, discharge, skin change, or nipple inversion.  Left breast is benign.  No masses, discharge, skin change, or nipple inversion..  Lungs:  Clear to auscultation bilaterally.  No crackles, rhonchi, or wheezes.  Heart:  Regular rate and rhythm.  Abdomen:  Soft, nontender.  Positive bowel sounds.  No organomegaly or masses palpated.  Musculoskeletal:  No focal spinal tenderness to palpation.  Extremities:  Benign.  No peripheral edema or cyanosis.  Skin:  Benign.  Neuro:  Nonfocal.    Lab Results: Lab Results  Component Value Date   WBC 6.9  08/23/2012   HGB 13.4 08/23/2012   HCT 39.5 08/23/2012   MCV 90.6 08/23/2012   PLT 276 08/23/2012     Chemistry      Component Value Date/Time   NA 141 08/23/2012 1340   NA 138 08/01/2012 1440   K 4.5 08/23/2012 1340   K 4.5 08/01/2012 1440   CL 104 08/23/2012 1340   CL 100 08/01/2012 1440   CO2 27 08/23/2012 1340   CO2 29 08/01/2012 1440   BUN 19.0 08/23/2012 1340   BUN 18 08/01/2012 1440   CREATININE 0.9 08/23/2012 1340   CREATININE 0.83 08/01/2012 1440      Component Value Date/Time   CALCIUM 9.8 08/23/2012 1340   CALCIUM 10.0 08/01/2012 1440   ALKPHOS 106 08/23/2012 1340   ALKPHOS 138* 08/01/2012 1440   AST 22 08/23/2012 1340   AST 30 08/01/2012 1440   ALT 16 08/23/2012 1340   ALT 23 08/01/2012 1440   BILITOT 0.40 08/23/2012 1340   BILITOT 0.2* 08/01/2012 1440       Radiological Studies:  No results found.   IMPRESSIONS AND PLAN: A 66 y.o. 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 She is 7 years out from diagnosis. She is doing well with excellent overall health. I have not recommended that she followup at her office visit but with her primary care Dr. in an annual basis. She needs regular health care maintenance as well as a mammogram every year. I've encouraged her to continue breast self-examination as well. I am to seeing her again in the future should arise.  Spent more than  half the time coordinating care, as well as discussion of BMI and its implications.      Dana Chambers 11/7/20132:29 PM Cell [phone]

## 2012-10-04 NOTE — Progress Notes (Signed)
Hematology and Oncology Follow Up Visit  Dana Chambers [id number] 29-Mar-1946 66 y.o. 10/04/2012 2:44 PM   DIAGNOSIS:   DCIS s/p lumpectomy 2006, s/p xrt and tamoxifen x 4 yrs.  PAST THERAPY:  Hx uterine polyp removed 07/11/12 Most recent mammogram 9/13 -[facility]  Interim History:  doing well, c/o arthritic joints. Has a hx of osteopenia.  Medications: I have reviewed the patient's current medications.  Allergies:  Allergies  Allergen Reactions  . Amoxicillin Diarrhea    GI upset    Past Medical History, Surgical history, Social history, and Family History were reviewed and updated.  Review of Systems: Constitutional:  Negative for fever, chills, night sweats, anorexia, weight loss, pain. Cardiovascular: negative Respiratory: negative Neurological: negative Dermatological: negative ENT: negative Skin Gastrointestinal: negative Genito-Urinary: negative Hematological and Lymphatic: negative Breast: negative Musculoskeletal: negative Remaining ROS negative.  Physical Exam:  Blood pressure 126/78, pulse 80, temperature 97 F (36.1 C), resp. rate 20, height 5\' 7"  (1.702 m), weight 140 lb 11.2 oz (63.821 kg).  ECOG: 0    HEENT:  Sclerae anicteric, conjunctivae pink.  Oropharynx clear.  No mucositis or candidiasis.  Nodes:  No cervical, supraclavicular, or axillary lymphadenopathy palpated.  Breast Exam:  Right breast is benign.  No masses, discharge, skin change, or nipple inversion. The nipple area or complex is surgically absent.  Left breast is benign.  No masses, discharge, skin change, or nipple inversion..  Lungs:  Clear to auscultation bilaterally.  No crackles, rhonchi, or wheezes.  Heart:  Regular rate and rhythm.  Abdomen:  Soft, nontender.  Positive bowel sounds.  No organomegaly or masses palpated.  Musculoskeletal:  No focal spinal tenderness to palpation.  Extremities:  Benign.  No peripheral edema or cyanosis.  Skin:  Benign.  Neuro:  Nonfocal.    Lab  Results: Lab Results  Component Value Date   WBC 6.9 08/23/2012   HGB 13.4 08/23/2012   HCT 39.5 08/23/2012   MCV 90.6 08/23/2012   PLT 276 08/23/2012     Chemistry      Component Value Date/Time   NA 141 08/23/2012 1340   NA 138 08/01/2012 1440   K 4.5 08/23/2012 1340   K 4.5 08/01/2012 1440   CL 104 08/23/2012 1340   CL 100 08/01/2012 1440   CO2 27 08/23/2012 1340   CO2 29 08/01/2012 1440   BUN 19.0 08/23/2012 1340   BUN 18 08/01/2012 1440   CREATININE 0.9 08/23/2012 1340   CREATININE 0.83 08/01/2012 1440      Component Value Date/Time   CALCIUM 9.8 08/23/2012 1340   CALCIUM 10.0 08/01/2012 1440   ALKPHOS 106 08/23/2012 1340   ALKPHOS 138* 08/01/2012 1440   AST 22 08/23/2012 1340   AST 30 08/01/2012 1440   ALT 16 08/23/2012 1340   ALT 23 08/01/2012 1440   BILITOT 0.40 08/23/2012 1340   BILITOT 0.2* 08/01/2012 1440       Radiological Studies:  No results found.   IMPRESSIONS AND PLAN: A 66 y.o. female with   History of ER positive DCIS status post lumpectomy now this 7 years out. She had a recent uterine polyp. She has some other fairly minor and health related issues but overall she's doing quite well. I have not recommended followup in our office but I would recommend that she see her in the care Dr. every year for health related maintenance tests including bone density test mammograms. I'm happy see her in point the future should the need arise.  Spent  more than half the time coordinating care, as well as discussion of BMI and its implications.      Duane Trias 11/7/20132:44 PM Cell [phone]

## 2013-01-12 ENCOUNTER — Other Ambulatory Visit: Payer: Self-pay

## 2013-04-11 ENCOUNTER — Other Ambulatory Visit: Payer: Self-pay | Admitting: Family Medicine

## 2013-07-03 ENCOUNTER — Encounter: Payer: Self-pay | Admitting: Gynecology

## 2013-07-03 ENCOUNTER — Ambulatory Visit (INDEPENDENT_AMBULATORY_CARE_PROVIDER_SITE_OTHER): Payer: Medicare Other | Admitting: Gynecology

## 2013-07-03 VITALS — BP 124/78 | Ht 67.0 in | Wt 139.0 lb

## 2013-07-03 DIAGNOSIS — N952 Postmenopausal atrophic vaginitis: Secondary | ICD-10-CM

## 2013-07-03 DIAGNOSIS — C50919 Malignant neoplasm of unspecified site of unspecified female breast: Secondary | ICD-10-CM

## 2013-07-03 DIAGNOSIS — M899 Disorder of bone, unspecified: Secondary | ICD-10-CM

## 2013-07-03 DIAGNOSIS — C50911 Malignant neoplasm of unspecified site of right female breast: Secondary | ICD-10-CM

## 2013-07-03 DIAGNOSIS — M8580 Other specified disorders of bone density and structure, unspecified site: Secondary | ICD-10-CM

## 2013-07-03 DIAGNOSIS — IMO0002 Reserved for concepts with insufficient information to code with codable children: Secondary | ICD-10-CM

## 2013-07-03 NOTE — Patient Instructions (Signed)
Try over-the-counter vaginal moisturizers such as Replens as well as lubricants for intercourse. Followup for bone density. Followup in one year for annual exam.

## 2013-07-03 NOTE — Progress Notes (Signed)
Dana Chambers [birth year] [id number]        67 y.o.  G1P1001 for followup exam.  Several issues noted below.  Past medical history,surgical history, medications, allergies, family history and social history were all reviewed and documented in the EPIC chart.  ROS:  Performed and pertinent positives and negatives are included in the history, assessment and plan .  Exam: Kim assistant Filed Vitals:   07/03/13 0905  BP: 124/78  Height: 5\' 7"  (1.702 m)  Weight: 139 lb (63.05 kg)   General appearance  Normal Skin grossly normal Head/Neck normal with no cervical or supraclavicular adenopathy thyroid normal Lungs  clear Cardiac RR, without RMG Abdominal  soft, nontender, without masses, organomegaly or hernia Breasts  examined lying and sitting. Left without masses, retractions, discharge or axillary adenopathy. Right status post reconstruction without masses retractions or adenopathy Pelvic  Ext/BUS/vagina  normal with atrophic changes  Cervix  normal with atrophic changes  Uterus  axial to anteverted, normal size, shape and contour, midline and mobile nontender   Adnexa  Without masses or tenderness    Anus and perineum  normal   Rectovaginal  normal sphincter tone without palpated masses or tenderness.    Assessment/Plan:  67 y.o. G69P1001 female for followup exam.   1. Atrophic genital changes/dyspareunia. Patient is having discomfort with intercourse. Is not having vaginal dryness or irritation in between. Also is not having significant hot flashes or sweats. Discussed options to include OTC lubricants as well as moisturizers. Possibility for topical estrogen to include Vagifem or estrogen cream as well as Osphena discussed. The use in a breast cancer survivor and the issues of stimulating metastatic disease reviewed. The patient wants to try OTC lubricants and moisturizers and we'll go from there.OPayment 2. Osteopenia.  DEXA from 2012 showed T score -2.2. There was a statistically  significant decline at  that  time but  did not give the T score numbers. She had been on a bisphosphonate overall for about 10 years and now is on a drug-free holiday. Recommend repeat DEXA now and then discussion about treatment options if needed. Patient agrees to arrange. Increase calcium and vitamin D discussed. 3.  History of breast cancer 2006. Doing well. Up-to-date with mammograms and will continue with annual screening. SBE monthly reviewed. 4. Pap smear 2013. No Pap smear done today. No history of significant abnormal Pap smears. Options to stop screening altogether or less frequent screening intervals reviewed and we'll readdress on an annual basis. 5. Colonoscopy 2013. Repeated their recommended interval. 6. Health maintenance. No lab work done today as it is all done through Dr. Nelida Meuse office. Followup for DEXA otherwise one year, sooner as needed.  Note: This document was prepared with digital dictation and possible smart phrase technology. Any transcriptional errors that result from this process are unintentional.   Dara Lords MD, 9:55 AM 07/03/2013

## 2013-07-09 ENCOUNTER — Other Ambulatory Visit: Payer: Self-pay

## 2013-07-09 DIAGNOSIS — Z1231 Encounter for screening mammogram for malignant neoplasm of breast: Secondary | ICD-10-CM

## 2013-07-15 ENCOUNTER — Other Ambulatory Visit: Payer: Self-pay | Admitting: Family Medicine

## 2013-07-18 ENCOUNTER — Encounter: Payer: Medicare Other | Admitting: Family Medicine

## 2013-08-15 ENCOUNTER — Telehealth: Payer: Self-pay | Admitting: Internal Medicine

## 2013-08-15 NOTE — Telephone Encounter (Signed)
Pt states she has been having problems with GERD. States she is having a lot of burping and some pain under her ribs on the left side. Pt is going out of town next week and wants to make sure she does not have problems while she is gone. Pt scheduled to see Dr. Juanda Chance tomorrow at 2:15pm. Pt aware of appt.

## 2013-08-16 ENCOUNTER — Encounter: Payer: Self-pay | Admitting: *Deleted

## 2013-08-16 ENCOUNTER — Ambulatory Visit (INDEPENDENT_AMBULATORY_CARE_PROVIDER_SITE_OTHER): Payer: Medicare Other | Admitting: Internal Medicine

## 2013-08-16 ENCOUNTER — Ambulatory Visit
Admission: RE | Admit: 2013-08-16 | Discharge: 2013-08-16 | Disposition: A | Discharge status: Home / Self Care | Payer: Medicare Other | Source: Ambulatory Visit

## 2013-08-16 VITALS — BP 112/78 | HR 88 | Ht 66.5 in | Wt 142.0 lb

## 2013-08-16 DIAGNOSIS — Z1231 Encounter for screening mammogram for malignant neoplasm of breast: Secondary | ICD-10-CM

## 2013-08-16 DIAGNOSIS — R1012 Left upper quadrant pain: Secondary | ICD-10-CM

## 2013-08-16 MED ORDER — SUCRALFATE 1 GM/10ML PO SUSP: 1.0000 g | Freq: Two times a day (BID) | ORAL | Status: DC

## 2013-08-16 MED ORDER — OMEPRAZOLE 40 MG PO CPDR: 40.0000 mg | DELAYED_RELEASE_CAPSULE | Freq: Two times a day (BID) | ORAL | Status: DC

## 2013-08-16 NOTE — Progress Notes (Signed)
Dana Chambers [birth year] MRN [id number]  History of Present Illness:  This is a 67 year old white female with a history of gastroesophageal reflux who has had an exacerbation of her symptoms for the past 2 months. She attributes it to two 10 day courses of antibiotics for an eye infection consisting of Keflex 500 mg twice a day and doxycycline 100 mg daily. She has developed constipation and left upper quadrant abdominal discomfort as well as dyspepsia. She has been on Prilosec 40 mg daily but felt that it was not working. Her last upper endoscopy and colonoscopy in January 2013 showed chronic gastritis which was H. pylori negative and a hyperplastic polyp. There is a family history of colon cancer in grandparents. Her prior endoscopies were in 1995 and 2006. She takes Ibuprofen 600 mg when necessary.   Past Medical History  Diagnosis Date  . GERD (gastroesophageal reflux disease)   . IBS (irritable bowel syndrome)   . Hyperplastic colon polyp   . Osteopenia 2012    T score -2.2 No FRAX due to prior bisphosphate treatment  . Liver cyst     Benign  . Rosacea   . Tinnitus   . History of breast cancer     2006  S/P RIGHT LUMPECTOMY AND RADIATION FOLLOWED BY DR RUBIN--  NO RECURRENCE  . Internal hemorrhoid   . OA (osteoarthritis)   . History of kidney stones   . Renal calculus, left     non obstructive  . Idiopathic peripheral neuropathy     bilateral tingling  . Leiomyoma     Multiple small  . Diverticulosis    Past Surgical History  Procedure Laterality Date  . Supranumerary nipple excision  1973    CYST  . Extracorporeal shock wave lithotripsy      X2  . Tonsillectomy  AGE 67  . Breast lumpectomy  2006     Right -- S/P RADIATION--  NO RECURRENCE  . Hysteroscopy w/d&c  08/03/2012    Procedure: DILATATION AND CURETTAGE /HYSTEROSCOPY;  Surgeon: Dana Lords, MD;  Location: [facility];  Service: Gynecology;  Laterality: N/A;  . Dilation and curettage of  uterus    . Hysteroscopy      reports that she has never smoked. She has never used smokeless tobacco. She reports that  drinks alcohol. She reports that she does not use illicit drugs. family history includes Dementia in her mother; Depression in her mother; Heart attack (age of onset: 20) in her father; Osteoporosis in her mother; Rectal cancer in her maternal grandmother; Stroke in her father and sister. There is no history of Colon cancer. Allergies  Allergen Reactions  . Amoxicillin Diarrhea    GI upset        Review of Systems: Negative for dysphagia fever vomiting  The remainder of the 10 point ROS is negative except as outlined in H&P   Physical Exam: General appearance  Well developed, in no distress. Eyes- non icteric. HEENT nontraumatic, normocephalic. Mouth no lesions, tongue papillated, no cheilosis. Neck supple without adenopathy, thyroid not enlarged, no carotid bruits, no JVD. Lungs Clear to auscultation bilaterally. Cor normal S1, normal S2, regular rhythm, no murmur,  quiet precordium. Abdomen: Tender in epigastrium and along left costal margin. No fullness or bruit. No CVA tenderness. Rectal: Not done. Extremities no pedal edema. Skin no lesions. Neurological alert and oriented x 3. Psychological normal mood and affect.  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 We will increase her Prilosec to 40 mg twice a day and add Carafate slurry 10 cc by mouth twice a day. I have given her samples of a probiotic to take daily to restore her bacterial flora. She will call us in 10 days before she goes out-of-town to give [place] an update.   08/16/2013 Dana Chambers

## 2013-08-16 NOTE — Patient Instructions (Addendum)
We have sent the following medications to your pharmacy for you to pick up at your convenience: Prilosec 40 mg twice daily Carafate 10 cc (2 teaspoons) twice daily  We have given you samples of Florastor. You may purchase more of this over the counter to take on a daily basis.  Please call our office in 10 days and speak to [place] to let her know how you are doing. Our phone number is [phone].  CC: Dr Tawanna Cooler

## 2013-08-21 ENCOUNTER — Ambulatory Visit (INDEPENDENT_AMBULATORY_CARE_PROVIDER_SITE_OTHER): Payer: Medicare Other

## 2013-08-21 DIAGNOSIS — Z23 Encounter for immunization: Secondary | ICD-10-CM

## 2013-08-26 ENCOUNTER — Telehealth: Payer: Self-pay | Admitting: Internal Medicine

## 2013-08-26 NOTE — Telephone Encounter (Signed)
Please try Dexilant 60 mg po qd, #30 in place of Prilosec 40 mg po bid.

## 2013-08-26 NOTE — Telephone Encounter (Signed)
Patient calling with update. She is taking Prilosec BID, Carafate BID and probiotics. She is still waking up at night with stomach growling, gas and discomfort. She describes the discomfort at tenderness. No burning or reflux. She is leaving to go out of town tomorrow. She will be back home on Saturday. She will be available on Monday and Tuesday next week. Please, advise.

## 2013-08-27 MED ORDER — DEXLANSOPRAZOLE 60 MG PO CPDR: 60.0000 mg | DELAYED_RELEASE_CAPSULE | Freq: Every day | ORAL | Status: DC

## 2013-08-27 NOTE — Telephone Encounter (Signed)
Patient given Dr. Regino Schultze recommendation and rx sent to pharmacy.

## 2013-08-27 NOTE — Telephone Encounter (Signed)
Left a message for patient to call me. 

## 2013-09-02 ENCOUNTER — Ambulatory Visit (INDEPENDENT_AMBULATORY_CARE_PROVIDER_SITE_OTHER): Payer: Medicare Other | Admitting: Family Medicine

## 2013-09-02 ENCOUNTER — Encounter: Payer: Self-pay | Admitting: Family Medicine

## 2013-09-02 VITALS — BP 120/80 | Temp 98.1°F | Ht 66.5 in | Wt 142.0 lb

## 2013-09-02 DIAGNOSIS — Z0000 Encounter for general adult medical examination without abnormal findings: Secondary | ICD-10-CM

## 2013-09-02 DIAGNOSIS — M899 Disorder of bone, unspecified: Secondary | ICD-10-CM

## 2013-09-02 DIAGNOSIS — G609 Hereditary and idiopathic neuropathy, unspecified: Secondary | ICD-10-CM

## 2013-09-02 DIAGNOSIS — D0591 Unspecified type of carcinoma in situ of right breast: Secondary | ICD-10-CM

## 2013-09-02 DIAGNOSIS — K219 Gastro-esophageal reflux disease without esophagitis: Secondary | ICD-10-CM

## 2013-09-02 DIAGNOSIS — D0590 Unspecified type of carcinoma in situ of unspecified breast: Secondary | ICD-10-CM

## 2013-09-02 DIAGNOSIS — K589 Irritable bowel syndrome without diarrhea: Secondary | ICD-10-CM

## 2013-09-02 LAB — CBC WITH DIFFERENTIAL/PLATELET
Basophils Absolute: 0 10*3/uL (ref 0.0–0.1)
Basophils Absolute: 0 10*3/uL (ref 0.0–0.1)
Basophils Relative: 0.5 % (ref 0.0–3.0)
Basophils Relative: 0.5 % (ref 0.0–3.0)
Eosinophils Absolute: 0.1 10*3/uL (ref 0.0–0.7)
Eosinophils Absolute: 0.1 10*3/uL (ref 0.0–0.7)
Eosinophils Relative: 1.3 % (ref 0.0–5.0)
Eosinophils Relative: 2.2 % (ref 0.0–5.0)
HCT: 41.2 % (ref 36.0–46.0)
HCT: 41.3 % (ref 36.0–46.0)
Hemoglobin: 14 g/dL (ref 12.0–15.0)
Hemoglobin: 13.9 g/dL (ref 12.0–15.0)
Lymphocytes Relative: 36.7 % (ref 12.0–46.0)
Lymphocytes Relative: 37.9 % (ref 12.0–46.0)
Lymphs Abs: 1.8 10*3/uL (ref 0.7–4.0)
Lymphs Abs: 1.8 10*3/uL (ref 0.7–4.0)
MCHC: 33.8 g/dL (ref 30.0–36.0)
MCHC: 33.5 g/dL (ref 30.0–36.0)
MCV: 89.1 fl (ref 78.0–100.0)
MCV: 89.7 fl (ref 78.0–100.0)
Monocytes Absolute: 0.6 10*3/uL (ref 0.1–1.0)
Monocytes Absolute: 0.6 10*3/uL (ref 0.1–1.0)
Monocytes Relative: 11.8 % (ref 3.0–12.0)
Monocytes Relative: 12.2 % — ABNORMAL HIGH (ref 3.0–12.0)
Neutro Abs: 2.4 10*3/uL (ref 1.4–7.7)
Neutro Abs: 2.3 10*3/uL (ref 1.4–7.7)
Neutrophils Relative %: 49.7 % (ref 43.0–77.0)
Neutrophils Relative %: 47.2 % (ref 43.0–77.0)
Platelets: 291 10*3/uL (ref 150.0–400.0)
Platelets: 284 10*3/uL (ref 150.0–400.0)
RBC: 4.63 Mil/uL (ref 3.87–5.11)
RBC: 4.61 Mil/uL (ref 3.87–5.11)
RDW: 13.8 % (ref 11.5–14.6)
RDW: 13.5 % (ref 11.5–14.6)
WBC: 4.8 10*3/uL (ref 4.5–10.5)
WBC: 4.8 10*3/uL (ref 4.5–10.5)

## 2013-09-02 LAB — POCT URINALYSIS DIPSTICK
Bilirubin, UA: NEGATIVE
Blood, UA: NEGATIVE
Glucose, UA: NEGATIVE
Ketones, UA: NEGATIVE
Leukocytes, UA: NEGATIVE
Nitrite, UA: NEGATIVE
Protein, UA: NEGATIVE
Spec Grav, UA: 1.02
Urobilinogen, UA: 0.2
pH, UA: 7

## 2013-09-02 LAB — BASIC METABOLIC PANEL
BUN: 17 mg/dL (ref 6–23)
CO2: 28 mEq/L (ref 19–32)
Calcium: 9 mg/dL (ref 8.4–10.5)
Chloride: 102 mEq/L (ref 96–112)
Creatinine, Ser: 0.8 mg/dL (ref 0.4–1.2)
GFR: 74.52 mL/min (ref >60.00)
Glucose, Bld: 100 mg/dL — ABNORMAL HIGH (ref 70–99)
Potassium: 4.4 mEq/L (ref 3.5–5.1)
Sodium: 137 mEq/L (ref 135–145)

## 2013-09-02 LAB — HEPATIC FUNCTION PANEL
ALT: 22 U/L (ref 0–35)
AST: 27 U/L (ref 0–37)
Albumin: 4.4 g/dL (ref 3.5–5.2)
Alkaline Phosphatase: 88 U/L (ref 39–117)
Bilirubin, Direct: 0 mg/dL (ref 0.0–0.3)
Total Bilirubin: 0.6 mg/dL (ref 0.3–1.2)
Total Protein: 7.3 g/dL (ref 6.0–8.3)

## 2013-09-02 LAB — TSH: TSH: 1.01 u[IU]/mL (ref 0.35–5.50)

## 2013-09-02 NOTE — Patient Instructions (Addendum)
Continue good health habits exercise daily  Thorough BSE monthly  And you mammography  If you feel anything abnormal or year not sure you feel something abnormal return for further evaluation and not wait for a year  Return in one year for general medical evaluation  Lactose-free diet for 1 month to see if this does not help your GI symptoms

## 2013-09-02 NOTE — Progress Notes (Signed)
Subjective:    Patient ID: Dana Chambers, female    DOB: [birth year], 67 y.o.   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 She was getting diagnostic mammography however since she's been breast cancer free for 8 years she is down to regular annual mammography. She no longer requires surgical consults. Her oncologist Dr. Donnie Coffin told her she did not need to come back to the oncology Center any more for followup.  Colonoscopy 2012 normal except for a polyp removed from Dr. Dickie La. She has history of IBS is on medication from Dr. Dickie La. She's also complaining are gas belching and some left lower quadrant pain that comes and goes.  Cognitive function normal she exercises on a regular basis home health safety reviewed no issues identified, no guns in the house, she does have a health care power of attorney and living well. Vaccinations up-to-date tetanus booster 2013 seasonal flu shot today information Shingles   Review of Systems  Constitutional: Negative.   HENT: Negative.   Eyes: Negative.   Respiratory: Negative.   Cardiovascular: Negative.   Gastrointestinal: Negative.   Endocrine: Negative.   Genitourinary: Negative.   Musculoskeletal: Negative.   Allergic/Immunologic: Negative.   Neurological: Negative.   Hematological: Negative.   Psychiatric/Behavioral: Negative.        Objective:   Physical Exam  Constitutional: She appears well-developed and well-nourished.  HENT:  Head: Normocephalic and atraumatic.  Right Ear: External ear normal.  Left Ear: External ear  normal.  Nose: Nose normal.  Mouth/Throat: Oropharynx is clear and moist.  Eyes: EOM are normal. Pupils are equal, round, and reactive to light. Right eye exhibits no discharge. Left eye exhibits no discharge. No scleral icterus.  Neck: Normal range of motion. Neck supple. No JVD present. No thyromegaly present.  Cardiovascular: Normal rate, regular rhythm, normal heart sounds and intact distal pulses.  Exam reveals no gallop and no friction rub.   No murmur heard. Pulmonary/Chest: Effort normal and breath sounds normal. No stridor.  Abdominal: Soft. Bowel sounds are normal. She exhibits no distension and no mass. There is no tenderness. There is no rebound and no guarding.  Genitourinary: Vagina normal and uterus normal. Guaiac negative stool. No vaginal discharge found.  Pelvic and Pap done by GYN because she had a D&C last year for thickening of the lining of her uterus which it was all benign.  Bilateral breast exam shows left breast normal except for multiple fibrocystic changes most prominent 6:00. They're soft rubbery movable. Right breast shows a scar from previous surgery thickening around the incision normal no palpable masses.  Musculoskeletal: Normal range of motion.  Lymphadenopathy:    She has no cervical adenopathy.  Neurological: She is alert. She has normal reflexes. No cranial nerve deficit. She exhibits normal muscle tone. Coordination normal.  Skin: Skin is warm and dry. No rash noted. No erythema. No pallor.  Total body skin exam normal except for multiple freckles capillaries seborrheic keratosis and tattoos right chest wall from previous radiation for right breast cancer  Psychiatric: She has a normal mood  and affect. Her behavior is normal. Judgment and thought content normal.          Assessment & Plan:  Healthy female  IBS followed by Dr. Dickie La recommend lactose-free diet  History of right breast cancer 8 years ago recommend routine breast exam monthly at home  and you mammography followup with me yearly sooner if any problems  Recent D&C for thickening of the uterine wall followup by GYN

## 2013-09-03 LAB — BASIC METABOLIC PANEL
BUN: 18 mg/dL (ref 6–23)
CO2: 25 mEq/L (ref 19–32)
Calcium: 9.5 mg/dL (ref 8.4–10.5)
Chloride: 107 mEq/L (ref 96–112)
Creatinine, Ser: 0.8 mg/dL (ref 0.4–1.2)
GFR: 73.48 mL/min (ref >60.00)
Glucose, Bld: 101 mg/dL — ABNORMAL HIGH (ref 70–99)
Potassium: 4.9 mEq/L (ref 3.5–5.1)
Sodium: 146 mEq/L — ABNORMAL HIGH (ref 135–145)

## 2013-09-03 LAB — HEPATIC FUNCTION PANEL
ALT: 21 U/L (ref 0–35)
AST: 30 U/L (ref 0–37)
Albumin: 4.5 g/dL (ref 3.5–5.2)
Alkaline Phosphatase: 96 U/L (ref 39–117)
Bilirubin, Direct: 0 mg/dL (ref 0.0–0.3)
Total Bilirubin: 0.3 mg/dL (ref 0.3–1.2)
Total Protein: 7.5 g/dL (ref 6.0–8.3)

## 2013-09-03 LAB — TSH: TSH: 1.08 u[IU]/mL (ref 0.35–5.50)

## 2013-09-27 ENCOUNTER — Telehealth: Payer: Self-pay | Admitting: Internal Medicine

## 2013-09-27 DIAGNOSIS — K219 Gastro-esophageal reflux disease without esophagitis: Secondary | ICD-10-CM

## 2013-09-27 DIAGNOSIS — R1013 Epigastric pain: Secondary | ICD-10-CM

## 2013-09-27 NOTE — Telephone Encounter (Signed)
Patient is taking Dexilant daily. Still having the same symptoms. Pain is under her left rib and is pressure like. She wakes up around 2 AM with the pressure feeling. She is taking her Carafate off and on but not regular. She takes Align prn also. Please, advise.

## 2013-09-28 NOTE — Telephone Encounter (Signed)
I think she needs  Upper abd. Sono, amy, lipase. Ans send GI cocktail ([employer] has the  Recipe)  12 oz 1 tablespoon at bedtime  And before meals prn stomach pain

## 2013-09-30 ENCOUNTER — Other Ambulatory Visit (INDEPENDENT_AMBULATORY_CARE_PROVIDER_SITE_OTHER): Payer: Medicare Other

## 2013-09-30 DIAGNOSIS — R1013 Epigastric pain: Secondary | ICD-10-CM

## 2013-09-30 DIAGNOSIS — K219 Gastro-esophageal reflux disease without esophagitis: Secondary | ICD-10-CM

## 2013-09-30 LAB — AMYLASE: Amylase: 98 U/L (ref 27–131)

## 2013-09-30 LAB — LIPASE: Lipase: 55 U/L (ref 11.0–59.0)

## 2013-09-30 MED ORDER — GI COCKTAIL ~~LOC~~: ORAL | Status: DC

## 2013-09-30 NOTE — Telephone Encounter (Signed)
Scheduled [place] at [facility] radiology on 10/07/13 at 9:00 AM(Alisha). NPO after midnight.  Labs in EPIC. Rx sent. Left a message for patient to call me.

## 2013-09-30 NOTE — Telephone Encounter (Signed)
Patient given recommendations and appointment date and time.

## 2013-10-07 ENCOUNTER — Ambulatory Visit (HOSPITAL_COMMUNITY)
Admission: RE | Admit: 2013-10-07 | Discharge: 2013-10-07 | Disposition: A | Discharge status: Home / Self Care | Payer: Medicare Other | Source: Ambulatory Visit | Attending: Internal Medicine | Admitting: Internal Medicine

## 2013-10-07 DIAGNOSIS — R109 Unspecified abdominal pain: Secondary | ICD-10-CM | POA: Insufficient documentation

## 2013-10-07 DIAGNOSIS — R1013 Epigastric pain: Secondary | ICD-10-CM

## 2013-10-07 DIAGNOSIS — N200 Calculus of kidney: Secondary | ICD-10-CM | POA: Insufficient documentation

## 2013-10-07 DIAGNOSIS — K219 Gastro-esophageal reflux disease without esophagitis: Secondary | ICD-10-CM

## 2013-10-08 ENCOUNTER — Other Ambulatory Visit: Payer: Self-pay | Admitting: *Deleted

## 2013-10-08 MED ORDER — OMEPRAZOLE 40 MG PO CPDR: 40.0000 mg | DELAYED_RELEASE_CAPSULE | Freq: Two times a day (BID) | ORAL | Status: DC

## 2013-10-08 MED ORDER — SUCRALFATE 1 GM/10ML PO SUSP: 1.0000 g | Freq: Two times a day (BID) | ORAL | Status: DC

## 2013-10-10 ENCOUNTER — Telehealth: Payer: Self-pay | Admitting: Gynecology

## 2013-10-10 ENCOUNTER — Encounter: Payer: Self-pay | Admitting: Gynecology

## 2013-10-10 NOTE — Telephone Encounter (Signed)
Tell patient I received her bone density results ordered by Dr. Tawanna Cooler. It does show osteoporosis. Question is whether she should be treated or not. She needs to have this discussion with either Dr. Tawanna Cooler or myself.

## 2013-10-11 NOTE — Telephone Encounter (Signed)
Pt informed with the below note, pt will see TF, transferred to front desk.

## 2013-11-01 ENCOUNTER — Ambulatory Visit: Payer: Self-pay | Admitting: Gynecology

## 2013-11-05 ENCOUNTER — Ambulatory Visit (INDEPENDENT_AMBULATORY_CARE_PROVIDER_SITE_OTHER): Payer: Medicare Other | Admitting: Gynecology

## 2013-11-05 ENCOUNTER — Encounter: Payer: Self-pay | Admitting: Gynecology

## 2013-11-05 DIAGNOSIS — M810 Age-related osteoporosis without current pathological fracture: Secondary | ICD-10-CM

## 2013-11-05 NOTE — Progress Notes (Signed)
Patient presents to discuss her most recent DEXA with diagnosis of osteoporosis. History of bisphosphate used previously to include the knee there for approximately 10 years. Has been on a drug-free holiday for several years. Most recent DEXA shows 33% radial site left forearm -3.0 with a 14.9% decline from previous study. L1-L3 T score -2.0 6.2% decline from previous left hip neck -2.2 right hip neck -2.5 both stable from prior studies.  Reviewed situation with patient and my recommendation to consider treatment. Options include reinitiation of a bisphosphate such as Actonel monthly or considering a different medication such as Prolia. She has read about Reclast and is not interested in that. Do not feel Forto necessarily needed at this time nor as patient interested in a daily injection. With history of breast cancer HRT excluded. Calcitonin option less effective. Possibilities for Evista.  Risks to include GERD, osteonecrosis of the jaw, atypical fractures particularly with prolonged use, rashes and infections all reviewed.  Patient prefers to discuss with Dr. Tawanna Cooler her primary physician and will followup with her final decision as to which medication she chooses. Regardless would then recommend repeating her bone density a two-year interval. Would also recommend vitamin D level at her next blood draw.

## 2013-11-05 NOTE — Patient Instructions (Signed)
Follow up with your decision as far as the osteoporosis medication. 

## 2013-12-26 ENCOUNTER — Encounter: Payer: Self-pay | Admitting: Family Medicine

## 2013-12-30 ENCOUNTER — Encounter: Payer: Self-pay | Admitting: Internal Medicine

## 2014-01-03 ENCOUNTER — Telehealth: Payer: Self-pay | Admitting: *Deleted

## 2014-01-03 NOTE — Telephone Encounter (Signed)
Pt calling to follow up from [place] on 11/05/14 regarding her decision about treatment for bones. Pt said she would like to proceed with Prolia. Please advise

## 2014-01-03 NOTE — Telephone Encounter (Signed)
Okay to arrange for Prolia

## 2014-01-03 NOTE — Telephone Encounter (Signed)
Please see the below. 

## 2014-01-06 ENCOUNTER — Telehealth: Payer: Self-pay | Admitting: *Deleted

## 2014-01-06 NOTE — Telephone Encounter (Signed)
Prolia benefits requested. Will contact pt when receive that information. KW CMA

## 2014-01-13 NOTE — Telephone Encounter (Signed)
Pt informed of Prolia benefits $50 copay. Apt is 01/20/14 at 10am. Prolia ordered KW CMA

## 2014-01-15 NOTE — Telephone Encounter (Signed)
Benefits requested. KW CMA

## 2014-01-15 NOTE — Telephone Encounter (Signed)
Pt informed $50 copay and see telephone encounter 01/06/14 [place]

## 2014-01-20 ENCOUNTER — Ambulatory Visit (INDEPENDENT_AMBULATORY_CARE_PROVIDER_SITE_OTHER): Payer: Medicare Other | Admitting: Gynecology

## 2014-01-20 DIAGNOSIS — M810 Age-related osteoporosis without current pathological fracture: Secondary | ICD-10-CM

## 2014-01-20 MED ORDER — DENOSUMAB 60 MG/ML ~~LOC~~ SOLN
60.0000 mg | Freq: Once | SUBCUTANEOUS | Status: AC
  Administered 2014-01-20: 60 mg via SUBCUTANEOUS

## 2014-05-19 ENCOUNTER — Other Ambulatory Visit: Payer: Self-pay | Admitting: *Deleted

## 2014-05-19 MED ORDER — OMEPRAZOLE 40 MG PO CPDR: 40.0000 mg | DELAYED_RELEASE_CAPSULE | Freq: Two times a day (BID) | ORAL | Status: DC

## 2014-06-19 ENCOUNTER — Telehealth: Payer: Self-pay | Admitting: Internal Medicine

## 2014-06-19 DIAGNOSIS — K219 Gastro-esophageal reflux disease without esophagitis: Secondary | ICD-10-CM

## 2014-06-19 MED ORDER — RANITIDINE HCL 150 MG PO CAPS
ORAL_CAPSULE | ORAL | Status: DC
Start: 2014-06-19 — End: 2014-08-19

## 2014-06-19 MED ORDER — SUCRALFATE 1 GM/10ML PO SUSP: ORAL | Status: DC

## 2014-06-19 NOTE — Telephone Encounter (Signed)
Patient states she is having problems with reflux. For the last month, she is having pressure under ribs and left side. For several months, she is waking up at night and can hear "digestion going on." Currently taking Omeprazole 40 mg BID and has been taking Carafate 2 teaspoons BID since middle of June. She is going to the beach for a week on Sunday and would like something to help her feel better. Please, advise.

## 2014-06-19 NOTE — Telephone Encounter (Signed)
I have her as taking Prilosec 40 mg po bid, if she is not taking it, she should start on it. Also Please add Ranitidine 150 mg po in the middle of the day, #30, 3 refills. [place] refill Carafate slurru which she took last September during her flare up- disp 14 oz, 2 tsp po bid. 1 refill,

## 2014-06-19 NOTE — Telephone Encounter (Signed)
Patient given recommendations. Rx's sent.

## 2014-07-15 ENCOUNTER — Other Ambulatory Visit: Payer: Self-pay

## 2014-07-15 DIAGNOSIS — Z9889 Other specified postprocedural states: Secondary | ICD-10-CM

## 2014-07-15 DIAGNOSIS — Z853 Personal history of malignant neoplasm of breast: Secondary | ICD-10-CM

## 2014-07-15 DIAGNOSIS — Z1231 Encounter for screening mammogram for malignant neoplasm of breast: Secondary | ICD-10-CM

## 2014-07-21 ENCOUNTER — Telehealth: Payer: Self-pay | Admitting: Internal Medicine

## 2014-07-21 ENCOUNTER — Encounter: Payer: Medicare Other | Admitting: Gynecology

## 2014-07-21 DIAGNOSIS — R140 Abdominal distension (gaseous): Secondary | ICD-10-CM

## 2014-07-21 DIAGNOSIS — R1011 Right upper quadrant pain: Secondary | ICD-10-CM

## 2014-07-21 MED ORDER — ESOMEPRAZOLE MAGNESIUM 40 MG PO CPDR: 40.0000 mg | DELAYED_RELEASE_CAPSULE | Freq: Two times a day (BID) | ORAL | Status: DC

## 2014-07-21 NOTE — Telephone Encounter (Signed)
Rx sent to pharmacy. Scheduled [facility]ES at [facility] radiology on 07/25/14 at 7:30 AM. NPO after midnight and no stomach meds after midnight. Patient aware of appointment date, time and instructions.

## 2014-07-21 NOTE — Telephone Encounter (Signed)
Spoke with patient and she states she has been on the Zantac for 1 month in addition to the Carafate and Prilosec. She is still having problems with reflux that wakes her up at night. Right sided pain and bloating occurs.States she will be going out of town in October and would like to schedule an OV prior to that to discuss.  Scheduled OV on 08/19/14 at 8:30 AM.

## 2014-07-21 NOTE — Telephone Encounter (Signed)
Please switch from Prilosec to Nexiem 40 mg po bid and schedule GES in [facility] to evaluate for gastroparesis

## 2014-07-23 ENCOUNTER — Encounter: Payer: Self-pay | Admitting: *Deleted

## 2014-07-23 ENCOUNTER — Telehealth: Payer: Self-pay | Admitting: *Deleted

## 2014-07-23 NOTE — Telephone Encounter (Signed)
prolia benefits, covered as a copay. $50 for Prolia and $35 for OV. Pt will get injection at [place] 08/06/14. KW CMA I did leave a message for the patient. KW CMA

## 2014-07-25 ENCOUNTER — Encounter (HOSPITAL_COMMUNITY)
Admission: RE | Admit: 2014-07-25 | Discharge: 2014-07-25 | Disposition: A | Discharge status: Home / Self Care | Payer: Medicare Other | Source: Ambulatory Visit | Attending: Internal Medicine | Admitting: Internal Medicine

## 2014-07-25 DIAGNOSIS — R1011 Right upper quadrant pain: Secondary | ICD-10-CM | POA: Diagnosis present

## 2014-07-25 DIAGNOSIS — R141 Gas pain: Secondary | ICD-10-CM | POA: Diagnosis present

## 2014-07-25 DIAGNOSIS — R143 Flatulence: Principal | ICD-10-CM

## 2014-07-25 DIAGNOSIS — R140 Abdominal distension (gaseous): Secondary | ICD-10-CM

## 2014-07-25 DIAGNOSIS — R142 Eructation: Principal | ICD-10-CM

## 2014-07-25 MED ORDER — TECHNETIUM TC 99M SULFUR COLLOID
2.2000 | Freq: Once | INTRAVENOUS | Status: AC | PRN
  Administered 2014-07-25: 2.2 via ORAL

## 2014-07-29 ENCOUNTER — Other Ambulatory Visit: Payer: Self-pay | Admitting: *Deleted

## 2014-07-29 MED ORDER — METOCLOPRAMIDE HCL 10 MG PO TABS: ORAL_TABLET | ORAL | Status: DC

## 2014-08-06 ENCOUNTER — Encounter: Payer: Self-pay | Admitting: Gynecology

## 2014-08-06 ENCOUNTER — Ambulatory Visit (INDEPENDENT_AMBULATORY_CARE_PROVIDER_SITE_OTHER): Payer: Medicare Other | Admitting: Gynecology

## 2014-08-06 VITALS — BP 124/82 | Ht 66.5 in | Wt 144.0 lb

## 2014-08-06 DIAGNOSIS — R141 Gas pain: Secondary | ICD-10-CM

## 2014-08-06 DIAGNOSIS — M810 Age-related osteoporosis without current pathological fracture: Secondary | ICD-10-CM

## 2014-08-06 DIAGNOSIS — R142 Eructation: Secondary | ICD-10-CM

## 2014-08-06 DIAGNOSIS — R143 Flatulence: Secondary | ICD-10-CM

## 2014-08-06 DIAGNOSIS — D251 Intramural leiomyoma of uterus: Secondary | ICD-10-CM

## 2014-08-06 DIAGNOSIS — N952 Postmenopausal atrophic vaginitis: Secondary | ICD-10-CM

## 2014-08-06 DIAGNOSIS — R140 Abdominal distension (gaseous): Secondary | ICD-10-CM

## 2014-08-06 MED ORDER — DENOSUMAB 60 MG/ML ~~LOC~~ SOLN
60.0000 mg | Freq: Once | SUBCUTANEOUS | Status: AC
  Administered 2014-08-06: 60 mg via SUBCUTANEOUS

## 2014-08-06 NOTE — Patient Instructions (Signed)
Follow up for ultrasound as scheduled.  Follow up in 6 months for your next Prolia shot  You may obtain a copy of any labs that were done today by logging onto MyChart as outlined in the instructions provided with your AVS (after visit summary). The office will not call with normal lab results but certainly if there are any significant abnormalities then we will contact you.   Health Maintenance, Female A healthy lifestyle and preventative care can promote health and wellness.  Maintain regular health, dental, and eye exams.  Eat a healthy diet. Foods like vegetables, fruits, whole grains, low-fat dairy products, and lean protein foods contain the nutrients you need without too many calories. Decrease your intake of foods high in solid fats, added sugars, and salt. Get information about a proper diet from your caregiver, if necessary.  Regular physical exercise is one of the most important things you can do for your health. Most adults should get at least 150 minutes of moderate-intensity exercise (any activity that increases your heart rate and causes you to sweat) each week. In addition, most adults need muscle-strengthening exercises on 2 or more days a week.   Maintain a healthy weight. The body mass index (BMI) is a screening tool to identify possible weight problems. It provides an estimate of body fat based on height and weight. Your caregiver can help determine your BMI, and can help you achieve or maintain a healthy weight. For adults 20 years and older:  A BMI below 18.5 is considered underweight.  A BMI of 18.5 to 24.9 is normal.  A BMI of 25 to 29.9 is considered overweight.  A BMI of 30 and above is considered obese.  Maintain normal blood lipids and cholesterol by exercising and minimizing your intake of saturated fat. Eat a balanced diet with plenty of fruits and vegetables. Blood tests for lipids and cholesterol should begin at age 54 and be repeated every 5 years. If your  lipid or cholesterol levels are high, you are over 50, or you are a high risk for heart disease, you may need your cholesterol levels checked more frequently.Ongoing high lipid and cholesterol levels should be treated with medicines if diet and exercise are not effective.  If you smoke, find out from your caregiver how to quit. If you do not use tobacco, do not start.  Lung cancer screening is recommended for adults aged 64 80 years who are at high risk for developing lung cancer because of a history of smoking. Yearly low-dose computed tomography (CT) is recommended for people who have at least a 30-pack-year history of smoking and are a current smoker or have quit within the past 15 years. A pack year of smoking is smoking an average of 1 pack of cigarettes a day for 1 year (for example: 1 pack a day for 30 years or 2 packs a day for 15 years). Yearly screening should continue until the smoker has stopped smoking for at least 15 years. Yearly screening should also be stopped for people who develop a health problem that would prevent them from having lung cancer treatment.  If you are pregnant, do not drink alcohol. If you are breastfeeding, be very cautious about drinking alcohol. If you are not pregnant and choose to drink alcohol, do not exceed 1 drink per day. One drink is considered to be 12 ounces (355 mL) of beer, 5 ounces (148 mL) of wine, or 1.5 ounces (44 mL) of liquor.  Avoid use of street  drugs. Do not share needles with anyone. Ask for help if you need support or instructions about stopping the use of drugs.  High blood pressure causes heart disease and increases the risk of stroke. Blood pressure should be checked at least every 1 to 2 years. Ongoing high blood pressure should be treated with medicines, if weight loss and exercise are not effective.  If you are 71 to 70 years old, ask your caregiver if you should take aspirin to prevent strokes.  Diabetes screening involves taking a  blood sample to check your fasting blood sugar level. This should be done once every 3 years, after age 2, if you are within normal weight and without risk factors for diabetes. Testing should be considered at a younger age or be carried out more frequently if you are overweight and have at least 1 risk factor for diabetes.  Breast cancer screening is essential preventative care for women. You should practice "breast self-awareness." This means understanding the normal appearance and feel of your breasts and may include breast self-examination. Any changes detected, no matter how small, should be reported to a caregiver. Women in their 17s and 30s should have a clinical breast exam (CBE) by a caregiver as part of a regular health exam every 1 to 3 years. After age 35, women should have a CBE every year. Starting at age 90, women should consider having a mammogram (breast X-ray) every year. Women who have a family history of breast cancer should talk to their caregiver about genetic screening. Women at a high risk of breast cancer should talk to their caregiver about having an MRI and a mammogram every year.  Breast cancer gene (BRCA)-related cancer risk assessment is recommended for women who have family members with BRCA-related cancers. BRCA-related cancers include breast, ovarian, tubal, and peritoneal cancers. Having family members with these cancers may be associated with an increased risk for harmful changes (mutations) in the breast cancer genes BRCA1 and BRCA2. Results of the assessment will determine the need for genetic counseling and BRCA1 and BRCA2 testing.  The Pap test is a screening test for cervical cancer. Women should have a Pap test starting at age 8. Between ages 27 and 48, Pap tests should be repeated every 2 years. Beginning at age 28, you should have a Pap test every 3 years as long as the past 3 Pap tests have been normal. If you had a hysterectomy for a problem that was not cancer or  a condition that could lead to cancer, then you no longer need Pap tests. If you are between ages 67 and 67, and you have had normal Pap tests going back 10 years, you no longer need Pap tests. If you have had past treatment for cervical cancer or a condition that could lead to cancer, you need Pap tests and screening for cancer for at least 20 years after your treatment. If Pap tests have been discontinued, risk factors (such as a new sexual partner) need to be reassessed to determine if screening should be resumed. Some women have medical problems that increase the chance of getting cervical cancer. In these cases, your caregiver may recommend more frequent screening and Pap tests.  The human papillomavirus (HPV) test is an additional test that may be used for cervical cancer screening. The HPV test looks for the virus that can cause the cell changes on the cervix. The cells collected during the Pap test can be tested for HPV. The HPV test could be  used to screen women aged 58 years and older, and should be used in women of any age who have unclear Pap test results. After the age of 73, women should have HPV testing at the same frequency as a Pap test.  Colorectal cancer can be detected and often prevented. Most routine colorectal cancer screening begins at the age of 67 and continues through age 43. However, your caregiver may recommend screening at an earlier age if you have risk factors for colon cancer. On a yearly basis, your caregiver may provide home test kits to check for hidden blood in the stool. Use of a small camera at the end of a tube, to directly examine the colon (sigmoidoscopy or colonoscopy), can detect the earliest forms of colorectal cancer. Talk to your caregiver about this at age 53, when routine screening begins. Direct examination of the colon should be repeated every 5 to 10 years through age 90, unless early forms of pre-cancerous polyps or small growths are found.  Hepatitis C  blood testing is recommended for all people born from [birth year] through 1967 and any individual with known risks for hepatitis C.  Practice safe sex. Use condoms and avoid high-risk sexual practices to reduce the spread of sexually transmitted infections (STIs). Sexually active women aged 29 and younger should be checked for Chlamydia, which is a common sexually transmitted infection. Older women with new or multiple partners should also be tested for Chlamydia. Testing for other STIs is recommended if you are sexually active and at increased risk.  Osteoporosis is a disease in which the bones lose minerals and strength with aging. This can result in serious bone fractures. The risk of osteoporosis can be identified using a bone density scan. Women ages 17 and over and women at risk for fractures or osteoporosis should discuss screening with their caregivers. Ask your caregiver whether you should be taking a calcium supplement or vitamin D to reduce the rate of osteoporosis.  Menopause can be associated with physical symptoms and risks. Hormone replacement therapy is available to decrease symptoms and risks. You should talk to your caregiver about whether hormone replacement therapy is right for you.  Use sunscreen. Apply sunscreen liberally and repeatedly throughout the day. You should seek shade when your shadow is shorter than you. Protect yourself by wearing long sleeves, pants, a wide-brimmed hat, and sunglasses year round, whenever you are outdoors.  Notify your caregiver of new moles or changes in moles, especially if there is a change in shape or color. Also notify your caregiver if a mole is larger than the size of a pencil eraser.  Stay current with your immunizations. Document Released: 05/30/2011 Document Revised: 03/11/2013 Document Reviewed: 05/30/2011 [facility] Patient Information 2014 [place].

## 2014-08-06 NOTE — Addendum Note (Signed)
Addended by: Alen Blew on: 08/06/2014 04:53 PM   Modules accepted: Orders

## 2014-08-06 NOTE — Progress Notes (Signed)
Dana Chambers 05-Nov-1946 [id number]        68 y.o.  G1P1001 for follow up exam. Several issues noted below.  Past medical history,surgical history, problem list, medications, allergies, family history and social history were all reviewed and documented as reviewed in the EPIC chart.  ROS:  12 system ROS performed with pertinent positives and negatives included in the history, assessment and plan.   Additional significant findings :  none   Exam: Engineer, drilling Vitals:   08/06/14 1454  BP: 124/82  Height: 5' 6.5" (1.689 m)  Weight: 144 lb (65.318 kg)   General appearance:  Normal affect, orientation and appearance. Skin: Grossly normal HEENT: Without gross lesions.  No cervical or supraclavicular adenopathy. Thyroid normal.  Lungs:  Clear without wheezing, rales or rhonchi Cardiac: RR, without RMG Abdominal:  Soft, nontender, without masses, guarding, rebound, organomegaly or hernia Breasts:  Examined lying and sitting. Left without masses, retractions, discharge or axillary adenopathy.  Right breast status post lumpectomy and nipple resection with well-healed scars. No masses attractions adenopathy Pelvic:  Ext/BUS/vagina with generalized atrophic changes  Cervix with atrophic changes  Uterus axial to anteverted, normal size, shape and contour, midline and mobile nontender   Adnexa  Without masses or tenderness    Anus and perineum  Normal   Rectovaginal  Normal sphincter tone without palpated masses or tenderness.    Assessment/Plan:  68 y.o. G1P1001 female for follow up exam.   1. Abdominal bloating. Patient undergoing evaluation by Dr. Maurene Capes for generalized abdominal bloating. Patient had been doing some reading is concerned about ovarian cancer. She does have a history of leiomyoma and is status post hysteroscopic resection of a submucous myoma and polyp 2013. We'll start with ultrasound now for reassurance rule out ovarian process and relook at the endometrium. I did  schedule her for a sonohysterogram so that if the endometrial echo is thicker we'll proceed with this at that time. If it is thin and we will not. She's done no vaginal bleeding. 2. Postmenopausal state. Doing well without significant hot flushes, night sweats, vaginal dryness. No vaginal bleeding. Continue to monitor. 3. History of breast cancer. Exam NED.  Due for mammography now she knows to schedule this. SBE monthly reviewed. 4. Osteoporosis. DEXA 2014 T score -3 left forearm.  Statistical decline at both the spine and forearm. Stable at the hips. Had been on bisphosphonates for possibly 10 years and had been on a drug-free holiday for several years. Initiated Prolia this past year and is due for her shot now. 5. Pap smear 2013. No Pap smear done today. Plan repeat Pap smear next year at 3 year interval. Reviewed current screening guidelines and options to stop screening issues over the age of 85 reviewed. At this point patient's uncomfortable with that and would prefer to continue screening next year. 6. Colonoscopy 2013. Repeat at their recommended interval. 7. Health maintenance. No routine blood work done as this is done at her primary physician's office. Follow up one year, sooner as needed.   Note: This document was prepared with digital dictation and possible smart phrase technology. Any transcriptional errors that result from this process are unintentional.   Anastasio Auerbach MD, 3:22 PM 08/06/2014

## 2014-08-07 ENCOUNTER — Other Ambulatory Visit: Payer: Self-pay | Admitting: Gynecology

## 2014-08-07 DIAGNOSIS — R140 Abdominal distension (gaseous): Secondary | ICD-10-CM

## 2014-08-07 LAB — URINALYSIS W MICROSCOPIC + REFLEX CULTURE
Bacteria, UA: NONE SEEN
Bilirubin Urine: NEGATIVE
Casts: NONE SEEN
Crystals: NONE SEEN
Glucose, UA: NEGATIVE mg/dL
Hgb urine dipstick: NEGATIVE
Ketones, ur: NEGATIVE mg/dL
Nitrite: NEGATIVE
Protein, ur: NEGATIVE mg/dL
Specific Gravity, Urine: 1.022 (ref 1.005–1.030)
Squamous Epithelial / LPF: NONE SEEN
Urobilinogen, UA: 0.2 mg/dL (ref 0.0–1.0)
pH: 6 (ref 5.0–8.0)

## 2014-08-08 LAB — URINE CULTURE
Colony Count: NO GROWTH
Organism ID, Bacteria: NO GROWTH

## 2014-08-18 ENCOUNTER — Ambulatory Visit
Admission: RE | Admit: 2014-08-18 | Discharge: 2014-08-18 | Disposition: A | Discharge status: Home / Self Care | Payer: Medicare Other | Source: Ambulatory Visit

## 2014-08-18 DIAGNOSIS — Z1231 Encounter for screening mammogram for malignant neoplasm of breast: Secondary | ICD-10-CM

## 2014-08-18 DIAGNOSIS — Z9889 Other specified postprocedural states: Secondary | ICD-10-CM

## 2014-08-18 DIAGNOSIS — Z853 Personal history of malignant neoplasm of breast: Secondary | ICD-10-CM

## 2014-08-19 ENCOUNTER — Ambulatory Visit (INDEPENDENT_AMBULATORY_CARE_PROVIDER_SITE_OTHER): Payer: Medicare Other | Admitting: Internal Medicine

## 2014-08-19 ENCOUNTER — Encounter: Payer: Self-pay | Admitting: Internal Medicine

## 2014-08-19 ENCOUNTER — Other Ambulatory Visit (INDEPENDENT_AMBULATORY_CARE_PROVIDER_SITE_OTHER): Payer: Medicare Other

## 2014-08-19 VITALS — BP 120/70 | HR 84 | Ht 66.5 in | Wt 143.1 lb

## 2014-08-19 DIAGNOSIS — K219 Gastro-esophageal reflux disease without esophagitis: Secondary | ICD-10-CM

## 2014-08-19 DIAGNOSIS — R1013 Epigastric pain: Secondary | ICD-10-CM

## 2014-08-19 DIAGNOSIS — K3189 Other diseases of stomach and duodenum: Secondary | ICD-10-CM

## 2014-08-19 LAB — IGA: IgA: 78 mg/dL (ref 68–378)

## 2014-08-19 MED ORDER — DICYCLOMINE HCL 10 MG PO CAPS: 10.0000 mg | ORAL_CAPSULE | Freq: Every day | ORAL | Status: DC

## 2014-08-19 NOTE — Patient Instructions (Addendum)
Your physician has requested that you go to the basement for the following lab work before leaving today: IgA, Celiac   We have sent the following medications to your pharmacy for you to pick up at your convenience: Bentyl  Please discontinue Zantac, carafate.  Please follow up in the office in 2 months.  Take Probiotics daily. Dr Sherren Mocha

## 2014-08-19 NOTE — Progress Notes (Signed)
Dana Chambers Mar 12, 1946 [id number]  Note: This dictation was prepared with Dragon digital system. Any transcriptional errors that result from this procedure are unintentional.   History of Present Illness:  This is a 68 year old, white female with irritable bowel syndrome and gastroesophageal reflux disease. Her last appointment was in September 2014. She has been waking up at night with an uncomfortable feeling in her mid abdomen as well as loud noises and gurgling. She denies vomiting or nausea. Her bowel habits have been regular. An upper abdominal ultrasound in November 2014 was normal. An upper endoscopy in January 2013 show chronic gastritis, H. pylori negative. Her last colonoscopy in January 2013 revealed a hyperplastic polyp. She has a positive family history of colon cancer in a grandparent. She is currently on Nexium 40 mg twice a day, Zantac and Carafate 10 cc twice a day when necessary.    Past Medical History  Diagnosis Date  . GERD (gastroesophageal reflux disease)   . IBS (irritable bowel syndrome)   . Hyperplastic colon polyp   . Osteoporosis 2014    T score -3.0 left forearm  . Liver cyst     Benign  . Rosacea   . Tinnitus   . History of breast cancer     2006  S/P RIGHT LUMPECTOMY AND RADIATION FOLLOWED BY Dana Chambers--  NO RECURRENCE  . Internal hemorrhoid   . OA (osteoarthritis)   . History of kidney stones   . Renal calculus, left     non obstructive  . Idiopathic peripheral neuropathy     bilateral tingling  . Leiomyoma     Multiple small  . Diverticulosis   . Kidney stone   . Bursitis     right hip    Past Surgical History  Procedure Laterality Date  . Supranumerary nipple excision  1973    CYST  . Extracorporeal shock wave lithotripsy      X2  . Tonsillectomy  AGE 50  . Breast lumpectomy  2006     Right -- S/P RADIATION--  NO RECURRENCE  . Hysteroscopy w/d&c  08/03/2012    Procedure: DILATATION AND CURETTAGE /HYSTEROSCOPY;  Surgeon: Dana Auerbach, MD;  Location: [facility];  Service: Gynecology;  Laterality: N/A;  . Dilation and curettage of uterus    . Hysteroscopy      Allergies  Allergen Reactions  . Amoxicillin Diarrhea    GI upset    Family history and social history have been reviewed.  Review of Systems:   The remainder of the 10 point ROS is negative except as outlined in the H&P  Physical Exam: General Appearance Well developed, in no distress Eyes  Non icteric  HEENT  Non traumatic, normocephalic  Mouth No lesion, tongue papillated, no cheilosis Neck Supple without adenopathy, thyroid not enlarged, no carotid bruits, no JVD Lungs Clear to auscultation bilaterally COR Normal S1, normal S2, regular rhythm, no murmur, quiet precordium Abdomen soft nontender with normoactive bowel sounds. No abnormal bowel sounds. No tympany. Liver edge at costal margin Rectal soft Hemoccult negative stool Extremities  No pedal edema Skin No lesions Neurological Alert and oriented x 3 Psychological Normal mood and affect  Assessment and Plan:   Problem #52 68 year old white female with irritable bowel syndrome and nocturnal abdominal discomfort suggestive of either bacteria overgrowth or irritable bowel syndrome. She is scheduled for a pelvic ultrasound by Dana Chambers. I will check her sprue profile to rule out gluten sensitivity. We will give her Bentyl 10  mg at bedtime as an antispasmodic and I advised her to resume probiotics for possible bacterial overgrowth. She will be able to discontinue Zantac and Carafate. I will see her in 2 months. Prolia which she takes for osteoporosis has a side effect of flatulence/gasiness.    Dana Chambers 08/19/2014

## 2014-08-20 LAB — CELIAC PANEL 10
Endomysial Screen: NEGATIVE
Gliadin IgA: 5 U/mL (ref <20)
Gliadin IgG: 5.3 U/mL (ref <20)
IgA: 85 mg/dL (ref 69–380)
Tissue Transglut Ab: 6 U/mL (ref <20)
Tissue Transglutaminase Ab, IgA: 4.2 U/mL (ref <20)

## 2014-08-25 ENCOUNTER — Encounter: Payer: Self-pay | Admitting: Internal Medicine

## 2014-08-27 ENCOUNTER — Ambulatory Visit (INDEPENDENT_AMBULATORY_CARE_PROVIDER_SITE_OTHER): Payer: Medicare Other | Admitting: Gynecology

## 2014-08-27 ENCOUNTER — Encounter: Payer: Self-pay | Admitting: Gynecology

## 2014-08-27 ENCOUNTER — Ambulatory Visit (INDEPENDENT_AMBULATORY_CARE_PROVIDER_SITE_OTHER): Payer: Medicare Other

## 2014-08-27 ENCOUNTER — Other Ambulatory Visit: Payer: Self-pay | Admitting: Gynecology

## 2014-08-27 DIAGNOSIS — R142 Eructation: Secondary | ICD-10-CM

## 2014-08-27 DIAGNOSIS — R141 Gas pain: Secondary | ICD-10-CM

## 2014-08-27 DIAGNOSIS — R9389 Abnormal findings on diagnostic imaging of other specified body structures: Secondary | ICD-10-CM

## 2014-08-27 DIAGNOSIS — R140 Abdominal distension (gaseous): Secondary | ICD-10-CM

## 2014-08-27 DIAGNOSIS — D251 Intramural leiomyoma of uterus: Secondary | ICD-10-CM

## 2014-08-27 DIAGNOSIS — R143 Flatulence: Secondary | ICD-10-CM

## 2014-08-27 DIAGNOSIS — D259 Leiomyoma of uterus, unspecified: Secondary | ICD-10-CM

## 2014-08-27 NOTE — Progress Notes (Addendum)
Dana Chambers 04-10-1946 [id number]        68 y.o.  G1P1001 Presents for sonohysterogram due to history of abdominal bloating. 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  No history of bleeding or other abnormalities at this time. Ultrasound order to clear the ovaries as a source of her abdominal bloating.  Past medical history,surgical history, problem list, medications, allergies, family history and social history were all reviewed and documented in the EPIC chart.  Directed ROS with pertinent positives and negatives documented in the history of present illness/assessment and plan.  Exam: Dana Chambers assistant General appearance:  Normal External BUS vagina with atrophic changes. Cervix with atrophic changes. Bimanual without masses or tenderness.   Ultrasound shows uterus normal size with multiple myomas the largest measuring 27 mm. Endometrial echo 6.8 mm.  Right and left ovaries visualized and atrophic. Cul-de-sac is negative.  Sonohysterogram performed, sterile technique, easy catheter introduction, good distention with questionable small 9 mm polyp. Difficult to visualize due to shadowing from the myoma. Endometrial sample taken. Patient tolerated well.  Assessment/Plan:  68 y.o. G1P1001 presents for ultrasound to clear the ovaries as the source of her, bloating. Both ovaries visualized and atrophic. Patient actually undergoing evaluation and treatment by Dr. Olevia Chambers. Does have a history of submucous myoma and endometrial polyp. I scheduled the sonohysterogram in the event the cavity looked thickened to allow to proceed directly with it. She's done the bleeding historically. The endometrial echo was more prominent and the sonohysterogram was performed with questionable small endometrial polyp. Somewhat difficult to visualize. Initial sample was taken. Options for management assuming endometrial biopsy negative would be hysteroscopy D&C now, no  further evaluation, repeat ultrasound in several months to relook at this area. Assuming the endometrial biopsy is negative we both agreed on repeating the sonohysterogram in 3 months just to relook at this area and then go from there and patient's comfortable with this.     Dana Auerbach MD, 11:52 AM 08/27/2014

## 2014-08-27 NOTE — Patient Instructions (Addendum)
Follow up for repeat ultrasound in 3 months. Check with the office before getting your next Prolia shot so we can decide if we want to continue this. Office will call you with biopsy results.

## 2014-09-02 ENCOUNTER — Ambulatory Visit (INDEPENDENT_AMBULATORY_CARE_PROVIDER_SITE_OTHER): Payer: Medicare Other | Admitting: *Deleted

## 2014-09-02 DIAGNOSIS — Z23 Encounter for immunization: Secondary | ICD-10-CM

## 2014-09-03 ENCOUNTER — Other Ambulatory Visit: Payer: Self-pay | Admitting: *Deleted

## 2014-09-03 MED ORDER — ESOMEPRAZOLE MAGNESIUM 40 MG PO CPDR: 40.0000 mg | DELAYED_RELEASE_CAPSULE | Freq: Two times a day (BID) | ORAL | Status: DC

## 2014-09-29 ENCOUNTER — Encounter: Payer: Self-pay | Admitting: Gynecology

## 2014-10-02 ENCOUNTER — Other Ambulatory Visit (INDEPENDENT_AMBULATORY_CARE_PROVIDER_SITE_OTHER): Payer: Medicare Other

## 2014-10-02 DIAGNOSIS — Z79899 Other long term (current) drug therapy: Secondary | ICD-10-CM

## 2014-10-02 DIAGNOSIS — Z0000 Encounter for general adult medical examination without abnormal findings: Secondary | ICD-10-CM

## 2014-10-02 LAB — CBC WITH DIFFERENTIAL/PLATELET
Basophils Absolute: 0 10*3/uL (ref 0.0–0.1)
Basophils Relative: 0.6 % (ref 0.0–3.0)
Eosinophils Absolute: 0.1 10*3/uL (ref 0.0–0.7)
Eosinophils Relative: 2.3 % (ref 0.0–5.0)
HCT: 40.2 % (ref 36.0–46.0)
Hemoglobin: 13.5 g/dL (ref 12.0–15.0)
Lymphocytes Relative: 38.7 % (ref 12.0–46.0)
Lymphs Abs: 2.3 10*3/uL (ref 0.7–4.0)
MCHC: 33.5 g/dL (ref 30.0–36.0)
MCV: 91.1 fl (ref 78.0–100.0)
Monocytes Absolute: 0.5 10*3/uL (ref 0.1–1.0)
Monocytes Relative: 7.7 % (ref 3.0–12.0)
Neutro Abs: 3 10*3/uL (ref 1.4–7.7)
Neutrophils Relative %: 50.7 % (ref 43.0–77.0)
Platelets: 277 10*3/uL (ref 150.0–400.0)
RBC: 4.42 Mil/uL (ref 3.87–5.11)
RDW: 13.6 % (ref 11.5–15.5)
WBC: 6 10*3/uL (ref 4.0–10.5)

## 2014-10-02 LAB — BASIC METABOLIC PANEL
BUN: 21 mg/dL (ref 6–23)
CO2: 26 mEq/L (ref 19–32)
Calcium: 9 mg/dL (ref 8.4–10.5)
Chloride: 106 mEq/L (ref 96–112)
Creatinine, Ser: 0.8 mg/dL (ref 0.4–1.2)
GFR: 72.23 mL/min (ref >60.00)
Glucose, Bld: 93 mg/dL (ref 70–99)
Potassium: 5 mEq/L (ref 3.5–5.1)
Sodium: 143 mEq/L (ref 135–145)

## 2014-10-02 LAB — LIPID PANEL
Cholesterol: 262 mg/dL — ABNORMAL HIGH (ref 0–200)
HDL: 62.9 mg/dL (ref >39.00)
LDL Cholesterol: 187 mg/dL — ABNORMAL HIGH (ref 0–99)
NonHDL: 199.1
Total CHOL/HDL Ratio: 4
Triglycerides: 60 mg/dL (ref 0.0–149.0)
VLDL: 12 mg/dL (ref 0.0–40.0)

## 2014-10-02 LAB — POCT URINALYSIS DIPSTICK
Bilirubin, UA: NEGATIVE
Blood, UA: NEGATIVE
Glucose, UA: NEGATIVE
Ketones, UA: NEGATIVE
Leukocytes, UA: NEGATIVE
Nitrite, UA: NEGATIVE
Protein, UA: NEGATIVE
Spec Grav, UA: 1.015
Urobilinogen, UA: 0.2
pH, UA: 7.5

## 2014-10-02 LAB — TSH: TSH: 1.11 u[IU]/mL (ref 0.35–4.50)

## 2014-10-02 LAB — HEPATIC FUNCTION PANEL
ALT: 18 U/L (ref 0–35)
AST: 25 U/L (ref 0–37)
Albumin: 3.7 g/dL (ref 3.5–5.2)
Alkaline Phosphatase: 57 U/L (ref 39–117)
Bilirubin, Direct: 0 mg/dL (ref 0.0–0.3)
Total Bilirubin: 0.5 mg/dL (ref 0.2–1.2)
Total Protein: 7.1 g/dL (ref 6.0–8.3)

## 2014-10-03 ENCOUNTER — Encounter: Payer: Self-pay | Admitting: Internal Medicine

## 2014-10-03 ENCOUNTER — Ambulatory Visit (INDEPENDENT_AMBULATORY_CARE_PROVIDER_SITE_OTHER): Payer: Medicare Other | Admitting: Internal Medicine

## 2014-10-03 VITALS — BP 118/72 | HR 76 | Ht 66.5 in | Wt 147.1 lb

## 2014-10-03 DIAGNOSIS — R1013 Epigastric pain: Secondary | ICD-10-CM

## 2014-10-03 DIAGNOSIS — R1012 Left upper quadrant pain: Secondary | ICD-10-CM

## 2014-10-03 MED ORDER — DICYCLOMINE HCL 10 MG PO CAPS
10.0000 mg | ORAL_CAPSULE | Freq: Every day | ORAL | Status: DC
Start: 2014-10-03 — End: 2015-02-27

## 2014-10-03 MED ORDER — ESOMEPRAZOLE MAGNESIUM 40 MG PO CPDR: 40.0000 mg | DELAYED_RELEASE_CAPSULE | Freq: Two times a day (BID) | ORAL | Status: DC

## 2014-10-03 NOTE — Progress Notes (Signed)
Dana Chambers 21-Jun-1946 [id number]  Note: This dictation was prepared with Dragon digital system. Any transcriptional errors that result from this procedure are unintentional.   History of Present Illness: This is a  follow-up  office visit ifrom September 2015  for irritable bowel syndrome and gastroesophageal reflux disease. She was having an  mid abdominal pain and left upper quadrant and middle quadrant  which was occurring at night but now it has resolved after Nexium 40 mg started  twice a day and Bentyl10 mg at bedtime. Her GI evaluation has been  normal including a normal upper abdominal ultrasound in November 2014, upper endoscopy in January 2013 which showed only gastritis  CLOtest negative. Colonoscopy in January 2013. She had hyperplastic polyps. There is a family history of colon cancer in a grandparent. She just completely gastric emptying scan in August 2015 which showed normal gastric emptying her pain has improved enough that she was able to discontinue Bentyl but she is still taking Nexium twice a day. She needs a refill which will require preauthorization    Past Medical History  Diagnosis Date  . GERD (gastroesophageal reflux disease)   . IBS (irritable bowel syndrome)   . Hyperplastic colon polyp   . Osteoporosis 2014    T score -3.0 left forearm  . Liver cyst     Benign  . Rosacea   . Tinnitus   . History of breast cancer     2006  S/P RIGHT LUMPECTOMY AND RADIATION FOLLOWED BY DR RUBIN--  NO RECURRENCE  . Internal hemorrhoid   . OA (osteoarthritis)   . History of kidney stones   . Renal calculus, left     non obstructive  . Idiopathic peripheral neuropathy     bilateral tingling  . Leiomyoma     Multiple small  . Diverticulosis   . Kidney stone   . Bursitis     right hip    Past Surgical History  Procedure Laterality Date  . Supranumerary nipple excision  1973    CYST  . Extracorporeal shock wave lithotripsy      X2  . Tonsillectomy  AGE 34  .  Breast lumpectomy  2006     Right -- S/P RADIATION--  NO RECURRENCE  . Hysteroscopy w/d&c  08/03/2012    Procedure: DILATATION AND CURETTAGE /HYSTEROSCOPY;  Surgeon: Anastasio Auerbach, MD;  Location: [facility];  Service: Gynecology;  Laterality: N/A;  . Dilation and curettage of uterus    . Hysteroscopy      Allergies  Allergen Reactions  . Amoxicillin Diarrhea    GI upset    Family history and social history have been reviewed.  Review of Systems: denies diarrhea, rectal bleeding, fever or weight loss  The remainder of the 10 point ROS is negative except as outlined in the H&P  Physical Exam: General Appearance Well developed, in no distress Eyes  Non icteric  HEENT  Non traumatic, normocephalic  Mouth No lesion, tongue papillated, no cheilosis Neck Supple without adenopathy, thyroid not enlarged, no carotid bruits, no JVD Lungs Clear to auscultation bilaterally COR Normal S1, normal S2, regular rhythm, no murmur, quiet precordium Abdomen soft minimally tender left middle quadrant and left upper quadrant. No pulsations or mass. Gastrium is normal. There is no CVA tenderness Rectal not repeated Extremities  No pedal edema Skin No lesions Neurological Alert and oriented x 3 Psychological Normal mood and affect  Assessment and Plan:   Emily-year-old white female with irritable bowel  syndrome and history of gastritis. She has improved on Nexium which will be reduced to 40 mg once a day and she will continue Bentyl 10 mg on when necessary basis. No further testing is planned at this time. I would like to see her in 6 months. If the symptoms continue consider repeat upper endoscopy or switching her PPI   [name] 10/03/2014

## 2014-10-03 NOTE — Patient Instructions (Signed)
We have sent the following medications to your pharmacy for you to pick up at your convenience: Nexium Bentyl 10 mg  Please follow up with Dr Olevia Perches in 6 months.  CC:Dr Rhunette Croft

## 2014-10-04 ENCOUNTER — Encounter: Payer: Self-pay | Admitting: Internal Medicine

## 2014-10-09 ENCOUNTER — Encounter: Payer: Self-pay | Admitting: Family Medicine

## 2014-10-09 ENCOUNTER — Ambulatory Visit (INDEPENDENT_AMBULATORY_CARE_PROVIDER_SITE_OTHER): Payer: Medicare Other | Admitting: Family Medicine

## 2014-10-09 VITALS — BP 124/80 | Temp 98.5°F | Ht 67.5 in | Wt 148.0 lb

## 2014-10-09 DIAGNOSIS — D0591 Unspecified type of carcinoma in situ of right breast: Secondary | ICD-10-CM

## 2014-10-09 DIAGNOSIS — K589 Irritable bowel syndrome without diarrhea: Secondary | ICD-10-CM

## 2014-10-09 DIAGNOSIS — K219 Gastro-esophageal reflux disease without esophagitis: Secondary | ICD-10-CM

## 2014-10-09 DIAGNOSIS — M899 Disorder of bone, unspecified: Secondary | ICD-10-CM

## 2014-10-09 DIAGNOSIS — M949 Disorder of cartilage, unspecified: Secondary | ICD-10-CM

## 2014-10-09 DIAGNOSIS — D126 Benign neoplasm of colon, unspecified: Secondary | ICD-10-CM

## 2014-10-09 DIAGNOSIS — G609 Hereditary and idiopathic neuropathy, unspecified: Secondary | ICD-10-CM

## 2014-10-09 NOTE — Patient Instructions (Signed)
Continue daily exercise and medications  Follow-up in 1 year sooner if any problems

## 2014-10-09 NOTE — Progress Notes (Signed)
Pre visit review using our clinic review tool, if applicable. No additional management support is needed unless otherwise documented below in the visit note. 

## 2014-10-09 NOTE — Progress Notes (Signed)
   Subjective:    Patient ID: Dana Chambers, female    DOB: January 30, 1946, 68 y.o.   MRN: [id number]  HPI Dana Chambers is a 68 year old female married nonsmoker who comes in today for general physical examination  She sees Dr. Phineas Real. She is on [name] 60 mg every 6 months because of osteoporosis calcium and vitamin D and exercise. She takes Bentyl and Nexium from Dr. Maurene Capes for IBS and hyperacidity  She gets routine eye care, dental care, BSE monthly.......Marland Kitchen Lumpectomy and postop radiation years ago for a bright breast cancer...Marland KitchenMarland KitchenMarland Kitchen Vaccinations updated by Apolonio Schneiders recent colonoscopy normal 2013.  Cognitive function normal she walks daily home health safety reviewed no issues identified, no guns in the house, she does have a healthcare power of attorney and living well   Review of Systems  Constitutional: Negative.   HENT: Negative.   Eyes: Negative.   Respiratory: Negative.   Cardiovascular: Negative.   Gastrointestinal: Negative.   Endocrine: Negative.   Genitourinary: Negative.   Musculoskeletal: Negative.   Skin: Negative.   Allergic/Immunologic: Negative.   Neurological: Negative.   Hematological: Negative.   Psychiatric/Behavioral: Negative.        Objective:   Physical Exam  Constitutional: She appears well-developed and well-nourished.  HENT:  Head: Normocephalic and atraumatic.  Right Ear: External ear normal.  Left Ear: External ear normal.  Nose: Nose normal.  Mouth/Throat: Oropharynx is clear and moist.  Eyes: EOM are normal. Pupils are equal, round, and reactive to light.  Neck: Normal range of motion. Neck supple. No JVD present. No tracheal deviation present. No thyromegaly present.  Cardiovascular: Normal rate, regular rhythm, normal heart sounds and intact distal pulses.  Exam reveals no gallop and no friction rub.   No murmur heard. No carotid nor aortic bruits peripheral pulses 2+ and symmetrical  Pulmonary/Chest: Effort normal and breath sounds normal. No stridor.  No respiratory distress. She has no wheezes. She has no rales. She exhibits no tenderness.  Abdominal: Soft. Bowel sounds are normal. She exhibits no distension and no mass. There is no tenderness. There is no rebound and no guarding.  Genitourinary:  Left breast normal right breast normal tattoos and scars from previous lumpectomy and postop radiation  Musculoskeletal: Normal range of motion.  Lymphadenopathy:    She has no cervical adenopathy.  Neurological: She is alert. She has normal reflexes. No cranial nerve deficit. She exhibits normal muscle tone. Coordination normal.  Skin: Skin is warm and dry. No rash noted. No erythema. No pallor.  Total body skin exam normal  Psychiatric: She has a normal mood and affect. Her behavior is normal. Judgment and thought content normal.  Nursing note and vitals reviewed.         Assessment & Plan:  Healthy female  History of breast cancer right,,,,,, status post lumpectomy and postop radiation,,,,,, asymptomatic routine follow-up  Hyperacidity,,,,,,,,,,,, Nexium and treatment by Dr. Maurene Capes

## 2014-10-15 ENCOUNTER — Other Ambulatory Visit: Payer: Self-pay | Admitting: Gynecology

## 2014-10-27 ENCOUNTER — Other Ambulatory Visit: Payer: Self-pay | Admitting: Gynecology

## 2014-10-27 DIAGNOSIS — R140 Abdominal distension (gaseous): Secondary | ICD-10-CM

## 2014-10-27 DIAGNOSIS — R935 Abnormal findings on diagnostic imaging of other abdominal regions, including retroperitoneum: Secondary | ICD-10-CM

## 2014-11-17 ENCOUNTER — Ambulatory Visit (INDEPENDENT_AMBULATORY_CARE_PROVIDER_SITE_OTHER): Payer: Medicare Other | Admitting: Gynecology

## 2014-11-17 ENCOUNTER — Ambulatory Visit (INDEPENDENT_AMBULATORY_CARE_PROVIDER_SITE_OTHER): Payer: Medicare Other

## 2014-11-17 ENCOUNTER — Encounter: Payer: Self-pay | Admitting: Gynecology

## 2014-11-17 ENCOUNTER — Other Ambulatory Visit: Payer: Self-pay | Admitting: Gynecology

## 2014-11-17 ENCOUNTER — Other Ambulatory Visit: Payer: Medicare Other

## 2014-11-17 ENCOUNTER — Ambulatory Visit: Payer: Medicare Other | Admitting: Gynecology

## 2014-11-17 DIAGNOSIS — R935 Abnormal findings on diagnostic imaging of other abdominal regions, including retroperitoneum: Secondary | ICD-10-CM

## 2014-11-17 DIAGNOSIS — N888 Other specified noninflammatory disorders of cervix uteri: Secondary | ICD-10-CM

## 2014-11-17 DIAGNOSIS — R9389 Abnormal findings on diagnostic imaging of other specified body structures: Secondary | ICD-10-CM

## 2014-11-17 DIAGNOSIS — N840 Polyp of corpus uteri: Secondary | ICD-10-CM

## 2014-11-17 DIAGNOSIS — R938 Abnormal findings on diagnostic imaging of other specified body structures: Secondary | ICD-10-CM

## 2014-11-17 DIAGNOSIS — R140 Abdominal distension (gaseous): Secondary | ICD-10-CM

## 2014-11-17 DIAGNOSIS — D251 Intramural leiomyoma of uterus: Secondary | ICD-10-CM

## 2014-11-17 NOTE — Patient Instructions (Signed)
Office will call you with biopsy results 

## 2014-11-17 NOTE — Progress Notes (Signed)
Dana Chambers 02/29/1946 [id number]        68 y.o.  G1P1001 Presents for follow up ultrasound. Had ultrasound previously for abdominal bloating which was normal but questionable small endometrial polyp concerning hysterogram. Endometrial biopsy showed atrophic endometrium. We asked her to return in several months just to relook at the endometrium rather than proceeding with hysteroscopy immediately.  Past medical history,surgical history, problem list, medications, allergies, family history and social history were all reviewed and documented in the EPIC chart.  Directed ROS with pertinent positives and negatives documented in the history of present illness/assessment and plan.  Exam: [name] assistant General appearance:  Normal External BUS vagina with atrophic changes. Cervix with atrophic changes.  Ultrasound shows uterus with 2 small myomas one calcified, 24 mm and 18 mm. Also with nabothian cyst. Endometrial echo 3.9 mm. Right and left ovaries small normal. Cul-de-sac negative.  Sonohysterogram performed, sterile technique, easy catheter introduction, good distention with no clear polyp noted. Endometrial biopsy taken. Patient tolerated well.  Assessment/Plan:  68 y.o. G1P1001 with question of a small polyp previously now gone. Endometrial echo thin. Patient will follow up for biopsy results. Assuming atrophic or and adequate then follow expectantly.     Anastasio Auerbach MD, 1:52 PM 11/17/2014

## 2014-11-26 ENCOUNTER — Ambulatory Visit: Payer: Medicare Other | Admitting: Gynecology

## 2014-11-26 ENCOUNTER — Other Ambulatory Visit: Payer: Medicare Other

## 2014-11-28 HISTORY — PX: BREAST BIOPSY: SHX20

## 2014-12-11 ENCOUNTER — Encounter: Payer: Self-pay | Admitting: Family Medicine

## 2014-12-11 ENCOUNTER — Other Ambulatory Visit: Payer: Self-pay | Admitting: Family Medicine

## 2014-12-11 ENCOUNTER — Ambulatory Visit (INDEPENDENT_AMBULATORY_CARE_PROVIDER_SITE_OTHER): Payer: 59 | Admitting: Family Medicine

## 2014-12-11 VITALS — BP 110/80 | Temp 98.0°F | Wt 147.0 lb

## 2014-12-11 DIAGNOSIS — D367 Benign neoplasm of other specified sites: Secondary | ICD-10-CM

## 2014-12-11 DIAGNOSIS — R2242 Localized swelling, mass and lump, left lower limb: Secondary | ICD-10-CM

## 2014-12-11 NOTE — Progress Notes (Signed)
Pre visit review using our clinic review tool, if applicable. No additional management support is needed unless otherwise documented below in the visit note. 

## 2014-12-11 NOTE — Progress Notes (Signed)
   Subjective:    Patient ID: Dana Chambers, female    DOB: Jun 08, 1946, 69 y.o.   MRN: [id number]  HPI Dana Chambers is a 69 year old female who comes in today for evaluation of a lump in her left anterior thigh  She noticed this about a week ago. No history of trauma  No fever chills night sweats etc.   Review of Systems Review of systems otherwise negative    Objective:   Physical Exam Well-developed well-nourished female no acute distress vital signs stable she's afebrile examination of left leg shows a golf ball size lesion left mid anterior thigh. It soft and rubbery and movable       Assessment & Plan:  Lesion left anterior thigh probably benign however because of its size and the fact that it somewhat firm............... I think it should be surgically removed. We'll refer to Dr. Serita Grammes

## 2014-12-11 NOTE — Patient Instructions (Signed)
[phone]........ Gen. surgery office and asked to be seen by Dr. Serita Grammes or Dr. Marlou Starks next week

## 2014-12-23 ENCOUNTER — Other Ambulatory Visit (INDEPENDENT_AMBULATORY_CARE_PROVIDER_SITE_OTHER): Payer: Self-pay | Admitting: General Surgery

## 2014-12-23 DIAGNOSIS — R2242 Localized swelling, mass and lump, left lower limb: Secondary | ICD-10-CM

## 2014-12-25 ENCOUNTER — Other Ambulatory Visit (INDEPENDENT_AMBULATORY_CARE_PROVIDER_SITE_OTHER): Payer: Self-pay

## 2014-12-25 ENCOUNTER — Ambulatory Visit
Admission: RE | Admit: 2014-12-25 | Discharge: 2014-12-25 | Disposition: A | Discharge status: Home / Self Care | Payer: 59 | Source: Ambulatory Visit | Attending: General Surgery | Admitting: General Surgery

## 2014-12-25 ENCOUNTER — Other Ambulatory Visit: Payer: Self-pay | Admitting: General Surgery

## 2014-12-25 DIAGNOSIS — R2242 Localized swelling, mass and lump, left lower limb: Secondary | ICD-10-CM

## 2014-12-25 DIAGNOSIS — M7989 Other specified soft tissue disorders: Secondary | ICD-10-CM

## 2014-12-31 ENCOUNTER — Other Ambulatory Visit (INDEPENDENT_AMBULATORY_CARE_PROVIDER_SITE_OTHER): Payer: Self-pay | Admitting: General Surgery

## 2015-01-02 ENCOUNTER — Other Ambulatory Visit (INDEPENDENT_AMBULATORY_CARE_PROVIDER_SITE_OTHER): Payer: Self-pay | Admitting: General Surgery

## 2015-01-02 DIAGNOSIS — R2242 Localized swelling, mass and lump, left lower limb: Secondary | ICD-10-CM

## 2015-01-05 ENCOUNTER — Other Ambulatory Visit (INDEPENDENT_AMBULATORY_CARE_PROVIDER_SITE_OTHER): Payer: Self-pay | Admitting: General Surgery

## 2015-01-16 ENCOUNTER — Ambulatory Visit
Admission: RE | Admit: 2015-01-16 | Discharge: 2015-01-16 | Disposition: A | Discharge status: Home / Self Care | Payer: PRIVATE HEALTH INSURANCE | Source: Ambulatory Visit | Attending: General Surgery | Admitting: General Surgery

## 2015-01-16 MED ORDER — GADOBENATE DIMEGLUMINE 529 MG/ML IV SOLN
14.0000 mL | Freq: Once | INTRAVENOUS | Status: AC | PRN
  Administered 2015-01-16: 14 mL via INTRAVENOUS

## 2015-02-16 ENCOUNTER — Telehealth: Payer: Self-pay | Admitting: Gynecology

## 2015-02-16 NOTE — Telephone Encounter (Signed)
Phone call to Dana Chambers, Dana Chambers benefits are No deductible, Ov $0 , Prolia co-pay $ 50 up to  OOP $ 4000 ($296 met), Cost to pt. $50. No PA required. Patient due after 02/05/15, Calcium level  9.0 09/2014. Patient will come in  3/24 at 11 am for injection. Next injection due after 08/22/15.

## 2015-02-17 NOTE — Telephone Encounter (Addendum)
Re submitted insurance benefits due to group number incorrect.

## 2015-02-17 NOTE — Telephone Encounter (Addendum)
Received benefits from insurance, No Deductible, Prolia drug co pay $50 toward OOP Max $4000 (296 met), also $40 co-pay with or without office visit. Total cost to pt $ 90.00. Patient would like to come in and see Dr. Phineas Real and receive injection at the same time. She has several questions regarding her general health. I will ask Rosemarie Ax to call and schedule. Patient will come in on 02/27/15 to see Dr Phineas Real and receive Prolia. Next injection will be due after 08/30/15

## 2015-02-19 ENCOUNTER — Other Ambulatory Visit: Payer: Self-pay

## 2015-02-27 ENCOUNTER — Ambulatory Visit (INDEPENDENT_AMBULATORY_CARE_PROVIDER_SITE_OTHER): Payer: Medicare Other | Admitting: Gynecology

## 2015-02-27 ENCOUNTER — Encounter: Payer: Self-pay | Admitting: Gynecology

## 2015-02-27 VITALS — BP 124/76

## 2015-02-27 DIAGNOSIS — M810 Age-related osteoporosis without current pathological fracture: Secondary | ICD-10-CM | POA: Diagnosis not present

## 2015-02-27 DIAGNOSIS — Z79899 Other long term (current) drug therapy: Secondary | ICD-10-CM

## 2015-02-27 MED ORDER — DENOSUMAB 60 MG/ML ~~LOC~~ SOLN
60.0000 mg | Freq: Once | SUBCUTANEOUS | Status: AC
  Administered 2015-02-27: 60 mg via SUBCUTANEOUS

## 2015-02-27 NOTE — Patient Instructions (Signed)
Follow up for bone density and annual exam this coming fall.

## 2015-02-27 NOTE — Addendum Note (Signed)
Addended by: Nelva Nay on: 02/27/2015 10:04 AM   Modules accepted: Orders

## 2015-02-27 NOTE — Progress Notes (Signed)
Dana Chambers 29-Mar-1946 [id number]        69 y.o.  G1P1001 Presents for her Prolia injection. This is her third shot. She did have some questions as far as how long she needs to be on the medication.  History of osteoporosis with last DEXA 09/2013. Has been on Prolia since then without significant side effects. Will plan repeat DEXA this coming fall. Assuming a good response I recommended that we stay on the Prolia for another 2 years and recheck a bone density at that point. We will then discuss whether to have a drug-free holiday at that time. Patient agrees with the plan.  Past medical history,surgical history, problem list, medications, allergies, family history and social history were all reviewed and documented in the EPIC chart.  Directed ROS with pertinent positives and negatives documented in the history of present illness/assessment and plan.  Exam: Filed Vitals:   02/27/15 0930  BP: 124/76     Assessment/Plan:  69 y.o. G1P1001 with osteoporosis on Prolia. Discussion as above. Patient received her third Prolia today. Follow up this coming fall for her annual exam, DEXA and Prolia at 6 month interval.    Anastasio Auerbach MD, 9:51 AM 02/27/2015

## 2015-03-24 ENCOUNTER — Telehealth: Payer: Self-pay | Admitting: *Deleted

## 2015-03-24 ENCOUNTER — Encounter: Payer: Self-pay | Admitting: Gynecology

## 2015-03-24 ENCOUNTER — Ambulatory Visit (INDEPENDENT_AMBULATORY_CARE_PROVIDER_SITE_OTHER): Payer: Medicare Other | Admitting: Gynecology

## 2015-03-24 VITALS — BP 130/80

## 2015-03-24 DIAGNOSIS — N644 Mastodynia: Secondary | ICD-10-CM | POA: Diagnosis not present

## 2015-03-24 NOTE — Progress Notes (Signed)
Dana Chambers 1946-09-21 [id number]        69 y.o.  G1P1001 presents with one-week history of right breast discomfort. [place] the beach and slept on her right side and since then has had right axillary and right breast discomfort. No clear palpable abnormalities. The axillary discomfort is getting better but the right breast discomfort has persisted.  She is status post right lumpectomy for pageants disease.  Past medical history,surgical history, problem list, medications, allergies, family history and social history were all reviewed and documented in the EPIC chart.  Directed ROS with pertinent positives and negatives documented in the history of present illness/assessment and plan.  Exam: [place] assistant Filed Vitals:   03/24/15 1226  BP: 130/80   General appearance:  Normal Both breast examined lying and sitting. Left without masses retractions discharge adenopathy. Right status post lumpectomy with excision of the nipple. No palpable masses or axillary adenopathy.  Assessment/Plan:  69 y.o. G1P1001 with history and exam as above. Will start with diagnostic mammography and ultrasound of the right breast. If negative and pain resolves and will follow. If negative and pain persists I discussed possible MRI. If any abnormalities them will triage based on these results.    Anastasio Auerbach MD, 12:52 PM 03/24/2015

## 2015-03-24 NOTE — Telephone Encounter (Signed)
Pt called c/o right breast tenderness, I spoke with patient and advised her OV with Dr.Fontaine best. Pt will schedule.

## 2015-03-24 NOTE — Telephone Encounter (Signed)
-----   Message from Anastasio Auerbach, MD sent at 03/24/2015 12:54 PM EDT ----- Scheduled right diagnostic mammography and ultrasound reference prior right breast cancer now with right breast pain. No palpable abnormalities by physician exam.

## 2015-03-24 NOTE — Patient Instructions (Signed)
Follow up for mammogram and ultrasound as scheduled 

## 2015-03-24 NOTE — Telephone Encounter (Signed)
Orders placed at breast center, they will contact pt to schedule.

## 2015-03-25 NOTE — Telephone Encounter (Signed)
The breast center called stating for the order there will need to be a location, I explained that TF didn't put a specific location. I called the patient to check with her and she was fully aware of the above because she tried to schedule and was unable to do this. Patient said the location is at the 3 o clock position. I called breast center to relay information and they will contact pt to schedule.

## 2015-03-31 ENCOUNTER — Encounter: Payer: Self-pay | Admitting: Internal Medicine

## 2015-03-31 ENCOUNTER — Ambulatory Visit (INDEPENDENT_AMBULATORY_CARE_PROVIDER_SITE_OTHER): Payer: Medicare Other | Admitting: Internal Medicine

## 2015-03-31 VITALS — BP 100/70 | HR 88 | Ht 66.5 in | Wt 145.1 lb

## 2015-03-31 DIAGNOSIS — K219 Gastro-esophageal reflux disease without esophagitis: Secondary | ICD-10-CM

## 2015-03-31 DIAGNOSIS — R1013 Epigastric pain: Secondary | ICD-10-CM | POA: Diagnosis not present

## 2015-03-31 MED ORDER — RANITIDINE HCL 300 MG PO TABS: 300.0000 mg | ORAL_TABLET | Freq: Every day | ORAL | Status: DC

## 2015-03-31 NOTE — Telephone Encounter (Signed)
Appointment on 04/01/15

## 2015-03-31 NOTE — Progress Notes (Signed)
UNDINE NEALIS Jun 13, 1946 [id number]  Note: This dictation was prepared with Dragon digital system. Any transcriptional errors that result from this procedure are unintentional.   History of Present Illness: This is a 69 year old white female with irritable bowel syndrome and gastroesophageal reflux. Last appointment November 2015 for heartburn and dyspepsia. She was started on Nexium 40 mg twice a day which she subsequently reduced to one a day and most recently over-the-counter Nexium 20 mg daily. She denies any symptoms of reflux. Gastric emptying scan in August 2015 was normal. Upper abdominal ultrasound in November 2014 was normal. Upper endoscopy in January 2013 showed mild gastritis. H. pylori negative. She is concerned about long-term side effects of PPIs She is up-to-date on colonoscopy. Last exam annually 2013 showed hyperplastic polyp. She has a positive family history of colon cancer in a parent.Colonoscopy every 5 years.    Past Medical History  Diagnosis Date  . GERD (gastroesophageal reflux disease)   . IBS (irritable bowel syndrome)   . Hyperplastic colon polyp   . Osteoporosis 2014    T score -3.0 left forearm  . Liver cyst     Benign  . Rosacea   . Tinnitus   . History of breast cancer     2006  S/P RIGHT LUMPECTOMY AND RADIATION FOLLOWED BY DR RUBIN--  NO RECURRENCE  . Internal hemorrhoid   . OA (osteoarthritis)   . History of kidney stones   . Renal calculus, left     non obstructive  . Idiopathic peripheral neuropathy     bilateral tingling  . Leiomyoma     Multiple small  . Diverticulosis   . Kidney stone   . Bursitis     right hip    Past Surgical History  Procedure Laterality Date  . Supranumerary nipple excision  1973    CYST  . Extracorporeal shock wave lithotripsy      X2  . Tonsillectomy  AGE 15  . Breast lumpectomy  2006     Right -- S/P RADIATION--  NO RECURRENCE  . Hysteroscopy w/d&c  08/03/2012    Procedure: DILATATION AND CURETTAGE  /HYSTEROSCOPY;  Surgeon: Anastasio Auerbach, MD;  Location: [facility];  Service: Gynecology;  Laterality: N/A;  . Dilation and curettage of uterus    . Hysteroscopy      Allergies  Allergen Reactions  . Amoxicillin Diarrhea    GI upset    Family history and social history have been reviewed.  Review of Systems: Denies dysphagia. Heartburn. Diarrhea or constipation  The remainder of the 10 point ROS is negative except as outlined in the H&P  Physical Exam: General Appearance Well developed, in no distress Psychological Normal mood and affect  Assessment and Plan:   69 year old white female with gastroesophageal reflux and irritable bowel syndrome currently under good control. Because of long-term side effects of PPIs we will switch to ranitidine 300 mg daily. She will let us know if it works.  Positive family history of colon cancer in a parent. Recall colonoscopy in January 2018    [name] 03/31/2015

## 2015-03-31 NOTE — Patient Instructions (Addendum)
Stop taking your Nexium.   We have sent the following medications to your pharmacy for you to pick up at your convenience:ranitidine.   Dr Sherren Mocha

## 2015-04-01 ENCOUNTER — Ambulatory Visit
Admission: RE | Admit: 2015-04-01 | Discharge: 2015-04-01 | Disposition: A | Discharge status: Home / Self Care | Payer: Medicare Other | Source: Ambulatory Visit | Attending: Gynecology | Admitting: Gynecology

## 2015-04-01 DIAGNOSIS — N644 Mastodynia: Secondary | ICD-10-CM

## 2015-05-12 ENCOUNTER — Telehealth: Payer: Self-pay | Admitting: Internal Medicine

## 2015-05-13 NOTE — Telephone Encounter (Signed)
Patient states she was switched from Nexium to Ranitidine 300 mg in May. She is having left side pain and not sleeping from GERD. She is asking if she can switch back to Nexium. Please, advise.

## 2015-05-13 NOTE — Telephone Encounter (Signed)
Left a message for patient to call back. 

## 2015-05-13 NOTE — Telephone Encounter (Signed)
OK to switch to Nexiem 40 mg 1 po qd #90, 3 refills

## 2015-05-14 MED ORDER — ESOMEPRAZOLE MAGNESIUM 40 MG PO CPDR: 40.0000 mg | DELAYED_RELEASE_CAPSULE | Freq: Every day | ORAL | Status: DC

## 2015-05-14 NOTE — Telephone Encounter (Signed)
Rx sent to pharmacy. Left a message for patient to call back.

## 2015-05-14 NOTE — Telephone Encounter (Signed)
Patient notified of recommendations. 

## 2015-06-29 ENCOUNTER — Other Ambulatory Visit: Payer: Self-pay

## 2015-06-29 DIAGNOSIS — Z1231 Encounter for screening mammogram for malignant neoplasm of breast: Secondary | ICD-10-CM

## 2015-08-14 ENCOUNTER — Ambulatory Visit (INDEPENDENT_AMBULATORY_CARE_PROVIDER_SITE_OTHER): Payer: Medicare Other | Admitting: Gynecology

## 2015-08-14 ENCOUNTER — Encounter: Payer: Self-pay | Admitting: Gynecology

## 2015-08-14 ENCOUNTER — Other Ambulatory Visit (HOSPITAL_COMMUNITY)
Admission: RE | Admit: 2015-08-14 | Discharge: 2015-08-14 | Disposition: A | Discharge status: Home / Self Care | Payer: Medicare Other | Source: Ambulatory Visit | Attending: Gynecology | Admitting: Gynecology

## 2015-08-14 VITALS — BP 120/66 | Ht 66.5 in | Wt 144.0 lb

## 2015-08-14 DIAGNOSIS — N952 Postmenopausal atrophic vaginitis: Secondary | ICD-10-CM

## 2015-08-14 DIAGNOSIS — Z124 Encounter for screening for malignant neoplasm of cervix: Secondary | ICD-10-CM | POA: Insufficient documentation

## 2015-08-14 DIAGNOSIS — Z01419 Encounter for gynecological examination (general) (routine) without abnormal findings: Secondary | ICD-10-CM

## 2015-08-14 DIAGNOSIS — M810 Age-related osteoporosis without current pathological fracture: Secondary | ICD-10-CM | POA: Diagnosis not present

## 2015-08-14 NOTE — Patient Instructions (Signed)
Schedule your bone density. Let me know if you have any issues doing so.  Continue with your Prolia injections every 6 months  You may obtain a copy of any labs that were done today by logging onto MyChart as outlined in the instructions provided with your AVS (after visit summary). The office will not call with normal lab results but certainly if there are any significant abnormalities then we will contact you.   Health Maintenance Adopting a healthy lifestyle and getting preventive care can go a long way to promote health and wellness. Talk with your health care provider about what schedule of regular examinations is right for you. This is a good chance for you to check in with your provider about disease prevention and staying healthy. In between checkups, there are plenty of things you can do on your own. Experts have done a lot of research about which lifestyle changes and preventive measures are most likely to keep you healthy. Ask your health care provider for more information. WEIGHT AND DIET  Eat a healthy diet  Be sure to include plenty of vegetables, fruits, low-fat dairy products, and lean protein.  Do not eat a lot of foods high in solid fats, added sugars, or salt.  Get regular exercise. This is one of the most important things you can do for your health.  Most adults should exercise for at least 150 minutes each week. The exercise should increase your heart rate and make you sweat (moderate-intensity exercise).  Most adults should also do strengthening exercises at least twice a week. This is in addition to the moderate-intensity exercise.  Maintain a healthy weight  Body mass index (BMI) is a measurement that can be used to identify possible weight problems. It estimates body fat based on height and weight. Your health care provider can help determine your BMI and help you achieve or maintain a healthy weight.  For females 67 years of age and older:   A BMI below 18.5 is  considered underweight.  A BMI of 18.5 to 24.9 is normal.  A BMI of 25 to 29.9 is considered overweight.  A BMI of 30 and above is considered obese.  Watch levels of cholesterol and blood lipids  You should start having your blood tested for lipids and cholesterol at 71 years of age, then have this test every 5 years.  You may need to have your cholesterol levels checked more often if:  Your lipid or cholesterol levels are high.  You are older than 71 years of age.  You are at high risk for heart disease.  CANCER SCREENING   Lung Cancer  Lung cancer screening is recommended for adults 47-25 years old who are at high risk for lung cancer because of a history of smoking.  A yearly low-dose CT scan of the lungs is recommended for people who:  Currently smoke.  Have quit within the past 15 years.  Have at least a 30-pack-year history of smoking. A pack year is smoking an average of one pack of cigarettes a day for 1 year.  Yearly screening should continue until it has been 15 years since you quit.  Yearly screening should stop if you develop a health problem that would prevent you from having lung cancer treatment.  Breast Cancer  Practice breast self-awareness. This means understanding how your breasts normally appear and feel.  It also means doing regular breast self-exams. Let your health care provider know about any changes, no matter how small.  If you are in your 20s or 30s, you should have a clinical breast exam (CBE) by a health care provider every 1-3 years as part of a regular health exam.  If you are 90 or older, have a CBE every year. Also consider having a breast X-ray (mammogram) every year.  If you have a family history of breast cancer, talk to your health care provider about genetic screening.  If you are at high risk for breast cancer, talk to your health care provider about having an MRI and a mammogram every year.  Breast cancer gene (BRCA)  assessment is recommended for women who have family members with BRCA-related cancers. BRCA-related cancers include:  Breast.  Ovarian.  Tubal.  Peritoneal cancers.  Results of the assessment will determine the need for genetic counseling and BRCA1 and BRCA2 testing. Cervical Cancer Routine pelvic examinations to screen for cervical cancer are no longer recommended for nonpregnant women who are considered low risk for cancer of the pelvic organs (ovaries, uterus, and vagina) and who do not have symptoms. A pelvic examination may be necessary if you have symptoms including those associated with pelvic infections. Ask your health care provider if a screening pelvic exam is right for you.   The Pap test is the screening test for cervical cancer for women who are considered at risk.  If you had a hysterectomy for a problem that was not cancer or a condition that could lead to cancer, then you no longer need Pap tests.  If you are older than 65 years, and you have had normal Pap tests for the past 10 years, you no longer need to have Pap tests.  If you have had past treatment for cervical cancer or a condition that could lead to cancer, you need Pap tests and screening for cancer for at least 20 years after your treatment.  If you no longer get a Pap test, assess your risk factors if they change (such as having a new sexual partner). This can affect whether you should start being screened again.  Some women have medical problems that increase their chance of getting cervical cancer. If this is the case for you, your health care provider may recommend more frequent screening and Pap tests.  The human papillomavirus (HPV) test is another test that may be used for cervical cancer screening. The HPV test looks for the virus that can cause cell changes in the cervix. The cells collected during the Pap test can be tested for HPV.  The HPV test can be used to screen women 42 years of age and older.  Getting tested for HPV can extend the interval between normal Pap tests from three to five years.  An HPV test also should be used to screen women of any age who have unclear Pap test results.  After 71 years of age, women should have HPV testing as often as Pap tests.  Colorectal Cancer  This type of cancer can be detected and often prevented.  Routine colorectal cancer screening usually begins at 71 years of age and continues through 71 years of age.  Your health care provider may recommend screening at an earlier age if you have risk factors for colon cancer.  Your health care provider may also recommend using home test kits to check for hidden blood in the stool.  A small camera at the end of a tube can be used to examine your colon directly (sigmoidoscopy or colonoscopy). This is done to check for  the earliest forms of colorectal cancer.  Routine screening usually begins at age 71.  Direct examination of the colon should be repeated every 5-10 years through 71 years of age. However, you may need to be screened more often if early forms of precancerous polyps or small growths are found. Skin Cancer  Check your skin from head to toe regularly.  Tell your health care provider about any new moles or changes in moles, especially if there is a change in a mole's shape or color.  Also tell your health care provider if you have a mole that is larger than the size of a pencil eraser.  Always use sunscreen. Apply sunscreen liberally and repeatedly throughout the day.  Protect yourself by wearing long sleeves, pants, a wide-brimmed hat, and sunglasses whenever you are outside. HEART DISEASE, DIABETES, AND HIGH BLOOD PRESSURE   Have your blood pressure checked at least every 1-2 years. High blood pressure causes heart disease and increases the risk of stroke.  If you are between 84 years and 71 years old, ask your health care provider if you should take aspirin to prevent  strokes.  Have regular diabetes screenings. This involves taking a blood sample to check your fasting blood sugar level.  If you are at a normal weight and have a low risk for diabetes, have this test once every three years after 71 years of age.  If you are overweight and have a high risk for diabetes, consider being tested at a younger age or more often. PREVENTING INFECTION  Hepatitis B  If you have a higher risk for hepatitis B, you should be screened for this virus. You are considered at high risk for hepatitis B if:  You were born in a country where hepatitis B is common. Ask your health care provider which countries are considered high risk.  Your parents were born in a high-risk country, and you have not been immunized against hepatitis B (hepatitis B vaccine).  You have HIV or AIDS.  You use needles to inject street drugs.  You live with someone who has hepatitis B.  You have had sex with someone who has hepatitis B.  You get hemodialysis treatment.  You take certain medicines for conditions, including cancer, organ transplantation, and autoimmune conditions. Hepatitis C  Blood testing is recommended for:  Everyone born from [birth year] through 1967.  Anyone with known risk factors for hepatitis C. Sexually transmitted infections (STIs)  You should be screened for sexually transmitted infections (STIs) including gonorrhea and chlamydia if:  You are sexually active and are younger than 71 years of age.  You are older than 71 years of age and your health care provider tells you that you are at risk for this type of infection.  Your sexual activity has changed since you were last screened and you are at an increased risk for chlamydia or gonorrhea. Ask your health care provider if you are at risk.  If you do not have HIV, but are at risk, it may be recommended that you take a prescription medicine daily to prevent HIV infection. This is called pre-exposure prophylaxis  (PrEP). You are considered at risk if:  You are sexually active and do not regularly use condoms or know the HIV status of your partner(s).  You take drugs by injection.  You are sexually active with a partner who has HIV. Talk with your health care provider about whether you are at high risk of being infected with HIV. If you  choose to begin PrEP, you should first be tested for HIV. You should then be tested every 3 months for as long as you are taking PrEP.  PREGNANCY   If you are premenopausal and you may become pregnant, ask your health care provider about preconception counseling.  If you may become pregnant, take 400 to 800 micrograms (mcg) of folic acid every day.  If you want to prevent pregnancy, talk to your health care provider about birth control (contraception). OSTEOPOROSIS AND MENOPAUSE   Osteoporosis is a disease in which the bones lose minerals and strength with aging. This can result in serious bone fractures. Your risk for osteoporosis can be identified using a bone density scan.  If you are 31 years of age or older, or if you are at risk for osteoporosis and fractures, ask your health care provider if you should be screened.  Ask your health care provider whether you should take a calcium or vitamin D supplement to lower your risk for osteoporosis.  Menopause may have certain physical symptoms and risks.  Hormone replacement therapy may reduce some of these symptoms and risks. Talk to your health care provider about whether hormone replacement therapy is right for you.  HOME CARE INSTRUCTIONS   Schedule regular health, dental, and eye exams.  Stay current with your immunizations.   Do not use any tobacco products including cigarettes, chewing tobacco, or electronic cigarettes.  If you are pregnant, do not drink alcohol.  If you are breastfeeding, limit how much and how often you drink alcohol.  Limit alcohol intake to no more than 1 drink per day for  nonpregnant women. One drink equals 12 ounces of beer, 5 ounces of wine, or 1 ounces of hard liquor.  Do not use street drugs.  Do not share needles.  Ask your health care provider for help if you need support or information about quitting drugs.  Tell your health care provider if you often feel depressed.  Tell your health care provider if you have ever been abused or do not feel safe at home. Document Released: 05/30/2011 Document Revised: 03/31/2014 Document Reviewed: 10/16/2013 [facility] Patient Information 2015 [place], [place]. This information is not intended to replace advice given to you by your health care provider. Make sure you discuss any questions you have with your health care provider.

## 2015-08-14 NOTE — Addendum Note (Signed)
Addended by: Nelva Nay on: 08/14/2015 03:07 PM   Modules accepted: Orders

## 2015-08-14 NOTE — Progress Notes (Signed)
Dana Chambers 1946/05/01 [id number]        68 y.o.  G1P1001 for breast and pelvic exam.  Past medical history,surgical history, problem list, medications, allergies, family history and social history were all reviewed and documented as reviewed in the EPIC chart.  ROS:  Performed with pertinent positives and negatives included in the history, assessment and plan.   Additional significant findings :  none   Exam: Kim Counsellor Vitals:   08/14/15 1420  BP: 120/66  Height: 5' 6.5" (1.689 m)  Weight: 144 lb (65.318 kg)   General appearance:  Normal affect, orientation and appearance. Skin: Grossly normal HEENT: Without gross lesions.  No cervical or supraclavicular adenopathy. Thyroid normal.  Lungs:  Clear without wheezing, rales or rhonchi Cardiac: RR, without RMG Abdominal:  Soft, nontender, without masses, guarding, rebound, organomegaly or hernia Breasts:  Examined lying and sitting. Left without masses, retractions, discharge or axillary adenopathy.  Right status post lumpectomy and nipple resection with well-healed scars. No masses or axillary adenopathy. Pelvic:  Ext/BUS/vagina with atrophic changes.  Cervix with atrophic changes. Pap smear done  Uterus axial to anteverted, normal size, shape and contour, midline and mobile nontender   Adnexa  Without masses or tenderness    Anus and perineum  Normal   Rectovaginal  Normal sphincter tone without palpated masses or tenderness.    Assessment/Plan:  69 y.o. G13P1001 female for breast and pelvic exam.  1. Postmenopausal/atrophic genital changes. Patient without significant hot flushes, night sweats, vaginal dryness or any vaginal bleeding. Continue to monitor and report any vaginal bleeding. 2. Osteoporosis. DEXA 2014 T score -3 left forearm. Had been on bisphosphonates for approximately 10 years with several year drug-free holiday.  Initiated Prolia for approximately 2 years. We'll continue on this and repeat DEXA now she  is going to call to arrange. We'll plan on approximately five-year course assuming she has a good response and then a drug-free holiday. 3. History of right breast cancer. Episode of right mastalgia earlier this year with negative studies and negative exam. This has resolved. Today's exam NED.  Has follow up mammogram scheduled this coming month. SBE monthly reviewed. 4. Pap smear 2013. Pap smear done today.  No history of significant abnormal Pap smears. Options to stop screening issues over the age of 73 review but she is uncomfortable with this. 5. Colonoscopy 2013. Repeat at their recommended interval. 6. Health maintenance. No routine blood work done as she has this done at her primary physician's office. Follow up 1 year, sooner as needed   Anastasio Auerbach MD, 2:53 PM 08/14/2015

## 2015-08-15 LAB — URINALYSIS W MICROSCOPIC + REFLEX CULTURE
Bacteria, UA: NONE SEEN [HPF]
Bilirubin Urine: NEGATIVE
Casts: NONE SEEN [LPF]
Crystals: NONE SEEN [HPF]
Glucose, UA: NEGATIVE
Hgb urine dipstick: NEGATIVE
Ketones, ur: NEGATIVE
Nitrite: NEGATIVE
Protein, ur: NEGATIVE
Specific Gravity, Urine: 1.017 (ref 1.001–1.035)
Squamous Epithelial / LPF: NONE SEEN [HPF] (ref <=5)
Yeast: NONE SEEN [HPF]
pH: 5.5 (ref 5.0–8.0)

## 2015-08-17 ENCOUNTER — Other Ambulatory Visit: Payer: Self-pay | Admitting: Gynecology

## 2015-08-17 MED ORDER — SULFAMETHOXAZOLE-TRIMETHOPRIM 800-160 MG PO TABS: 1.0000 | ORAL_TABLET | Freq: Two times a day (BID) | ORAL | Status: DC

## 2015-08-18 ENCOUNTER — Telehealth: Payer: Self-pay | Admitting: Gynecology

## 2015-08-18 LAB — URINE CULTURE: Colony Count: 75000

## 2015-08-18 NOTE — Telephone Encounter (Signed)
Prolia due after 08/30/15 . Calcium 9.0 on 10/03/15. Insurance benefit No deductible, No PA, co-pay $50, with or without OV $20 administration fee, approx cost to pt. $70. Left VM for patient to call.

## 2015-08-19 LAB — CYTOLOGY - PAP

## 2015-08-20 ENCOUNTER — Encounter: Payer: Self-pay | Admitting: Gynecology

## 2015-08-21 ENCOUNTER — Other Ambulatory Visit: Payer: Self-pay | Admitting: *Deleted

## 2015-08-21 MED ORDER — CIPROFLOXACIN HCL 250 MG PO TABS: 250.0000 mg | ORAL_TABLET | Freq: Two times a day (BID) | ORAL | Status: DC

## 2015-08-28 ENCOUNTER — Telehealth: Payer: Self-pay | Admitting: *Deleted

## 2015-08-28 DIAGNOSIS — N390 Urinary tract infection, site not specified: Secondary | ICD-10-CM

## 2015-08-28 NOTE — Telephone Encounter (Signed)
Pt was treated for UTI asked if repeat U/A needed, I told Dr.Fontaine did not mention this, pt would like to come, order placed.

## 2015-09-02 ENCOUNTER — Ambulatory Visit
Admission: RE | Admit: 2015-09-02 | Discharge: 2015-09-02 | Disposition: A | Discharge status: Home / Self Care | Payer: Medicare Other | Source: Ambulatory Visit

## 2015-09-02 DIAGNOSIS — Z1231 Encounter for screening mammogram for malignant neoplasm of breast: Secondary | ICD-10-CM

## 2015-09-03 ENCOUNTER — Other Ambulatory Visit: Payer: Self-pay | Admitting: Gynecology

## 2015-09-03 DIAGNOSIS — R928 Other abnormal and inconclusive findings on diagnostic imaging of breast: Secondary | ICD-10-CM

## 2015-09-04 NOTE — Telephone Encounter (Signed)
LM for pt to call to schedule Prolia injection KW CMA

## 2015-09-08 NOTE — Telephone Encounter (Signed)
Pt is waiting on information regarding previous Prolia injection and billing questions. Sharrie Rothman will inform me if pt decides to schedule appointment.

## 2015-09-09 NOTE — Telephone Encounter (Signed)
I feel the patient can proceed with next Prolia injection since benefits are $50 copay for medication and $20 copay for administration. Since last injection with OV had to send notes for claim to be paid. I left message for patient. KW CMA

## 2015-09-09 NOTE — Telephone Encounter (Signed)
Pt made apt for 09/14/15 930am KW CMA

## 2015-09-11 ENCOUNTER — Other Ambulatory Visit: Payer: Self-pay | Admitting: Gynecology

## 2015-09-11 ENCOUNTER — Ambulatory Visit
Admission: RE | Admit: 2015-09-11 | Discharge: 2015-09-11 | Disposition: A | Discharge status: Home / Self Care | Payer: Medicare Other | Source: Ambulatory Visit | Attending: Gynecology | Admitting: Gynecology

## 2015-09-11 DIAGNOSIS — R928 Other abnormal and inconclusive findings on diagnostic imaging of breast: Secondary | ICD-10-CM

## 2015-09-14 ENCOUNTER — Telehealth: Payer: Self-pay | Admitting: *Deleted

## 2015-09-14 ENCOUNTER — Ambulatory Visit (INDEPENDENT_AMBULATORY_CARE_PROVIDER_SITE_OTHER): Payer: Medicare Other | Admitting: Gynecology

## 2015-09-14 ENCOUNTER — Other Ambulatory Visit: Payer: Medicare Other

## 2015-09-14 DIAGNOSIS — M810 Age-related osteoporosis without current pathological fracture: Secondary | ICD-10-CM | POA: Diagnosis not present

## 2015-09-14 DIAGNOSIS — N390 Urinary tract infection, site not specified: Secondary | ICD-10-CM

## 2015-09-14 MED ORDER — DENOSUMAB 60 MG/ML ~~LOC~~ SOLN
60.0000 mg | Freq: Once | SUBCUTANEOUS | Status: AC
  Administered 2015-09-14: 60 mg via SUBCUTANEOUS

## 2015-09-14 NOTE — Telephone Encounter (Signed)
Pt called requesting order faxed to [facility] for dexa, order faxed.

## 2015-09-15 ENCOUNTER — Telehealth: Payer: Self-pay | Admitting: Gynecology

## 2015-09-15 LAB — URINALYSIS W MICROSCOPIC + REFLEX CULTURE
Bacteria, UA: NONE SEEN [HPF]
Bilirubin Urine: NEGATIVE
Casts: NONE SEEN [LPF]
Crystals: NONE SEEN [HPF]
Glucose, UA: NEGATIVE
Hgb urine dipstick: NEGATIVE
Ketones, ur: NEGATIVE
Leukocytes, UA: NEGATIVE
Nitrite: NEGATIVE
Protein, ur: NEGATIVE
RBC / HPF: NONE SEEN RBC/HPF (ref <=2)
Specific Gravity, Urine: 1.018 (ref 1.001–1.035)
Squamous Epithelial / LPF: NONE SEEN [HPF] (ref <=5)
WBC, UA: NONE SEEN WBC/HPF (ref <=5)
Yeast: NONE SEEN [HPF]
pH: 5.5 (ref 5.0–8.0)

## 2015-09-15 NOTE — Telephone Encounter (Signed)
Pt received Prolia injection on 09/14/2015 next injection due after 03/15/2016

## 2015-09-22 ENCOUNTER — Other Ambulatory Visit: Payer: Self-pay | Admitting: Gynecology

## 2015-09-22 DIAGNOSIS — R928 Other abnormal and inconclusive findings on diagnostic imaging of breast: Secondary | ICD-10-CM

## 2015-09-23 ENCOUNTER — Ambulatory Visit
Admission: RE | Admit: 2015-09-23 | Discharge: 2015-09-23 | Disposition: A | Discharge status: Home / Self Care | Payer: Medicare Other | Source: Ambulatory Visit | Attending: Gynecology | Admitting: Gynecology

## 2015-09-23 DIAGNOSIS — R928 Other abnormal and inconclusive findings on diagnostic imaging of breast: Secondary | ICD-10-CM

## 2015-09-29 DIAGNOSIS — M810 Age-related osteoporosis without current pathological fracture: Secondary | ICD-10-CM

## 2015-09-29 HISTORY — DX: Age-related osteoporosis without current pathological fracture: M81.0

## 2015-10-15 ENCOUNTER — Encounter: Payer: Self-pay | Admitting: Gynecology

## 2015-10-21 ENCOUNTER — Encounter: Payer: Self-pay | Admitting: Gynecology

## 2015-10-21 ENCOUNTER — Telehealth: Payer: Self-pay

## 2015-10-21 NOTE — Telephone Encounter (Signed)
Should not affect the actual measurement of calcium in the bones. But call [facility] let them know this.

## 2015-10-21 NOTE — Telephone Encounter (Signed)
Patient said she was going to call. She said upon review of the result paper they sent her Dana Chambers has her weight wrong. She said they have her at 172.2 pounds. She said she the day she was there the lady weighed her and she weighed 147. She asked if that could impact her result since they do figure BMI.

## 2015-10-21 NOTE — Telephone Encounter (Signed)
-----   Message from Anastasio Auerbach, MD sent at 10/21/2015  9:22 AM EST ----- Tell patient that her bone density does appear to have gotten worse but Teola Bradley has changed machines several times in the past several years which is making comparison difficult. I think given her total picture I'm not ready to change therapy but would recommend to stay on the Prolia and then repeat a bone density next year at a 1 year interval as a short follow up to see if things are stable or whether we need to talk about changing therapy.

## 2015-10-21 NOTE — Telephone Encounter (Signed)
Left detailed message for patient at home phone vm.  I spoke with head MA at [facility] and she took the message and will relay to BD tech. Will only call back if it impacts result.

## 2015-11-12 ENCOUNTER — Other Ambulatory Visit (INDEPENDENT_AMBULATORY_CARE_PROVIDER_SITE_OTHER): Payer: Medicare Other

## 2015-11-12 DIAGNOSIS — Z0000 Encounter for general adult medical examination without abnormal findings: Secondary | ICD-10-CM

## 2015-11-12 LAB — POCT URINALYSIS DIPSTICK
Bilirubin, UA: NEGATIVE
Blood, UA: NEGATIVE
Glucose, UA: NEGATIVE
Ketones, UA: NEGATIVE
Leukocytes, UA: NEGATIVE
Nitrite, UA: NEGATIVE
Protein, UA: NEGATIVE
Spec Grav, UA: 1.015
Urobilinogen, UA: 0.2
pH, UA: 7

## 2015-11-12 LAB — TSH: TSH: 1.25 u[IU]/mL (ref 0.35–4.50)

## 2015-11-12 LAB — LIPID PANEL
Cholesterol: 265 mg/dL — ABNORMAL HIGH (ref 0–200)
HDL: 78.5 mg/dL (ref >39.00)
LDL Cholesterol: 173 mg/dL — ABNORMAL HIGH (ref 0–99)
NonHDL: 186.87
Total CHOL/HDL Ratio: 3
Triglycerides: 69 mg/dL (ref 0.0–149.0)
VLDL: 13.8 mg/dL (ref 0.0–40.0)

## 2015-11-12 LAB — CBC WITH DIFFERENTIAL/PLATELET
Basophils Absolute: 0 10*3/uL (ref 0.0–0.1)
Basophils Relative: 0.4 % (ref 0.0–3.0)
Eosinophils Absolute: 0.1 10*3/uL (ref 0.0–0.7)
Eosinophils Relative: 1.3 % (ref 0.0–5.0)
HCT: 41.2 % (ref 36.0–46.0)
Hemoglobin: 13.6 g/dL (ref 12.0–15.0)
Lymphocytes Relative: 38 % (ref 12.0–46.0)
Lymphs Abs: 2.6 10*3/uL (ref 0.7–4.0)
MCHC: 33.1 g/dL (ref 30.0–36.0)
MCV: 90.5 fl (ref 78.0–100.0)
Monocytes Absolute: 0.5 10*3/uL (ref 0.1–1.0)
Monocytes Relative: 8.1 % (ref 3.0–12.0)
Neutro Abs: 3.6 10*3/uL (ref 1.4–7.7)
Neutrophils Relative %: 52.2 % (ref 43.0–77.0)
Platelets: 314 10*3/uL (ref 150.0–400.0)
RBC: 4.55 Mil/uL (ref 3.87–5.11)
RDW: 13.7 % (ref 11.5–15.5)
WBC: 6.8 10*3/uL (ref 4.0–10.5)

## 2015-11-12 LAB — BASIC METABOLIC PANEL
BUN: 17 mg/dL (ref 6–23)
CO2: 34 mEq/L — ABNORMAL HIGH (ref 19–32)
Calcium: 9.3 mg/dL (ref 8.4–10.5)
Chloride: 103 mEq/L (ref 96–112)
Creatinine, Ser: 0.83 mg/dL (ref 0.40–1.20)
GFR: 72 mL/min (ref >60.00)
Glucose, Bld: 92 mg/dL (ref 70–99)
Potassium: 5.1 mEq/L (ref 3.5–5.1)
Sodium: 141 mEq/L (ref 135–145)

## 2015-11-12 LAB — HEPATIC FUNCTION PANEL
ALT: 16 U/L (ref 0–35)
AST: 23 U/L (ref 0–37)
Albumin: 4.4 g/dL (ref 3.5–5.2)
Alkaline Phosphatase: 60 U/L (ref 39–117)
Bilirubin, Direct: 0.1 mg/dL (ref 0.0–0.3)
Total Bilirubin: 0.5 mg/dL (ref 0.2–1.2)
Total Protein: 7.2 g/dL (ref 6.0–8.3)

## 2015-11-16 ENCOUNTER — Other Ambulatory Visit: Payer: Medicare Other

## 2015-11-17 ENCOUNTER — Encounter: Payer: Self-pay | Admitting: Family Medicine

## 2015-11-17 ENCOUNTER — Ambulatory Visit (INDEPENDENT_AMBULATORY_CARE_PROVIDER_SITE_OTHER): Payer: Medicare Other | Admitting: Family Medicine

## 2015-11-17 VITALS — BP 120/80 | Temp 98.2°F | Ht 66.5 in | Wt 146.0 lb

## 2015-11-17 DIAGNOSIS — N390 Urinary tract infection, site not specified: Secondary | ICD-10-CM

## 2015-11-17 DIAGNOSIS — N952 Postmenopausal atrophic vaginitis: Secondary | ICD-10-CM | POA: Diagnosis not present

## 2015-11-17 DIAGNOSIS — K219 Gastro-esophageal reflux disease without esophagitis: Secondary | ICD-10-CM | POA: Diagnosis not present

## 2015-11-17 DIAGNOSIS — Z0000 Encounter for general adult medical examination without abnormal findings: Secondary | ICD-10-CM

## 2015-11-17 DIAGNOSIS — D0591 Unspecified type of carcinoma in situ of right breast: Secondary | ICD-10-CM | POA: Diagnosis not present

## 2015-11-17 MED ORDER — ESOMEPRAZOLE MAGNESIUM 40 MG PO CPDR: 40.0000 mg | DELAYED_RELEASE_CAPSULE | Freq: Every day | ORAL | Status: DC

## 2015-11-17 NOTE — Progress Notes (Signed)
   Subjective:    Patient ID: Dana Chambers, female    DOB: December 24, 1945, 69 y.o.   MRN: [id number]  HPI Dana Chambers is a 69 year old married female nonsmoker who comes in today for general physical examination  She's had a history of right-sided breast cancer. She underwent surgery and postop radiation. Her mammogram this fall showed a suspicious lesion on the left breast. Biopsy fortunately was negative.  She sees Dr. Lana Fish for GYN care. He has her on an injectable medication for osteoporosis.  She saw Dr. Maurene Capes for reflux esophagitis. She is on Nexium 40 mg daily  She has a stye in her right eye for which she's taken doxycycline 100 mg twice a day at the direction of her ophthalmologist  She gets routine eye care, dental care, and you mammography, colonoscopy 2013 normal  Vaccinations are up-to-date  Cognitive function normal she walks on a regular basis home health safety reviewed no issues identified, no guns in the house, she does have a healthcare power of attorney and living well    Review of Systems  Constitutional: Negative.   HENT: Negative.   Eyes: Negative.   Respiratory: Negative.   Cardiovascular: Negative.   Gastrointestinal: Negative.   Endocrine: Negative.   Genitourinary: Negative.   Musculoskeletal: Negative.   Skin: Negative.   Allergic/Immunologic: Negative.   Neurological: Negative.   Hematological: Negative.   Psychiatric/Behavioral: Negative.        Objective:   Physical Exam  Constitutional: She is oriented to person, place, and time. She appears well-developed and well-nourished.  HENT:  Head: Normocephalic and atraumatic.  Right Ear: External ear normal.  Left Ear: External ear normal.  Nose: Nose normal.  Mouth/Throat: Oropharynx is clear and moist.  Eyes: EOM are normal. Pupils are equal, round, and reactive to light.  Neck: Normal range of motion. Neck supple. No JVD present. No tracheal deviation present. No thyromegaly present.    Cardiovascular: Normal rate, regular rhythm, normal heart sounds and intact distal pulses.  Exam reveals no gallop and no friction rub.   No murmur heard. Pulmonary/Chest: Effort normal and breath sounds normal. No stridor. No respiratory distress. She has no wheezes. She has no rales. She exhibits no tenderness.  Abdominal: Soft. Bowel sounds are normal. She exhibits no distension and no mass. There is no tenderness. There is no rebound and no guarding.  Genitourinary:  Bilateral breast exam shows absence of the right nipple. She also has tattoos from previous radiation therapy. No palpable masses right breast reconstructed  Musculoskeletal: Normal range of motion. She exhibits no edema or tenderness.  Lymphadenopathy:    She has no cervical adenopathy.  Neurological: She is alert and oriented to person, place, and time. She has normal reflexes. No cranial nerve deficit. She exhibits normal muscle tone. Coordination normal.  Skin: Skin is warm and dry. No rash noted. No erythema. No pallor.  Total body skin exam normal  Psychiatric: She has a normal mood and affect. Her behavior is normal. Judgment and thought content normal.  Nursing note and vitals reviewed.         Assessment & Plan:Healthy female  Osteoporosis,,,,,  Healthy female  Osteoporosis,,,,,,,, continue injectable medication followed by Dr. Abbie Sons  Status post right breast cancer,,,,,,,, continue surveillance  Reflux esophagitis,,,,,,, continue Nexium

## 2015-11-17 NOTE — Patient Instructions (Signed)
Continue current medications  Remember weightbearing exercise 30 minutes daily  Sunscreens SPF 30+  Follow-up in one year sooner if any problems,,,,,,,,, when you call in August for your physical examination in December,,,,,,, Tommi Rumps or [facility] new adult nurse practitioners or Dr. [place]

## 2015-11-17 NOTE — Progress Notes (Signed)
Pre visit review using our clinic review tool, if applicable. No additional management support is needed unless otherwise documented below in the visit note. 

## 2015-11-24 ENCOUNTER — Encounter: Payer: Medicare Other | Admitting: Family Medicine

## 2015-11-24 ENCOUNTER — Encounter: Payer: Self-pay | Admitting: Gynecology

## 2016-02-16 ENCOUNTER — Other Ambulatory Visit: Payer: Self-pay | Admitting: Gynecology

## 2016-02-16 DIAGNOSIS — N6489 Other specified disorders of breast: Secondary | ICD-10-CM

## 2016-02-17 ENCOUNTER — Encounter: Payer: Self-pay | Admitting: Gynecology

## 2016-02-24 ENCOUNTER — Telehealth: Payer: Self-pay | Admitting: Gynecology

## 2016-02-24 NOTE — Telephone Encounter (Addendum)
Prolia due after 03/15/16. Calcium 9.3 11/12/2015. Prolia checking benefits.PC to pt today informed her.  M81.0

## 2016-03-01 NOTE — Telephone Encounter (Signed)
Insurance no deductible, Co pay with  OV $40, Administration fee $40. ( no OV).  OOPM $4000 ($40 met) Left Vm to call and schedule appointment after 03/15/16

## 2016-03-03 ENCOUNTER — Encounter: Payer: Self-pay | Admitting: Gynecology

## 2016-03-03 NOTE — Telephone Encounter (Signed)
So Co pay $40 with nurse visit.   Prolia 03/16/16 at 10 am   Next injection due after 09/16/16

## 2016-03-04 ENCOUNTER — Ambulatory Visit
Admission: RE | Admit: 2016-03-04 | Discharge: 2016-03-04 | Disposition: A | Discharge status: Home / Self Care | Payer: Medicare Other | Source: Ambulatory Visit | Attending: Gynecology | Admitting: Gynecology

## 2016-03-04 DIAGNOSIS — N6489 Other specified disorders of breast: Secondary | ICD-10-CM

## 2016-03-16 ENCOUNTER — Ambulatory Visit (INDEPENDENT_AMBULATORY_CARE_PROVIDER_SITE_OTHER): Payer: Medicare Other | Admitting: Gynecology

## 2016-03-16 DIAGNOSIS — M810 Age-related osteoporosis without current pathological fracture: Secondary | ICD-10-CM

## 2016-03-16 MED ORDER — DENOSUMAB 60 MG/ML ~~LOC~~ SOLN
60.0000 mg | Freq: Once | SUBCUTANEOUS | Status: AC
  Administered 2016-03-16: 60 mg via SUBCUTANEOUS

## 2016-05-20 ENCOUNTER — Ambulatory Visit (INDEPENDENT_AMBULATORY_CARE_PROVIDER_SITE_OTHER): Payer: Medicare Other

## 2016-05-20 VITALS — BP 124/80 | Ht 67.0 in | Wt 137.4 lb

## 2016-05-20 DIAGNOSIS — Z0000 Encounter for general adult medical examination without abnormal findings: Secondary | ICD-10-CM

## 2016-05-20 NOTE — Patient Instructions (Addendum)
Dana Chambers , Thank you for taking time to come for your Medicare Wellness Visit. I appreciate your ongoing commitment to your health goals. Please review the following plan we discussed and let me know if I can assist you in the future.   These are the goals we discussed: Goals    . Exercise 150 minutes per week (moderate activity)     Continue with physical exercise;  Could use another day of gym; will walk more but will explore other       Dana Chambers will fup with GI regarding colonoscopy due 2018  Will need Hep C drawn next blood draw   Tinnitus; [employer];   Important Safety Information ZOSTAVAX does not protect everyone, so some people who get the vaccine may still get Shingles.  You should not get ZOSTAVAX if you are allergic to any of its ingredients, including gelatin or neomycin, have a weakened immune system, take high doses of steroids, or are pregnant or plan to become pregnant. You should not get ZOSTAVAX to prevent chickenpox.  Talk to your health care professional if you plan to get ZOSTAVAX at the same time as PNEUMOVAX23 (Pneumococcal Vaccine Polyvalent) because it may be better to get these vaccines at least 4 weeks apart.  Possible side effects include redness, pain, itching, swelling, hard lump, warmth, or bruising at the injection site, as well as headache.  ZOSTAVAX contains a weakened chickenpox virus. Tell your health care professional if you will be in close contact with newborn infants, someone who may be pregnant and has not had chickenpox or been vaccinated against chickenpox, or someone who has problems with their immune system. Your health care professional can tell you what situations you may need to avoid. You are encouraged to report negative side effects of prescription drugs to the FDA. Visit [employer], or call 1-800-FDA-1088.  May discuss having a titer drawn with Dr. Sherren Mocha at her next annual visit Go to the [facility].gov online and  review for Shingles    This is a list of the screening recommended for you and due dates:  Health Maintenance  Topic Date Due  .  Hepatitis C: One time screening is recommended by Center for Disease Control  (CDC) for  adults born from [birth year] through 1967.   [birth year]  . Shingles Vaccine  09/18/2004  . Flu Shot  06/28/2016  . Mammogram  09/01/2017  . Colon Cancer Screening  12/13/2021  . Tetanus Vaccine  03/06/2022  . DEXA scan (bone density measurement)  Completed  . Pneumonia vaccines  Completed     Fall Prevention in the Home  Falls can cause injuries. They can happen to people of all ages. There are many things you can do to make your home safe and to help prevent falls.  WHAT CAN I DO ON THE OUTSIDE OF MY HOME?  Regularly fix the edges of walkways and driveways and fix any cracks.  Remove anything that might make you trip as you walk through a door, such as a raised step or threshold.  Trim any bushes or trees on the path to your home.  Use bright outdoor lighting.  Clear any walking paths of anything that might make someone trip, such as rocks or tools.  Regularly check to see if handrails are loose or broken. Make sure that both sides of any steps have handrails.  Any raised decks and porches should have guardrails on the edges.  Have any leaves, snow, or ice cleared regularly.  Use sand or salt on walking paths during winter.  Clean up any spills in your garage right away. This includes oil or grease spills. WHAT CAN I DO IN THE BATHROOM?   Use night lights.  Install grab bars by the toilet and in the tub and shower. Do not use towel bars as grab bars.  Use non-skid mats or decals in the tub or shower.  If you need to sit down in the shower, use a plastic, non-slip stool.  Keep the floor dry. Clean up any water that spills on the floor as soon as it happens.  Remove soap buildup in the tub or shower regularly.  Attach bath mats securely with double-sided  non-slip rug tape.  Do not have throw rugs and other things on the floor that can make you trip. WHAT CAN I DO IN THE BEDROOM?  Use night lights.  Make sure that you have a light by your bed that is easy to reach.  Do not use any sheets or blankets that are too big for your bed. They should not hang down onto the floor.  Have a firm chair that has side arms. You can use this for support while you get dressed.  Do not have throw rugs and other things on the floor that can make you trip. WHAT CAN I DO IN THE KITCHEN?  Clean up any spills right away.  Avoid walking on wet floors.  Keep items that you use a lot in easy-to-reach places.  If you need to reach something above you, use a strong step stool that has a grab bar.  Keep electrical cords out of the way.  Do not use floor polish or wax that makes floors slippery. If you must use wax, use non-skid floor wax.  Do not have throw rugs and other things on the floor that can make you trip. WHAT CAN I DO WITH MY STAIRS?  Do not leave any items on the stairs.  Make sure that there are handrails on both sides of the stairs and use them. Fix handrails that are broken or loose. Make sure that handrails are as long as the stairways.  Check any carpeting to make sure that it is firmly attached to the stairs. Fix any carpet that is loose or worn.  Avoid having throw rugs at the top or bottom of the stairs. If you do have throw rugs, attach them to the floor with carpet tape.  Make sure that you have a light switch at the top of the stairs and the bottom of the stairs. If you do not have them, ask someone to add them for you. WHAT ELSE CAN I DO TO HELP PREVENT FALLS?  Wear shoes that:  Do not have high heels.  Have rubber bottoms.  Are comfortable and fit you well.  Are closed at the toe. Do not wear sandals.  If you use a stepladder:  Make sure that it is fully opened. Do not climb a closed stepladder.  Make sure that both  sides of the stepladder are locked into place.  Ask someone to hold it for you, if possible.  Clearly mark and make sure that you can see:  Any grab bars or handrails.  First and last steps.  Where the edge of each step is.  Use tools that help you move around (mobility aids) if they are needed. These include:  Canes.  Walkers.  Scooters.  Crutches.  Turn on the lights when you  go into a dark area. Replace any light bulbs as soon as they burn out.  Set up your furniture so you have a clear path. Avoid moving your furniture around.  If any of your floors are uneven, fix them.  If there are any pets around you, be aware of where they are.  Review your medicines with your doctor. Some medicines can make you feel dizzy. This can increase your chance of falling. Ask your doctor what other things that you can do to help prevent falls.   This information is not intended to replace advice given to you by your health care provider. Make sure you discuss any questions you have with your health care provider.   Document Released: 09/10/2009 Document Revised: 03/31/2015 Document Reviewed: 12/19/2014 Elsevier Interactive Patient Education 2016 [employer].  Health Maintenance, Female Adopting a healthy lifestyle and getting preventive care can go a long way to promote health and wellness. Talk with your health care provider about what schedule of regular examinations is right for you. This is a good chance for you to check in with your provider about disease prevention and staying healthy. In between checkups, there are plenty of things you can do on your own. Experts have done a lot of research about which lifestyle changes and preventive measures are most likely to keep you healthy. Ask your health care provider for more information. WEIGHT AND DIET  Eat a healthy diet  Be sure to include plenty of vegetables, fruits, low-fat dairy products, and lean protein.  Do not eat a lot of  foods high in solid fats, added sugars, or salt.  Get regular exercise. This is one of the most important things you can do for your health.  Most adults should exercise for at least 150 minutes each week. The exercise should increase your heart rate and make you sweat (moderate-intensity exercise).  Most adults should also do strengthening exercises at least twice a week. This is in addition to the moderate-intensity exercise.  Maintain a healthy weight  Body mass index (BMI) is a measurement that can be used to identify possible weight problems. It estimates body fat based on height and weight. Your health care provider can help determine your BMI and help you achieve or maintain a healthy weight.  For females 76 years of age and older:   A BMI below 18.5 is considered underweight.  A BMI of 18.5 to 24.9 is normal.  A BMI of 25 to 29.9 is considered overweight.  A BMI of 30 and above is considered obese.  Watch levels of cholesterol and blood lipids  You should start having your blood tested for lipids and cholesterol at 72 years of age, then have this test every 5 years.  You may need to have your cholesterol levels checked more often if:  Your lipid or cholesterol levels are high.  You are older than 72 years of age.  You are at high risk for heart disease.  CANCER SCREENING   Lung Cancer  Lung cancer screening is recommended for adults 42-39 years old who are at high risk for lung cancer because of a history of smoking.  A yearly low-dose CT scan of the lungs is recommended for people who:  Currently smoke.  Have quit within the past 15 years.  Have at least a 30-pack-year history of smoking. A pack year is smoking an average of one pack of cigarettes a day for 1 year.  Yearly screening should continue until it  has been 15 years since you quit.  Yearly screening should stop if you develop a health problem that would prevent you from having lung cancer  treatment.  Breast Cancer  Practice breast self-awareness. This means understanding how your breasts normally appear and feel.  It also means doing regular breast self-exams. Let your health care provider know about any changes, no matter how small.  If you are in your 20s or 30s, you should have a clinical breast exam (CBE) by a health care provider every 1-3 years as part of a regular health exam.  If you are 32 or older, have a CBE every year. Also consider having a breast X-ray (mammogram) every year.  If you have a family history of breast cancer, talk to your health care provider about genetic screening.  If you are at high risk for breast cancer, talk to your health care provider about having an MRI and a mammogram every year.  Breast cancer gene (BRCA) assessment is recommended for women who have family members with BRCA-related cancers. BRCA-related cancers include:  Breast.  Ovarian.  Tubal.  Peritoneal cancers.  Results of the assessment will determine the need for genetic counseling and BRCA1 and BRCA2 testing. Cervical Cancer Your health care provider may recommend that you be screened regularly for cancer of the pelvic organs (ovaries, uterus, and vagina). This screening involves a pelvic examination, including checking for microscopic changes to the surface of your cervix (Pap test). You may be encouraged to have this screening done every 3 years, beginning at age 75.  For women ages 60-65, health care providers may recommend pelvic exams and Pap testing every 3 years, or they may recommend the Pap and pelvic exam, combined with testing for human papilloma virus (HPV), every 5 years. Some types of HPV increase your risk of cervical cancer. Testing for HPV may also be done on women of any age with unclear Pap test results.  Other health care providers may not recommend any screening for nonpregnant women who are considered low risk for pelvic cancer and who do not have  symptoms. Ask your health care provider if a screening pelvic exam is right for you.  If you have had past treatment for cervical cancer or a condition that could lead to cancer, you need Pap tests and screening for cancer for at least 20 years after your treatment. If Pap tests have been discontinued, your risk factors (such as having a new sexual partner) need to be reassessed to determine if screening should resume. Some women have medical problems that increase the chance of getting cervical cancer. In these cases, your health care provider may recommend more frequent screening and Pap tests. Colorectal Cancer  This type of cancer can be detected and often prevented.  Routine colorectal cancer screening usually begins at 71 years of age and continues through 72 years of age.  Your health care provider may recommend screening at an earlier age if you have risk factors for colon cancer.  Your health care provider may also recommend using home test kits to check for hidden blood in the stool.  A small camera at the end of a tube can be used to examine your colon directly (sigmoidoscopy or colonoscopy). This is done to check for the earliest forms of colorectal cancer.  Routine screening usually begins at age 62.  Direct examination of the colon should be repeated every 5-10 years through 72 years of age. However, you may need to be screened more  often if early forms of precancerous polyps or small growths are found. Skin Cancer  Check your skin from head to toe regularly.  Tell your health care provider about any new moles or changes in moles, especially if there is a change in a mole's shape or color.  Also tell your health care provider if you have a mole that is larger than the size of a pencil eraser.  Always use sunscreen. Apply sunscreen liberally and repeatedly throughout the day.  Protect yourself by wearing long sleeves, pants, a wide-brimmed hat, and sunglasses whenever you are  outside. HEART DISEASE, DIABETES, AND HIGH BLOOD PRESSURE   High blood pressure causes heart disease and increases the risk of stroke. High blood pressure is more likely to develop in:  People who have blood pressure in the high end of the normal range (130-139/85-89 mm Hg).  People who are overweight or obese.  People who are African American.  If you are 71-32 years of age, have your blood pressure checked every 3-5 years. If you are 74 years of age or older, have your blood pressure checked every year. You should have your blood pressure measured twice--once when you are at a hospital or clinic, and once when you are not at a hospital or clinic. Record the average of the two measurements. To check your blood pressure when you are not at a hospital or clinic, you can use:  An automated blood pressure machine at a pharmacy.  A home blood pressure monitor.  If you are between 58 years and 75 years old, ask your health care provider if you should take aspirin to prevent strokes.  Have regular diabetes screenings. This involves taking a blood sample to check your fasting blood sugar level.  If you are at a normal weight and have a low risk for diabetes, have this test once every three years after 72 years of age.  If you are overweight and have a high risk for diabetes, consider being tested at a younger age or more often. PREVENTING INFECTION  Hepatitis B  If you have a higher risk for hepatitis B, you should be screened for this virus. You are considered at high risk for hepatitis B if:  You were born in a country where hepatitis B is common. Ask your health care provider which countries are considered high risk.  Your parents were born in a high-risk country, and you have not been immunized against hepatitis B (hepatitis B vaccine).  You have HIV or AIDS.  You use needles to inject street drugs.  You live with someone who has hepatitis B.  You have had sex with someone who has  hepatitis B.  You get hemodialysis treatment.  You take certain medicines for conditions, including cancer, organ transplantation, and autoimmune conditions. Hepatitis C  Blood testing is recommended for:  Everyone born from [birth year] through 1967.  Anyone with known risk factors for hepatitis C. Sexually transmitted infections (STIs)  You should be screened for sexually transmitted infections (STIs) including gonorrhea and chlamydia if:  You are sexually active and are younger than 72 years of age.  You are older than 72 years of age and your health care provider tells you that you are at risk for this type of infection.  Your sexual activity has changed since you were last screened and you are at an increased risk for chlamydia or gonorrhea. Ask your health care provider if you are at risk.  If you do not have  HIV, but are at risk, it may be recommended that you take a prescription medicine daily to prevent HIV infection. This is called pre-exposure prophylaxis (PrEP). You are considered at risk if:  You are sexually active and do not regularly use condoms or know the HIV status of your partner(s).  You take drugs by injection.  You are sexually active with a partner who has HIV. Talk with your health care provider about whether you are at high risk of being infected with HIV. If you choose to begin PrEP, you should first be tested for HIV. You should then be tested every 3 months for as long as you are taking PrEP.  PREGNANCY   If you are premenopausal and you may become pregnant, ask your health care provider about preconception counseling.  If you may become pregnant, take 400 to 800 micrograms (mcg) of folic acid every day.  If you want to prevent pregnancy, talk to your health care provider about birth control (contraception). OSTEOPOROSIS AND MENOPAUSE   Osteoporosis is a disease in which the bones lose minerals and strength with aging. This can result in serious bone  fractures. Your risk for osteoporosis can be identified using a bone density scan.  If you are 34 years of age or older, or if you are at risk for osteoporosis and fractures, ask your health care provider if you should be screened.  Ask your health care provider whether you should take a calcium or vitamin D supplement to lower your risk for osteoporosis.  Menopause may have certain physical symptoms and risks.  Hormone replacement therapy may reduce some of these symptoms and risks. Talk to your health care provider about whether hormone replacement therapy is right for you.  HOME CARE INSTRUCTIONS   Schedule regular health, dental, and eye exams.  Stay current with your immunizations.   Do not use any tobacco products including cigarettes, chewing tobacco, or electronic cigarettes.  If you are pregnant, do not drink alcohol.  If you are breastfeeding, limit how much and how often you drink alcohol.  Limit alcohol intake to no more than 1 drink per day for nonpregnant women. One drink equals 12 ounces of beer, 5 ounces of wine, or 1 ounces of hard liquor.  Do not use street drugs.  Do not share needles.  Ask your health care provider for help if you need support or information about quitting drugs.  Tell your health care provider if you often feel depressed.  Tell your health care provider if you have ever been abused or do not feel safe at home.   This information is not intended to replace advice given to you by your health care provider. Make sure you discuss any questions you have with your health care provider.   Document Released: 05/30/2011 Document Revised: 12/05/2014 Document Reviewed: 10/16/2013 Elsevier Interactive Patient Education [employer].

## 2016-05-20 NOTE — Progress Notes (Signed)
Subjective:   Dana Chambers is a 70 y.o. female who presents for Medicare Annual (Subsequent) preventive examination.  Review of Systems:   HRA assessment completed during this visit with Dana Chambers  The Patient was informed  the wellness visit is to identify their future health risk and educate and initiate measures that can reduce risk for increased disease through the lifespan.    NO ROS; Medicare Wellness Visit Last OV:  10/2015 Labs completed: 10/2015 ( chol 265; Trig 69; HDL 78; LDL 173; ratio 3; 92 Educated regarding numbers; ratio 3; has cut back on deserts;  Lifestyle review and risk:  Dana Chambers had dementia (bipolar); depression an osteoporosis; father had stroke and MI;  Medical issues  Breast cancer 2006; Osteoporosis under treatment  Peripheral neuropathy  Psychosocial: Married long term to spouse;  Has one child; Dana Chambers lives in [place]; grandchild 11yo  Tobacco: never smoked   How many drinks do you have per week? 1 glass of wine   Medications; limited meds;  BMI: 23  Diet; 21.6 Try not to eat as many carbs; eat mores salads, more veg Less beef and more fish; monitors portion size; Avoid fast food; cooks at home  Lost 10lbs since Christmas after spouse has surgery Spouse had ankle surgery; he was upstairs and needed assistance Presented opportunity for both to consider weight loss  Teeth or Denture issues? Apt scheduled; sees dentist 2 times per year   Exercise; water walking x 45 minutes;  Using weight when doing so; T and TH;   HOME SAFETY; 2 levels; no downstairs bedrooms  Considering options; now has shower and never uses tub Fall hx; no  Fall risk: Fear of falling? Try to be cautious due to possible osteopenia vs osteoporosis  Given education on "Fall Prevention in the Home" for more safety tips the patient can apply as appropriate.  Long term goal is to "age in place" or undecided and considering options  Safety features reviewed  for safe community; (having trees fall due to weather this year)  Keep firearms in a safe place if they exist; smoke alarms; sun protection when outside;  driving difficulties or accidents; no;   Stressors (1-5) 2 to 3; spouse's surgery;   Mental Health:  Any emotional problems? Anxious, depressed, irritable, sad or blue? no Denies feeling depressed or hopeless; voices pleasure in daily life no How many social activities have you been engaged in within the last 2 weeks? Very social and very engaged in church   Pain: feet; some lack of sensation in toes; tings sometimes; neuropathy   Cognitive; any memory issues;  Recall of names  Manages checkbook, medications; no failures of task Ad8 score reviewed for issues;  Issues making decisions; no  Less interest in hobbies / activities" no  Repeats questions, stories; family complaining: NO  Trouble using ordinary gadgets; microwave; computer: no  Forgets the month or year: no  Mismanaging finances: no  Missing apt: no but does write them down  Daily problems with thinking of memory NO Ad8 score is 0   Mobilization and Functional losses from last year to this year? No/not as physically strong;  Tries be careful with back with carrying laundry; energy levels are lower;   Sleep pattern changes; have gerds; ibs?  Wakes up in the middle of the night;   Urinary or fecal incontinence reviewed: no  ASSESSMENT:  Counseling Health Maintenance Gaps: Hepatitis C; educated;  Colonoscopy; 11/2011; Dr Olevia Perches states 2018/ will call GI  to be sure they noted change as this is not epic EKG: 09/2014  Mammogram: 08/2015 / left breast 02/2016 - neg Dexa/ 09/2015 -1.8 (Chambers) and is on prolia/ followed Dana Chambers has new machine; now undetermined as how much osteoporosis  Wil check again in 1 year   Does have kidney stone; not moved; watch calcium due to this; hx calcium based stones;  Is out in the sun and weight bearing in the pool  using weights in the water  PAP: educated regarding the need for GYN exam; 07/2015 Dr. 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 Maurene Capes GI Dr. Phineas Real GYN;    Education provided and lifestyle risk discussed   All Health Maintenance Gaps Reviewed for closure    Cardiac Risk Factors include: advanced age (>45men, >48 women);dyslipidemia     Objective:     Vitals: BP 124/80 mmHg  Ht 5\' 7"  (1.702 m)  Wt 137 lb 7 oz (62.341 kg)  BMI 21.52 kg/m2  Body mass index is 21.52 kg/(m^2).   Tobacco History  Smoking status  . Never Smoker   Smokeless tobacco  . Never Used     Counseling given: Yes   Past Medical History  Diagnosis Date  . GERD (gastroesophageal reflux disease)   . IBS (irritable bowel syndrome)   . Hyperplastic colon polyp   . Osteoporosis 09/2015    T score -2.7  . Liver cyst     Benign  . Rosacea   . Tinnitus   . History of breast cancer     2006  S/P RIGHT LUMPECTOMY AND RADIATION FOLLOWED BY DR RUBIN--  NO RECURRENCE  . Internal hemorrhoid   . OA (osteoarthritis)   . History of kidney stones   . Renal calculus, left     non obstructive  . Idiopathic peripheral neuropathy (HCC)     bilateral tingling  . Leiomyoma     Multiple small  . Diverticulosis   . Kidney stone   . Bursitis     right hip  . Cancer [facility])     Breast   Past Surgical History  Procedure Laterality Date  . Supranumerary nipple excision   1973    CYST  . Extracorporeal shock wave lithotripsy      X2  . Tonsillectomy  AGE 26  . Breast lumpectomy  2006     Right -- S/P RADIATION--  NO RECURRENCE  . Hysteroscopy w/d&c  08/03/2012    Procedure: DILATATION AND CURETTAGE /HYSTEROSCOPY;  Surgeon: Anastasio Auerbach, MD;  Location: [facility];  Service: Gynecology;  Laterality: N/A;  . Dilation and curettage of uterus    . Hysteroscopy     Family History  Problem Relation Age of Onset  . Dementia Dana Chambers   . Depression Dana Chambers   . Osteoporosis Dana Chambers   . Stroke Father   . Heart attack Father 43  . Colon cancer Neg Hx   . Rectal cancer Maternal Grandmother   .  Stroke Sister     TIA   History  Sexual Activity  . Sexual Activity: Yes  . Birth Control/ Protection: Post-menopausal    Comment: 1st intercourse 70 yo-Fewer than 5 partners    Outpatient Encounter Prescriptions as of 05/20/2016  Medication Sig  . denosumab (PROLIA) 60 MG/ML SOLN injection Inject 60 mg into the skin every 6 (six) months. Administer in upper arm, thigh, or abdomen  . doxycycline (VIBRAMYCIN) 100 MG capsule Take 1 capsule by mouth 2 (two) times daily.  Marland Kitchen esomeprazole (NEXIUM) 40 MG capsule Take 1 capsule (40 mg total) by mouth daily at 12 noon.  Marland Kitchen ibuprofen (ADVIL,MOTRIN) 600 MG tablet Take 600 mg by mouth as needed.   . Multiple Vitamin (MULTIVITAMIN) tablet Take 1 tablet by mouth daily.    No facility-administered encounter medications on file as of 05/20/2016.    Activities of Daily Living In your present state of health, do you have any difficulty performing the following activities: 05/20/2016  Hearing? (No Data)  Vision? N  Difficulty concentrating or making decisions? N  Walking or climbing stairs? N  Dressing or bathing? N  Doing errands, shopping? N  Preparing Food and eating ? N  Using the Toilet? N  In the past six months, have you accidently leaked urine? N  Do you have problems with loss of bowel control? N    Managing your Medications? N  Managing your Finances? N  Housekeeping or managing your Housekeeping? N    Patient Care Team: Dorena Cookey, MD as PCP - General    Assessment:     Exercise Activities and Dietary recommendations Current Exercise Habits: Home exercise routine, Time (Minutes): 45, Frequency (Times/Week): 3, Weekly Exercise (Minutes/Week): 135, Intensity: Moderate  Goals    . Exercise 150 minutes per week (moderate activity)     Continue with physical exercise;  Could use another day of gym; will walk more but will explore other      Fall Risk Fall Risk  05/20/2016 05/20/2016 11/17/2015 10/09/2014 09/02/2013  Falls in the past year? No No No No No   Depression Screen PHQ 2/9 Scores 05/20/2016 05/20/2016 11/17/2015 10/09/2014  PHQ - 2 Score 0 0 0 0     Cognitive Testing MMSE - Mini Mental State Exam 05/20/2016  Not completed: (No Data)    Ad8 score 0 ; Orient x 3; mood stable and affect appropriate  Immunization History  Administered Date(s) Administered  . Influenza Whole 10/29/2009, 08/11/2010, 08/19/2011  . Influenza,inj,Quad PF,36+ Mos 08/21/2013, 09/02/2014  . Influenza-Unspecified 09/09/2015  . Pneumococcal Conjugate-13 09/02/2014  . Pneumococcal Polysaccharide-23 03/06/2012  . Tdap 03/06/2012   Screening Tests Health Maintenance  Topic Date Due  . Hepatitis C Screening  Jan 03, 1946  . ZOSTAVAX  09/18/2004  . INFLUENZA VACCINE  06/28/2016  . MAMMOGRAM  09/01/2017  . COLONOSCOPY  12/13/2021  . TETANUS/TDAP  03/06/2022  . DEXA SCAN  Completed  . PNA vac Low Risk Adult  Completed      Plan:   Fup regarding colonoscopy; Feels it should be 5 years or 2018 Will need hep c drawn at next blood draw. Given infor on the American Tinnitus asso Given information the the Zostavax/ not sure she wants to take   During the course of the visit the patient was educated and counseled about the following appropriate screening and preventive services:    Vaccines to include Pneumoccal, Influenza, Hepatitis B, Td, Zostavax, HCV  Electrocardiogram  Cardiovascular Disease  Colorectal cancer screening  Bone density screening  Diabetes screening  Glaucoma screening  Mammography/PAP  Nutrition counseling   Patient Instructions (the written plan) was given to the patient.   Wynetta Fines, RN  05/20/2016

## 2016-05-20 NOTE — Progress Notes (Signed)
I have reviewed this note and agree with the plan

## 2016-05-23 ENCOUNTER — Telehealth: Payer: Self-pay

## 2016-05-23 NOTE — Telephone Encounter (Signed)
Call to GI to fup on colonoscopy as the patient stated Dr. Olevia Perches told her 5 years and not 2023; Per GI today,  a doctor will review her case in Jan 2018 and then make a decision as to call her in for colonoscopy or to post pone until 2023;   Call to Ms. Soberanis to let her know to expect an outreach in Jan 2018 and otherwise talk to Dr. Sherren Mocha.  Discussed with GI and they do not change the date in epic until the doctor reviews her case in 2018

## 2016-06-22 ENCOUNTER — Encounter: Payer: Self-pay | Admitting: Family Medicine

## 2016-08-04 ENCOUNTER — Other Ambulatory Visit: Payer: Self-pay | Admitting: Gynecology

## 2016-08-04 DIAGNOSIS — Z1231 Encounter for screening mammogram for malignant neoplasm of breast: Secondary | ICD-10-CM

## 2016-08-19 ENCOUNTER — Ambulatory Visit (INDEPENDENT_AMBULATORY_CARE_PROVIDER_SITE_OTHER): Payer: Medicare Other | Admitting: Gynecology

## 2016-08-19 ENCOUNTER — Telehealth: Payer: Self-pay | Admitting: *Deleted

## 2016-08-19 ENCOUNTER — Encounter: Payer: Self-pay | Admitting: Gynecology

## 2016-08-19 VITALS — BP 118/78 | Ht 67.0 in | Wt 136.0 lb

## 2016-08-19 DIAGNOSIS — M810 Age-related osteoporosis without current pathological fracture: Secondary | ICD-10-CM | POA: Diagnosis not present

## 2016-08-19 DIAGNOSIS — N952 Postmenopausal atrophic vaginitis: Secondary | ICD-10-CM

## 2016-08-19 DIAGNOSIS — Z01419 Encounter for gynecological examination (general) (routine) without abnormal findings: Secondary | ICD-10-CM | POA: Diagnosis not present

## 2016-08-19 NOTE — Telephone Encounter (Signed)
-----   Message from Anastasio Auerbach, MD sent at 08/19/2016 10:38 AM EDT ----- Call [facility] and order a DEXA. I would like to see a short interval study from her last DEXA last year as they changed machines and she currently is on Prolia more trying to decide whether to continue this or not. The one year apart studies using the same machine will help [place] make this decision.

## 2016-08-19 NOTE — Telephone Encounter (Signed)
Appointment on 10/12/16 @ 1:00pm at [place], since dexa was last done in Nov. 2016 if filed with insurance it may note be covered per [place]. Self pay cost would be $45 for dexa. Order will be faxed.   I had to leave a message for pt to call and relay this information to her.

## 2016-08-19 NOTE — Patient Instructions (Signed)
Office will call to arrange for the bone density. Call my office if you do not hear from them within 2 weeks

## 2016-08-19 NOTE — Progress Notes (Signed)
    Dana Chambers December 20, 1945 [id number]        69 y.o.  G1P1001  for annual exam.  Several issues noted below.  Past medical history,surgical history, problem list, medications, allergies, family history and social history were all reviewed and documented as reviewed in the EPIC chart.  ROS:  Performed with pertinent positives and negatives included in the history, assessment and plan.   Additional significant findings :  None   Exam: [name] assistant Vitals:   08/19/16 1007  BP: 118/78  Weight: 136 lb (61.7 kg)  Height: 5\' 7"  (1.702 m)   Body mass index is 21.3 kg/m.  General appearance:  Normal affect, orientation and appearance. Skin: Grossly normal HEENT: Without gross lesions.  No cervical or supraclavicular adenopathy. Thyroid normal.  Lungs:  Clear without wheezing, rales or rhonchi Cardiac: RR, without RMG Abdominal:  Soft, nontender, without masses, guarding, rebound, organomegaly or hernia Breasts:  Examined lying and sitting. Left without masses, retractions, discharge or axillary adenopathy.  Right status post lumpectomy and nipple resection with well-healed scars. No masses or axillary adenopathy. Pelvic:  Ext, BUS, Vagina with atrophic changes  Cervix with atrophic changes  Uterus anteverted, normal size, shape and contour, midline and mobile nontender   Adnexa without masses or tenderness    Anus and perineum normal   Rectovaginal normal sphincter tone without palpated masses or tenderness.    Assessment/Plan:  70 y.o. G30P1001 female for annual exam.   1. Postmenopausal/atrophic genital changes. Without significant hot flushes, night sweats, vaginal dryness or any vaginal bleeding. Continue monitor report any issues or bleeding. 2. Osteoporosis. DEXA 2016 showed loss at the left femoral neck with improvement in the spine. Has been on Prolia for 3 years. Prior use of bisphosphates for approximately 10 years. Several years of drug-free holiday. Last DEXA  at [facility] where they switched machines and the question as far as validity of this study was discussed. Options for management moving forward include continue Prolia with repeat DEXA next year at 2 year interval or do a short interval study now at 1 year. The patient really would prefer short interval study as she does not want to continue the medication another year if it is not helping. Certainly reasonable given the switch in the machines and will go ahead and arrange this. 3. Right breast cancer. Exam NED. Coming due for 6 month follow up mammogram per radiology recommendation and she is arranging. 4. Pap smear 2016. No Pap smear done today. No history of significant abnormal Pap smears previously. Options to stop screening altogether based on age per current screening guidelines reviewed. Will readdress on annual basis. 5. Colonoscopy 2013. Repeat at their recommended interval. 6. Health maintenance. No routine lab work done as patient reports this done elsewhere. Follow up for DEXA otherwise follow up in one year.  10 minutes of my time in excess of her gynecologic exam was spent in direct face to face counseling and coordination of care in regards to her issues of osteoporosis and treatment decision.    Anastasio Auerbach MD, 10:32 AM 08/19/2016

## 2016-08-22 NOTE — Telephone Encounter (Signed)
Pt informed with the below note. 

## 2016-08-30 ENCOUNTER — Telehealth: Payer: Self-pay | Admitting: Gynecology

## 2016-09-07 ENCOUNTER — Ambulatory Visit: Payer: Medicare Other

## 2016-09-09 ENCOUNTER — Ambulatory Visit
Admission: RE | Admit: 2016-09-09 | Discharge: 2016-09-09 | Disposition: A | Discharge status: Home / Self Care | Payer: Medicare Other | Source: Ambulatory Visit | Attending: Gynecology | Admitting: Gynecology

## 2016-09-09 DIAGNOSIS — Z1231 Encounter for screening mammogram for malignant neoplasm of breast: Secondary | ICD-10-CM

## 2016-09-12 NOTE — Telephone Encounter (Signed)
Phone call to pt regarding Prolia due after 09/17/16. No deductible,  Co pay with  OV $40 , Adminstration fee $40 .  Calcium level 9.3  11/12/15 , Pt states to have repeat  Nov 15th bone density. I will check in Nov after Dr Phineas Real has reviewed her bone density.

## 2016-09-13 ENCOUNTER — Encounter: Payer: Self-pay | Admitting: Gynecology

## 2016-09-29 ENCOUNTER — Ambulatory Visit (INDEPENDENT_AMBULATORY_CARE_PROVIDER_SITE_OTHER): Payer: Medicare Other

## 2016-09-29 ENCOUNTER — Ambulatory Visit: Payer: Medicare Other

## 2016-09-29 DIAGNOSIS — Z23 Encounter for immunization: Secondary | ICD-10-CM | POA: Diagnosis not present

## 2016-10-14 ENCOUNTER — Encounter: Payer: Self-pay | Admitting: Gynecology

## 2016-10-17 ENCOUNTER — Telehealth: Payer: Self-pay | Admitting: Gynecology

## 2016-10-17 NOTE — Telephone Encounter (Signed)
Tell patient bone density shows stability/slight improvement in the hips and stability of the spine. Slight loss at the wrist. At this point I would recommend monitoring and repeating a bone density at some point in the future. We'll talk about this at her annual exam as to when we will do this.

## 2016-10-17 NOTE — Telephone Encounter (Signed)
Left message for pt to call.

## 2016-10-17 NOTE — Telephone Encounter (Signed)
Pt informed with the below note, she asked if you want her to have her prolia shot? Pt said she is due for one. Please advise

## 2016-10-17 NOTE — Telephone Encounter (Signed)
Pt informed,she will call Pam to get prolia set up.

## 2016-10-17 NOTE — Telephone Encounter (Signed)
Yes I would continue the Prolia for now. I may consider doing another DEXA next year as a short interval study

## 2016-10-19 NOTE — Telephone Encounter (Signed)
Bone density report continue Prolia per Basil Dess MD, complete exam 01/2016. Dec 5th at 10 30 am Nurse only call.

## 2016-11-01 ENCOUNTER — Ambulatory Visit (INDEPENDENT_AMBULATORY_CARE_PROVIDER_SITE_OTHER): Payer: Medicare Other | Admitting: Gynecology

## 2016-11-01 DIAGNOSIS — M810 Age-related osteoporosis without current pathological fracture: Secondary | ICD-10-CM | POA: Diagnosis not present

## 2016-11-01 MED ORDER — DENOSUMAB 60 MG/ML ~~LOC~~ SOLN
60.0000 mg | Freq: Once | SUBCUTANEOUS | Status: AC
  Administered 2016-11-01: 60 mg via SUBCUTANEOUS

## 2016-11-01 NOTE — Telephone Encounter (Signed)
prolia given 11/01/16  Next injection after 05/03/17

## 2016-11-29 ENCOUNTER — Other Ambulatory Visit (INDEPENDENT_AMBULATORY_CARE_PROVIDER_SITE_OTHER): Payer: Medicare Other

## 2016-11-29 DIAGNOSIS — Z0000 Encounter for general adult medical examination without abnormal findings: Secondary | ICD-10-CM

## 2016-11-29 LAB — CBC WITH DIFFERENTIAL/PLATELET
Basophils Absolute: 0 10*3/uL (ref 0.0–0.1)
Basophils Relative: 0.5 % (ref 0.0–3.0)
Eosinophils Absolute: 0.1 10*3/uL (ref 0.0–0.7)
Eosinophils Relative: 1.7 % (ref 0.0–5.0)
HCT: 39.8 % (ref 36.0–46.0)
Hemoglobin: 13.7 g/dL (ref 12.0–15.0)
Lymphocytes Relative: 38.1 % (ref 12.0–46.0)
Lymphs Abs: 2.3 10*3/uL (ref 0.7–4.0)
MCHC: 34.4 g/dL (ref 30.0–36.0)
MCV: 89.6 fl (ref 78.0–100.0)
Monocytes Absolute: 0.4 10*3/uL (ref 0.1–1.0)
Monocytes Relative: 7.2 % (ref 3.0–12.0)
Neutro Abs: 3.2 10*3/uL (ref 1.4–7.7)
Neutrophils Relative %: 52.5 % (ref 43.0–77.0)
Platelets: 324 10*3/uL (ref 150.0–400.0)
RBC: 4.45 Mil/uL (ref 3.87–5.11)
RDW: 13.8 % (ref 11.5–15.5)
WBC: 6 10*3/uL (ref 4.0–10.5)

## 2016-11-29 LAB — POC URINALSYSI DIPSTICK (AUTOMATED)
Bilirubin, UA: NEGATIVE
Blood, UA: NEGATIVE
Glucose, UA: NEGATIVE
Ketones, UA: NEGATIVE
Nitrite, UA: NEGATIVE
Protein, UA: NEGATIVE
Spec Grav, UA: 1.015
Urobilinogen, UA: 0.2
pH, UA: 7

## 2016-11-29 LAB — BASIC METABOLIC PANEL
BUN: 21 mg/dL (ref 6–23)
CO2: 32 mEq/L (ref 19–32)
Calcium: 9.2 mg/dL (ref 8.4–10.5)
Chloride: 103 mEq/L (ref 96–112)
Creatinine, Ser: 0.8 mg/dL (ref 0.40–1.20)
GFR: 74.9 mL/min (ref >60.00)
Glucose, Bld: 96 mg/dL (ref 70–99)
Potassium: 4.6 mEq/L (ref 3.5–5.1)
Sodium: 140 mEq/L (ref 135–145)

## 2016-11-29 LAB — HEPATIC FUNCTION PANEL
ALT: 16 U/L (ref 0–35)
AST: 17 U/L (ref 0–37)
Albumin: 4.4 g/dL (ref 3.5–5.2)
Alkaline Phosphatase: 62 U/L (ref 39–117)
Bilirubin, Direct: 0.1 mg/dL (ref 0.0–0.3)
Total Bilirubin: 0.5 mg/dL (ref 0.2–1.2)
Total Protein: 6.6 g/dL (ref 6.0–8.3)

## 2016-11-29 LAB — LIPID PANEL
Cholesterol: 265 mg/dL — ABNORMAL HIGH (ref 0–200)
HDL: 77.5 mg/dL (ref >39.00)
LDL Cholesterol: 171 mg/dL — ABNORMAL HIGH (ref 0–99)
NonHDL: 187.75
Total CHOL/HDL Ratio: 3
Triglycerides: 83 mg/dL (ref 0.0–149.0)
VLDL: 16.6 mg/dL (ref 0.0–40.0)

## 2016-11-29 LAB — TSH: TSH: 1.05 u[IU]/mL (ref 0.35–4.50)

## 2016-12-05 ENCOUNTER — Ambulatory Visit (INDEPENDENT_AMBULATORY_CARE_PROVIDER_SITE_OTHER): Payer: Medicare Other | Admitting: Family Medicine

## 2016-12-05 ENCOUNTER — Encounter: Payer: Self-pay | Admitting: Family Medicine

## 2016-12-05 VITALS — BP 102/70 | HR 51 | Temp 97.4°F | Ht 66.0 in | Wt 139.8 lb

## 2016-12-05 DIAGNOSIS — G609 Hereditary and idiopathic neuropathy, unspecified: Secondary | ICD-10-CM

## 2016-12-05 DIAGNOSIS — K219 Gastro-esophageal reflux disease without esophagitis: Secondary | ICD-10-CM

## 2016-12-05 DIAGNOSIS — H9193 Unspecified hearing loss, bilateral: Secondary | ICD-10-CM

## 2016-12-05 DIAGNOSIS — D0591 Unspecified type of carcinoma in situ of right breast: Secondary | ICD-10-CM | POA: Diagnosis not present

## 2016-12-05 DIAGNOSIS — Z0000 Encounter for general adult medical examination without abnormal findings: Secondary | ICD-10-CM | POA: Diagnosis not present

## 2016-12-05 MED ORDER — ESOMEPRAZOLE MAGNESIUM 40 MG PO CPDR: 40.0000 mg | DELAYED_RELEASE_CAPSULE | Freq: Every day | ORAL | 4 refills | Status: DC

## 2016-12-05 NOTE — Patient Instructions (Signed)
Call [facility] ear nose and throat asked to see Dr. Tonia Ghent. She's a chief audiologist  Continue current medications  Continue daily exercise  Follow-up in one year sooner if any problems

## 2016-12-05 NOTE — Progress Notes (Signed)
Dana Chambers is a 71 year old married female nonsmoker who comes in today for general physical examination because of a history of breast cancer  She had a malignancy removed stopped current radiation. She's done well and sent no complications. She now gets just an annual mammogram.  She takes Nexium 40 mg daily because of a history of GERD.  She is on prolia via her gynecologist Dr. Abbie Sons because of osteoporosis. She takes Motrin 600 mg daily when necessary  She gets routine eye care, dental care, BSE monthly, annual mammography, colonoscopy 2015. She recalls that Dr. Maurene Capes told her she should have more frequent colonoscopies because of your family history. I gave her the number of GI to call.  Vaccinations up-to-date except she's never had a shingles vaccine. Advised her to do that.  Cognitive function normal she walks daily home health safety reviewed no issues identified, no guns in the house, she does have a healthcare power of attorney and living well.  14 point review of systems June otherwise negative  BP 102/70 (BP Location: Left Arm, Patient Position: Sitting, Cuff Size: Normal)   Pulse (!) 51   Temp 97.4 F (36.3 C) (Oral)   Ht 5\' 6"  (1.676 m)   Wt 139 lb 12.8 oz (63.4 kg)   BMI 22.56 kg/m  Examination HEENT were negative neck was supple no adenopathy thyroid normal no carotid bruits cardiopulmonary exam normal breast exam left breast normal right breast shows evidence of previous surgery with removal of the nipple. There tattoos from previous radiation. No palpable masses on either breast. Abdominal exam negative pelvic rectal by Dr. Phineas Real therefore deferred to him. Extremities normal skin normal peripheral pulses normal. She does complain of some tingling in her right and left anterior thighs. Neurologic exam normal  #1 status post right breast cancer,,,,,, continue BSE monthly at home annual mammography  #2 reflux esophagitis....... try to decrease Nexium to Monday  Wednesday Friday to see if her symptoms we'll remain quiet  #3 osteoporosis........ continue injectable medicine follow-up with Dr. Abbie Sons.  #4 hearing loss.......... call [facility] ENT

## 2016-12-20 ENCOUNTER — Encounter: Payer: Self-pay | Admitting: Gastroenterology

## 2016-12-22 ENCOUNTER — Encounter: Payer: Self-pay | Admitting: *Deleted

## 2017-01-10 ENCOUNTER — Ambulatory Visit (AMBULATORY_SURGERY_CENTER): Payer: Self-pay

## 2017-01-10 VITALS — Ht 66.5 in | Wt 141.4 lb

## 2017-01-10 DIAGNOSIS — Z800 Family history of malignant neoplasm of digestive organs: Secondary | ICD-10-CM

## 2017-01-10 DIAGNOSIS — Z86010 Personal history of colonic polyps: Secondary | ICD-10-CM

## 2017-01-10 MED ORDER — NA SULFATE-K SULFATE-MG SULF 17.5-3.13-1.6 GM/177ML PO SOLN: ORAL | 0 refills | Status: DC

## 2017-01-10 NOTE — Progress Notes (Signed)
Per pt, no allergies to soy or egg products.Pt not taking any weight loss meds or using  O2 at home. 

## 2017-01-12 ENCOUNTER — Encounter: Payer: Self-pay | Admitting: Gastroenterology

## 2017-01-24 ENCOUNTER — Encounter: Payer: Medicare Other | Admitting: Gastroenterology

## 2017-01-26 ENCOUNTER — Ambulatory Visit (AMBULATORY_SURGERY_CENTER): Payer: Medicare Other | Admitting: Gastroenterology

## 2017-01-26 ENCOUNTER — Encounter: Payer: Self-pay | Admitting: Gastroenterology

## 2017-01-26 VITALS — BP 115/73 | HR 75 | Temp 97.8°F | Resp 11 | Ht 66.5 in | Wt 141.0 lb

## 2017-01-26 DIAGNOSIS — Z800 Family history of malignant neoplasm of digestive organs: Secondary | ICD-10-CM

## 2017-01-26 DIAGNOSIS — Z1211 Encounter for screening for malignant neoplasm of colon: Secondary | ICD-10-CM | POA: Diagnosis not present

## 2017-01-26 DIAGNOSIS — Z1212 Encounter for screening for malignant neoplasm of rectum: Secondary | ICD-10-CM | POA: Diagnosis not present

## 2017-01-26 DIAGNOSIS — D125 Benign neoplasm of sigmoid colon: Secondary | ICD-10-CM

## 2017-01-26 MED ORDER — SODIUM CHLORIDE 0.9 % IV SOLN: 500.0000 mL | INTRAVENOUS | Status: DC

## 2017-01-26 NOTE — Progress Notes (Signed)
Called to room to assist during endoscopic procedure.  Patient ID and intended procedure confirmed with present staff. Received instructions for my participation in the procedure from the performing physician.  

## 2017-01-26 NOTE — Patient Instructions (Signed)
Handout given: polyps, hemorrhoids.   YOU HAD AN ENDOSCOPIC PROCEDURE TODAY AT [facility]:   Refer to the procedure report that was given to you for any specific questions about what was found during the examination.  If the procedure report does not answer your questions, please call your gastroenterologist to clarify.  If you requested that your care partner not be given the details of your procedure findings, then the procedure report has been included in a sealed envelope for you to review at your convenience later.  YOU SHOULD EXPECT: Some feelings of bloating in the abdomen. Passage of more gas than usual.  Walking can help get rid of the air that was put into your GI tract during the procedure and reduce the bloating. If you had a lower endoscopy (such as a colonoscopy or flexible sigmoidoscopy) you may notice spotting of blood in your stool or on the toilet paper. If you underwent a bowel prep for your procedure, you may not have a normal bowel movement for a few days.  Please Note:  You might notice some irritation and congestion in your nose or some drainage.  This is from the oxygen used during your procedure.  There is no need for concern and it should clear up in a day or so.  SYMPTOMS TO REPORT IMMEDIATELY:   Following lower endoscopy (colonoscopy or flexible sigmoidoscopy):  Excessive amounts of blood in the stool  Significant tenderness or worsening of abdominal pains  Swelling of the abdomen that is new, acute  Fever of 100F or higher   For urgent or emergent issues, a gastroenterologist can be reached at any hour by calling [phone].   DIET:  We do recommend a small meal at first, but then you may proceed to your regular diet.  Drink plenty of fluids but you should avoid alcoholic beverages for 24 hours.  ACTIVITY:  You should plan to take it easy for the rest of today and you should NOT DRIVE or use heavy machinery until tomorrow (because of the  sedation medicines used during the test).    FOLLOW UP: Our staff will call the number listed on your records the next business day following your procedure to check on you and address any questions or concerns that you may have regarding the information given to you following your procedure. If we do not reach you, we will leave a message.  However, if you are feeling well and you are not experiencing any problems, there is no need to return our call.  We will assume that you have returned to your regular daily activities without incident.  If any biopsies were taken you will be contacted by phone or by letter within the next 1-3 weeks.  Please call us at [phone] if you have not heard about the biopsies in 3 weeks.    SIGNATURES/CONFIDENTIALITY: You and/or your care partner have signed paperwork which will be entered into your electronic medical record.  These signatures attest to the fact that that the information above on your After Visit Summary has been reviewed and is understood.  Full responsibility of the confidentiality of this discharge information lies with you and/or your care-partner.

## 2017-01-26 NOTE — Progress Notes (Signed)
  [facility] Post-op Note  Patient: Dana Chambers  Procedure(s) Performed: colonoscopy  Patient Location: [facility] - Recovery Area  Anesthesia Type: Deep Sedation/Propofol  Level of Consciousness: awake, oriented and patient cooperative  Airway and Oxygen Therapy: Patient Spontanous Breathing  Post-op Pain: none  Post-op Assessment:  Post-op Vital signs reviewed, Patient's Cardiovascular Status Stable, Respiratory Function Stable, Patent Airway, No signs of Nausea or vomiting and Pain level controlled  Post-op Vital Signs: Reviewed and stable  Complications: No apparent anesthesia complications  Tashon Capp E 12:39 PM

## 2017-01-26 NOTE — Op Note (Signed)
[place] Patient Name: Dana Chambers Procedure Date: 01/26/2017 12:09 PM MRN: [id number] Endoscopist: Mauri Pole , MD Age: 71 Referring MD:  Date of Birth: 05-15-1946 Gender: Female Account #: [id number] Procedure:                Colonoscopy Indications:              Screening in patient at increased risk: Family                            history of 1st-degree relative with colorectal                            cancer Medicines:                Monitored Anesthesia Care Procedure:                Pre-Anesthesia Assessment:                           - Prior to the procedure, a History and Physical                            was performed, and patient medications and                            allergies were reviewed. The patient's tolerance of                            previous anesthesia was also reviewed. The risks                            and benefits of the procedure and the sedation                            options and risks were discussed with the patient.                            All questions were answered, and informed consent                            was obtained. Prior Anticoagulants: The patient has                            taken no previous anticoagulant or antiplatelet                            agents. ASA Grade Assessment: II - A patient with                            mild systemic disease. After reviewing the risks                            and benefits, the patient was deemed in  satisfactory condition to undergo the procedure.                           After obtaining informed consent, the colonoscope                            was passed under direct vision. Throughout the                            procedure, the patient's blood pressure, pulse, and                            oxygen saturations were monitored continuously. The                            Colonoscope was introduced through the anus and                         advanced to the the cecum, identified by                            appendiceal orifice and ileocecal valve. The                            colonoscopy was performed without difficulty. The                            patient tolerated the procedure well. The quality                            of the bowel preparation was excellent. The                            ileocecal valve, appendiceal orifice, and rectum                            were photographed. Scope In: 12:21:07 PM Scope Out: 12:32:50 PM Scope Withdrawal Time: 0 hours 8 minutes 39 seconds  Total Procedure Duration: 0 hours 11 minutes 43 seconds  Findings:                 The perianal and digital rectal examinations were                            normal.                           A 7 mm polyp was found in the sigmoid colon. The                            polyp was sessile. The polyp was removed with a                            cold snare. Resection and retrieval were complete.  Non-bleeding internal hemorrhoids were found during                            retroflexion. The hemorrhoids were small.                           The exam was otherwise without abnormality. Complications:            No immediate complications. Estimated Blood Loss:     Estimated blood loss was minimal. Impression:               - One 7 mm polyp in the sigmoid colon, removed with                            a cold snare. Resected and retrieved.                           - Non-bleeding internal hemorrhoids.                           - The examination was otherwise normal. Recommendation:           - Patient has a contact number available for                            emergencies. The signs and symptoms of potential                            delayed complications were discussed with the                            patient. Return to normal activities tomorrow.                            Written discharge  instructions were provided to the                            patient.                           - Resume previous diet.                           - Continue present medications.                           - Await pathology results.                           - Repeat colonoscopy in 5 years for surveillance                            based on pathology results.                           - Return to GI clinic PRN. Mauri Pole, MD 01/26/2017 12:36:49 PM This report has been signed electronically.

## 2017-01-27 ENCOUNTER — Telehealth: Payer: Self-pay | Admitting: *Deleted

## 2017-01-27 NOTE — Telephone Encounter (Signed)
  Follow up Call-  Call back number 01/26/2017  Post procedure Call Back phone  # [phone]  Permission to leave phone message Yes  Some recent data might be hidden     Patient questions:  Do you have a fever, pain , or abdominal swelling? No. Pain Score  0 *  Have you tolerated food without any problems? Yes.    Have you been able to return to your normal activities? Yes.    Do you have any questions about your discharge instructions: Diet   No. Medications  No. Follow up visit  No.  Do you have questions or concerns about your Care? No.  Actions: * If pain score is 4 or above: No action needed, pain <4.

## 2017-02-02 ENCOUNTER — Encounter: Payer: Self-pay | Admitting: Gastroenterology

## 2017-03-21 ENCOUNTER — Ambulatory Visit (INDEPENDENT_AMBULATORY_CARE_PROVIDER_SITE_OTHER): Payer: Medicare Other | Admitting: Family Medicine

## 2017-03-21 ENCOUNTER — Encounter: Payer: Self-pay | Admitting: Family Medicine

## 2017-03-21 DIAGNOSIS — R0781 Pleurodynia: Secondary | ICD-10-CM

## 2017-03-21 DIAGNOSIS — M810 Age-related osteoporosis without current pathological fracture: Secondary | ICD-10-CM

## 2017-03-21 DIAGNOSIS — M419 Scoliosis, unspecified: Secondary | ICD-10-CM | POA: Insufficient documentation

## 2017-03-21 DIAGNOSIS — K219 Gastro-esophageal reflux disease without esophagitis: Secondary | ICD-10-CM

## 2017-03-21 NOTE — Assessment & Plan Note (Addendum)
S: right femoral neck -2.6 with bone density 10/14/16. Has had 3-4 prolia injections every 6 months. Prior managed by OB GYN--> now Dr. Phineas Real managing and injecting.   Fosamax- gradually worsening and had reflux per her report(10-15 years) boniva- gradually worsening Tamoxifen- gradually worsening Prolia- for last 12 months at least.  A/P: repeat planned in November. She asks my opinion and I told her the prolia is very reasonable. We can reevaluate after bone density (unfortunately dexa equipment changed in last few years - next one compared to 2017 should be on same equipment- she states this would actually be the 3rd on same equipment  Per avs- Need at least 800 IU vitamin D per day (MV planned). Calcium goal through diet of 1200mg   Due to kidney stones history

## 2017-03-21 NOTE — Progress Notes (Signed)
Phone: [phone]  Subjective:  Patient presents today to establish care with me as their new primary care provider. Patient was formerly a patient of Dr. Sherren Mocha. I see her husband already. Chief complaint-noted.   See problem oriented charting ROS- left sided rib pain without central chest pain, shortness of breath, diaphoresis, dizziness, left arm or neck pain. Does have curvature of spine from scoliosis.   The following were reviewed and entered/updated in epic: Past Medical History:  Diagnosis Date  . Bursitis    right hip  . Cancer [facility]) 2006   Breast/right  . Diverticulosis   . GERD (gastroesophageal reflux disease)   . History of breast cancer/33 radiation treatments/ no chemo    2006  S/P RIGHT LUMPECTOMY AND RADIATION FOLLOWED BY DR RUBIN--  NO RECURRENCE  . History of kidney stones   . Hyperplastic colon polyp   . IBS (irritable bowel syndrome)   . Idiopathic peripheral neuropathy    bilateral tingling/ due to fall in [place]  . Internal hemorrhoid   . Kidney stone   . Leiomyoma    Multiple small  . Liver cyst 2006   Benign  . OA (osteoarthritis)   . Osteoporosis 09/2015   T score -2.7  . Raynaud disease   . Renal calculus, left    non obstructive  . Rib pain on left side    able to lay on left side  . Rosacea   . Tinnitus    Patient Active Problem List   Diagnosis Date Noted  . Rib pain on left side 03/21/2017    Priority: Medium  . Scoliosis 03/21/2017    Priority: Medium  . Osteoporosis 06/29/2009    Priority: Medium  . 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    Priority: Medium  . GERD 05/01/1999    Priority: Medium  . Hearing loss associated with syndrome of both ears 12/05/2016    Priority: Low  . Peripheral neuropathy, idiopathic 03/06/2012    Priority: Low  . COLONIC POLYPS 05/25/2006    Priority: Low   Past Surgical History:  Procedure Laterality Date  . BREAST BIOPSY    . BREAST LUMPECTOMY  2006    Right -- S/P RADIATION--   NO RECURRENCE  . DILATION AND CURETTAGE OF UTERUS    . EXTRACORPOREAL SHOCK WAVE LITHOTRIPSY     X2  . HYSTEROSCOPY    . HYSTEROSCOPY W/D&C  08/03/2012   Procedure: DILATATION AND CURETTAGE /HYSTEROSCOPY;  Surgeon: Anastasio Auerbach, MD;  Location: [facility];  Service: Gynecology;  Laterality: N/A;  . SUPRANUMERARY NIPPLE EXCISION  1973   CYST  . TONSILLECTOMY  AGE 35    Family History  Problem Relation Age of Onset  . Dementia Mother   . Depression Mother   . Osteoporosis Mother   . Stroke Father   . Heart attack Father 70  . Rectal cancer Maternal Grandmother   . Colon cancer Maternal Grandmother   . Stroke Sister     TIA. ? dementia    Medications- reviewed and updated Current Outpatient Prescriptions  Medication Sig Dispense Refill  . denosumab (PROLIA) 60 MG/ML SOLN injection Inject 60 mg into the skin every 6 (six) months. Administer in upper arm, thigh, or abdomen    . esomeprazole (NEXIUM) 40 MG capsule Take 1 capsule (40 mg total) by mouth daily at 12 noon. 90 capsule 4  . Multiple Vitamin (MULTIVITAMIN) tablet Take 1 tablet by mouth daily.      Allergies-reviewed  and updated Allergies  Allergen Reactions  . Amoxicillin Diarrhea    GI upset   Social History   Social History  . Marital status: Married    Spouse name: N/A  . Number of children: 1  . Years of education: N/A   Occupational History  . Retired-Teacher    Social History Main Topics  . Smoking status: Never Smoker  . Smokeless tobacco: Never Used  . Alcohol use 3.6 oz/week    6 Standard drinks or equivalent per week  . Drug use: No  . Sexual activity: Yes    Birth control/ protection: Post-menopausal     Comment: 1st intercourse 71 yo-Fewer than 5 partners   Other Topics Concern  . Not on file   Social History Narrative   Married (husband patient of Dr. Yong Channel). 1 daughter. 1 granddaughter.       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       Hobbies: reading, travel, time  with granddaughter. Very active    Objective: BP 134/64 (BP Location: Left Arm, Patient Position: Sitting, Cuff Size: Normal)   Pulse 88   Temp 97.6 F (36.4 C) (Oral)   Wt 141 lb 12.8 oz (64.3 kg)   SpO2 99%   BMI 22.54 kg/m  Gen: NAD, resting comfortably HEENT: Mucous membranes are moist. Oropharynx normal Neck: no thyromegaly CV: RRR no murmurs rubs or gallops Lungs: CTAB no crackles, wheeze, rhonchi Abdomen: soft/nontender/nondistended/normal bowel sounds. No rebound or guarding.  Ext: no edema Skin: warm, dry Neuro: grossly normal, moves all extremities, PERRLA msk- see notes below  Assessment/Plan:  Rib pain on left side S:sore for a few years. Feels better if does not sleep on that side- worse if she sleeps on that side. Slightly worse recently. Feels more prominent ribs on left side but does have curvature of spine from scoliosis- concerned may be enlarging O: scoliosis noted backwards C shape when looking from back. Left lower ribs seem more prominent than the right sie A/P:  Will get left sided rib films to make sure no bony abnormality- suspect prominence from curvature in spine  Osteoporosis S: right femoral neck -2.6 with bone density 10/14/16. Has had 3-4 prolia injections every 6 months. Prior managed by OB GYN--> now Dr. Phineas Real managing and injecting.   Fosamax- gradually worsening and had reflux per her report(10-15 years) boniva- gradually worsening Tamoxifen- gradually worsening Prolia- for last 12 months at least.  A/P: repeat planned in November. She asks my opinion and I told her the prolia is very reasonable. We can reevaluate after bone density (unfortunately dexa equipment changed in last few years - next one compared to 2017 should be on same equipment- she states this would actually be the 3rd on same equipment  Per avs- Need at least 800 IU vitamin D per day (MV planned). Calcium goal through diet of 1200mg   Due to kidney stones history  GERD S:  had been on nexium 40mg  under Dr. Olevia Perches A/P: trial zantac 150mg  twice a day with meals for 5 days- if drastic worsening in baseline symptoms- will restart nexium  CPE January- though we may see each other sooner if worsening rib pain or if any concerning findings on x-ray  Orders Placed This Encounter  Procedures  . DG Ribs Unilateral Left    Standing Status:   Future    Standing Expiration Date:   05/21/2018    Order Specific Question:   Reason for Exam (SYMPTOM  OR DIAGNOSIS REQUIRED)  Answer:   left lower rib pain for several years- ribs seem more prominent- suspect from scoliosis    Order Specific Question:   Preferred imaging location?    Answer:   [facility]    Order Specific Question:   Radiology Contrast Protocol - do NOT remove file path    Answer:   [email or web address]   Return precautions advised.   Garret Reddish, MD

## 2017-03-21 NOTE — Assessment & Plan Note (Signed)
S: had been on nexium 40mg  under Dr. Olevia Perches A/P: trial zantac 150mg  twice a day with meals for 5 days- if drastic worsening in baseline symptoms- will restart nexium

## 2017-03-21 NOTE — Patient Instructions (Addendum)
trial zantac 150mg  twice a day with meals for 5 days- if drastic worsening in baseline symptoms- will restart nexium  Continue prolia- Dr. Phineas Real doing a great job with this  Need at least 800 IU vitamin D per day. Calcium goal through diet of 1200mg    See you in January for physical- sooner if needed  Please go to [facility] - located [address] across the street from [place] - in the basement - Hours: 8:30-5:30 PM M-F. Do not need appointment.   _______________________________________________________________ WE NOW OFFER   [facility] FAST TRACK!!!  SAME DAY Appointments for ACUTE CARE  Such as: Sprains, Injuries, cuts, abrasions, rashes, muscle pain, joint pain, back pain Colds, flu, sore throats, headache, allergies, cough, fever  Ear pain, sinus and eye infections Abdominal pain, nausea, vomiting, diarrhea, upset stomach Animal/insect bites  3 Easy Ways to Schedule: Walk-In Scheduling Call in scheduling Mychart Sign-up: [email or web address]

## 2017-03-21 NOTE — Assessment & Plan Note (Signed)
S:sore for a few years. Feels better if does not sleep on that side- worse if she sleeps on that side. Slightly worse recently. Feels more prominent ribs on left side but does have curvature of spine from scoliosis- concerned may be enlarging O: scoliosis noted backwards C shape when looking from back. Left lower ribs seem more prominent than the right sie A/P:  Will get left sided rib films to make sure no bony abnormality- suspect prominence from curvature in spine

## 2017-03-23 ENCOUNTER — Ambulatory Visit (INDEPENDENT_AMBULATORY_CARE_PROVIDER_SITE_OTHER)
Admission: RE | Admit: 2017-03-23 | Discharge: 2017-03-23 | Disposition: A | Discharge status: Home / Self Care | Payer: Medicare Other | Source: Ambulatory Visit | Attending: Family Medicine | Admitting: Family Medicine

## 2017-03-23 ENCOUNTER — Encounter: Payer: Self-pay | Admitting: Family Medicine

## 2017-03-23 DIAGNOSIS — R0781 Pleurodynia: Secondary | ICD-10-CM

## 2017-04-13 ENCOUNTER — Telehealth: Payer: Self-pay

## 2017-04-26 ENCOUNTER — Telehealth: Payer: Self-pay | Admitting: Gynecology

## 2017-04-26 NOTE — Telephone Encounter (Addendum)
prolia due after 05/03/17. Complete TF 07/2016, Calcium 9.2  11/29/16.  Deductible (met)  Co pay $50 Prolia and administration, Pt subject to $40 co pay with/without OV.  approx cost $90. OOPM $4000( $80 met). L/M to call me

## 2017-05-08 NOTE — Telephone Encounter (Signed)
PC to [facility] to schedule Prolia injection left message to call me back.

## 2017-05-10 ENCOUNTER — Encounter: Payer: Self-pay | Admitting: Gynecology

## 2017-05-15 NOTE — Telephone Encounter (Signed)
PC to pt again . She left message having back problems ?? If she should take Prolia. I called LM on VM to call me back. I told her that she needs to come in for consult with Dr Phineas Real to discuss.

## 2017-05-15 NOTE — Telephone Encounter (Signed)
Erroneous note

## 2017-05-16 NOTE — Telephone Encounter (Signed)
PC to pt  I advised her to come in for consult with Dr Phineas Real prior to Prolia. She is concerned about neck and back pain and has numerous questions regarding continued treatment with Prolia injection.

## 2017-05-23 ENCOUNTER — Encounter: Payer: Self-pay | Admitting: Gynecology

## 2017-05-23 ENCOUNTER — Ambulatory Visit (INDEPENDENT_AMBULATORY_CARE_PROVIDER_SITE_OTHER): Payer: Medicare Other | Admitting: Gynecology

## 2017-05-23 VITALS — BP 120/76

## 2017-05-23 DIAGNOSIS — M810 Age-related osteoporosis without current pathological fracture: Secondary | ICD-10-CM

## 2017-05-23 NOTE — Patient Instructions (Signed)
Office will call you to arrange the appointment with Dr Cruzita Lederer

## 2017-05-23 NOTE — Progress Notes (Signed)
    Dana Chambers [birth year] [id number]        70 y.o.  G1P1001 presents to discuss her ongoing management of her osteoporosis.  She currently is on Prolia approaching 4 years of therapy. She previously had been on bisphosphonates for roughly 10 years on and off and then a drug-free holiday.  Her DEXA 2016 at [facility] T score -2.6 right femoral neck -2.7 left femoral neck. Unfortunately they switched machines making comparisons difficult. A short interval study one year later at 09/2016 was done showing stability at the right femoral neck at -2.6 and some improvement at the left femoral neck from -2.7 to -2.4. Distal radius -2.1 with a 3% loss from prior study. They did not compare right total hip, left total hip and AP spine to prior studies.  Past medical history,surgical history, problem list, medications, allergies, family history and social history were all reviewed and documented in the EPIC chart.  Directed ROS with pertinent positives and negatives documented in the history of present illness/assessment and plan.  Exam: Vitals:   05/23/17 1437  BP: 120/76   General appearance:  Normal   Assessment/Plan:  71 y.o. G1P1001 with osteoporosis. Currently on Prolia approaching 4 years.  Comparing previous DEXA studies difficult as machines were changed and formula used to recalculate values for comparison which I do not feel are exacting. Options at this point would be to discontinue Prolia and restudy her in another year or 2. Issues of accelerated vertebral bone loss after stopping Prolia discussed and whether to add back a bisphosphate in the interim either orally or with Reclast. Alternatives would be to continue Prolia another year and again restudy her.  Lastly options for consultation with endocrinologist to review her picture and offer recommendations also discussed. This point the patient feels comfortable with endocrinology review and I'm going to refer her to Dr. Cruzita Lederer for her input  and recommendations.  Greater than 50% of my time was spent in direct face to face counseling and coordination of care with the patient.     Anastasio Auerbach MD, 3:02 PM 05/23/2017

## 2017-05-24 ENCOUNTER — Telehealth: Payer: Self-pay | Admitting: *Deleted

## 2017-05-24 DIAGNOSIS — M818 Other osteoporosis without current pathological fracture: Secondary | ICD-10-CM

## 2017-05-24 NOTE — Telephone Encounter (Signed)
Arranging an appointment with Dr Cruzita Lederer reference osteoporosis.  Referral placed they will contact pt to schedule.

## 2017-05-29 NOTE — Telephone Encounter (Signed)
Appointment on 08/10/17 @ 8:15am

## 2017-05-30 NOTE — Telephone Encounter (Signed)
Arranging an appointment with Dr Cruzita Lederer reference osteoporosis per note from [place] per TF.

## 2017-06-01 NOTE — Progress Notes (Signed)
Subjective:   Dana Chambers is a 71 y.o. female who presents for Medicare Annual (Subsequent) preventive examination.  Pt states that she is still have some left rib area pain.   Pt states she is back on her Nexium, the Zantac did not work for her.  Pt states that she has some chronic leg and feet numbness since before January. Pt states she was told it was peripheral neuropathy but was not formally diagnosed.   Pt states Dr Ubaldo Glassing (dermatologist) removed basal cell carcinoma spots from her left ankle and right thigh. She was told no other interventions were needed. Some redness noted to ankle removal spot.   Pt states that Dr Phineas Real Engineering geologist) does not think the Prohlia is as beneficial and preferred and is sending her to Dr Cruzita Lederer to get a second opinion. Prohlia on hold for now.  Review of Systems:  No ROS.  Medicare Wellness Visit. Additional risk factors are reflected in the social history.  Cardiac Risk Factors include: advanced age (>75men, >21 women)   Sleep patterns: Wakes up in the night due to chronic right rib pain.   Home Safety/Smoke Alarms: Feels safe in home. Smoke alarms in place.  Living environment; residence and Firearm Safety: Lives with husband in two story home.  Seat Belt Safety/Bike Helmet: Wears seat belt.   Counseling:   Dental- Every 6 months.   Female:   Pap- Sees Dr Phineas Real, pt believes she is due in September.   Mammo- 09/09/2016  Negative Dexa scan-   10/14/2016  Osteoporosis. Will discuss with Dr Donald Pore in September.  CCS- 01/26/2017 5 year recall    Objective:     Vitals: BP 120/82 (BP Location: Left Arm, Patient Position: Sitting, Cuff Size: Normal)   Pulse 82   Resp 18   Ht 5\' 7"  (1.702 m)   Wt 143 lb 8 oz (65.1 kg)   SpO2 95%   BMI 22.48 kg/m   Body mass index is 22.48 kg/m.   Tobacco History  Smoking Status  . Never Smoker  Smokeless Tobacco  . Never Used     Counseling given: Not Answered   Past Medical  History:  Diagnosis Date  . Basal cell carcinoma of skin    left ankle, right thigh  . Bursitis    right hip  . Cancer [facility]) 2006   Breast/right  . Diverticulosis   . GERD (gastroesophageal reflux disease)   . History of breast cancer/33 radiation treatments/ no chemo    2006  S/P RIGHT LUMPECTOMY AND RADIATION FOLLOWED BY DR RUBIN--  NO RECURRENCE  . History of kidney stones   . Hyperplastic colon polyp   . IBS (irritable bowel syndrome)   . Idiopathic peripheral neuropathy    bilateral tingling/ due to fall in [place]  . Internal hemorrhoid   . Kidney stone   . Leiomyoma    Multiple small  . Liver cyst 2006   Benign  . OA (osteoarthritis)   . Osteoporosis 09/2015   T score -2.7  . Raynaud disease   . Renal calculus, left    non obstructive  . Rib pain on left side    able to lay on left side  . Rosacea   . Tinnitus    Past Surgical History:  Procedure Laterality Date  . BREAST BIOPSY    . BREAST LUMPECTOMY  2006    Right -- S/P RADIATION--  NO RECURRENCE  . DILATION AND CURETTAGE OF UTERUS    .  EXTRACORPOREAL SHOCK WAVE LITHOTRIPSY     X2  . HYSTEROSCOPY    . HYSTEROSCOPY W/D&C  08/03/2012   Procedure: DILATATION AND CURETTAGE /HYSTEROSCOPY;  Surgeon: Anastasio Auerbach, MD;  Location: [facility];  Service: Gynecology;  Laterality: N/A;  . SUPRANUMERARY NIPPLE EXCISION  1973   CYST  . TONSILLECTOMY  AGE 70   Family History  Problem Relation Age of Onset  . Dementia Mother   . Depression Mother   . Osteoporosis Mother   . Stroke Father   . Heart attack Father 17  . Rectal cancer Maternal Grandmother   . Colon cancer Maternal Grandmother   . Stroke Sister        TIA. ? dementia   History  Sexual Activity  . Sexual activity: Yes  . Birth control/ protection: Post-menopausal    Comment: 1st intercourse 71 yo-Fewer than 5 partners    Outpatient Encounter Prescriptions as of 06/07/2017  Medication Sig  . denosumab (PROLIA) 60 MG/ML SOLN  injection Inject 60 mg into the skin every 6 (six) months. Administer in upper arm, thigh, or abdomen  . esomeprazole (NEXIUM) 40 MG capsule Take 1 capsule (40 mg total) by mouth daily at 12 noon.  . Multiple Vitamin (MULTIVITAMIN) tablet Take 1 tablet by mouth daily.    No facility-administered encounter medications on file as of 06/07/2017.     Activities of Daily Living In your present state of health, do you have any difficulty performing the following activities: 06/07/2017  Hearing? N  Vision? N  Difficulty concentrating or making decisions? Y  Walking or climbing stairs? N  Dressing or bathing? N  Doing errands, shopping? N  Preparing Food and eating ? N  Using the Toilet? N  In the past six months, have you accidently leaked urine? N  Do you have problems with loss of bowel control? N  Managing your Medications? N  Managing your Finances? N  Housekeeping or managing your Housekeeping? N  Some recent data might be hidden    Patient Care Team: Marin Olp, MD as PCP - General (Family Medicine)    Assessment:    Physical assessment deferred to PCP.  Exercise Activities and Dietary recommendations Current Exercise Habits: Structured exercise class, Type of exercise: Other - see comments (water walking), Time (Minutes): 45, Frequency (Times/Week): 2, Weekly Exercise (Minutes/Week): 90, Intensity: Mild, Exercise limited by: Other - see comments   Diet (meal preparation, eat out, water intake, caffeinated beverages, dairy products, fruits and vegetables): Drinks lemonade, tea, juice. Breakfast: Light breakfast.  Sometimes skips. Usually cereal or yogurt. Occ bacon and eggs. Lunch: Sandwich or leftovers.  Dinner:  [employer]. Salads/fish/chicken. Watches calcium in diet. Avoids red meat.   Goals    . Exercise 150 minutes per week (moderate activity)          Continue with physical exercise;  Could use another day of gym; will walk more but will explore other      Fall  Risk Fall Risk  06/07/2017 05/20/2016 05/20/2016 11/17/2015 10/09/2014  Falls in the past year? No No No No No   Depression Screen PHQ 2/9 Scores 06/07/2017 05/20/2016 05/20/2016 11/17/2015  PHQ - 2 Score 0 0 0 0     Cognitive Function MMSE - Mini Mental State Exam 05/20/2016  Not completed: (No Data)     Ad8 score reviewed for issues:  Issues making decisions:no  Less interest in hobbies / activities:no  Repeats questions, stories (family complaining):no  Trouble using ordinary  gadgets (microwave, computer, phone):no  Forgets the month or year: no  Mismanaging finances: no  Remembering appts:no  Daily problems with thinking and/or memory:no Ad8 score is=0    Immunization History  Administered Date(s) Administered  . Influenza Whole 10/29/2009, 08/11/2010, 08/19/2011  . Influenza, High Dose Seasonal PF 09/29/2016  . Influenza,inj,Quad PF,36+ Mos 08/21/2013, 09/02/2014  . Influenza-Unspecified 09/09/2015  . Pneumococcal Conjugate-13 09/02/2014  . Pneumococcal Polysaccharide-23 03/06/2012  . Tdap 03/06/2012   Screening Tests Health Maintenance  Topic Date Due  . Hepatitis C Screening  11-Feb-1946  . INFLUENZA VACCINE  06/28/2017  . MAMMOGRAM  09/09/2018  . COLONOSCOPY  01/26/2022  . TETANUS/TDAP  03/06/2022  . DEXA SCAN  Completed  . PNA vac Low Risk Adult  Completed      Plan:   Follow up with Dr Yong Channel suggested for continued left rib area pain, leg and feet numbness. Pt states she will try to wait until her next appt. I suggested if it continues or gets worse she make an appt sooner.  I suggested she call Dr Ubaldo Glassing (derm) about the continued redness to skin removal area. No drainage noticed.   Bring a copy of your advance directives to your next office visit.  I have personally reviewed and noted the following in the patient's chart:   . Medical and social history . Use of alcohol, tobacco or illicit drugs  . Current medications and supplements . Functional  ability and status . Nutritional status . Physical activity . Advanced directives . List of other physicians . Vitals . Screenings to include cognitive, depression, and falls . Referrals and appointments  In addition, I have reviewed and discussed with patient certain preventive protocols, quality metrics, and best practice recommendations. A written personalized care plan for preventive services as well as general preventive health recommendations were provided to patient.     Ree Edman, RN  06/07/2017

## 2017-06-01 NOTE — Progress Notes (Signed)
Pre visit review using our clinic review tool, if applicable. No additional management support is needed unless otherwise documented below in the visit note. 

## 2017-06-07 ENCOUNTER — Ambulatory Visit (INDEPENDENT_AMBULATORY_CARE_PROVIDER_SITE_OTHER): Payer: Medicare Other

## 2017-06-07 VITALS — BP 120/82 | HR 82 | Resp 18 | Ht 67.0 in | Wt 143.5 lb

## 2017-06-07 DIAGNOSIS — Z0000 Encounter for general adult medical examination without abnormal findings: Secondary | ICD-10-CM | POA: Diagnosis not present

## 2017-06-07 NOTE — Progress Notes (Signed)
I have reviewed and agree with note, evaluation, plan. Agree with sooner appointment for worsening pain. Will follow up on osteoporosis plans at follow up.   Garret Reddish, MD

## 2017-06-07 NOTE — Patient Instructions (Signed)
Bring a copy of your advance directives to your next office visit. Follow up with PCP as directed. Preventive Care 51 Years and Older, Female Preventive care refers to lifestyle choices and visits with your health care provider that can promote health and wellness. What does preventive care include?  A yearly physical exam. This is also called an annual well check.  Dental exams once or twice a year.  Routine eye exams. Ask your health care provider how often you should have your eyes checked.  Personal lifestyle choices, including: ? Daily care of your teeth and gums. ? Regular physical activity. ? Eating a healthy diet. ? Avoiding tobacco and drug use. ? Limiting alcohol use. ? Practicing safe sex. ? Taking low-dose aspirin every day. ? Taking vitamin and mineral supplements as recommended by your health care provider. What happens during an annual well check? The services and screenings done by your health care provider during your annual well check will depend on your age, overall health, lifestyle risk factors, and family history of disease. Counseling Your health care provider may ask you questions about your:  Alcohol use.  Tobacco use.  Drug use.  Emotional well-being.  Home and relationship well-being.  Sexual activity.  Eating habits.  History of falls.  Memory and ability to understand (cognition).  Work and work Statistician.  Reproductive health.  Screening You may have the following tests or measurements:  Height, weight, and BMI.  Blood pressure.  Lipid and cholesterol levels. These may be checked every 5 years, or more frequently if you are over 42 years old.  Skin check.  Lung cancer screening. You may have this screening every year starting at age 47 if you have a 30-pack-year history of smoking and currently smoke or have quit within the past 15 years.  Fecal occult blood test (FOBT) of the stool. You may have this test every year  starting at age 34.  Flexible sigmoidoscopy or colonoscopy. You may have a sigmoidoscopy every 5 years or a colonoscopy every 10 years starting at age 68.  Hepatitis C blood test.  Hepatitis B blood test.  Sexually transmitted disease (STD) testing.  Diabetes screening. This is done by checking your blood sugar (glucose) after you have not eaten for a while (fasting). You may have this done every 1-3 years.  Bone density scan. This is done to screen for osteoporosis. You may have this done starting at age 36.  Mammogram. This may be done every 1-2 years. Talk to your health care provider about how often you should have regular mammograms.  Talk with your health care provider about your test results, treatment options, and if necessary, the need for more tests. Vaccines Your health care provider may recommend certain vaccines, such as:  Influenza vaccine. This is recommended every year.  Tetanus, diphtheria, and acellular pertussis (Tdap, Td) vaccine. You may need a Td booster every 10 years.  Varicella vaccine. You may need this if you have not been vaccinated.  Zoster vaccine. You may need this after age 28.  Measles, mumps, and rubella (MMR) vaccine. You may need at least one dose of MMR if you were born in 1957 or later. You may also need a second dose.  Pneumococcal 13-valent conjugate (PCV13) vaccine. One dose is recommended after age 59.  Pneumococcal polysaccharide (PPSV23) vaccine. One dose is recommended after age 30.  Meningococcal vaccine. You may need this if you have certain conditions.  Hepatitis A vaccine. You may need this if you  have certain conditions or if you travel or work in places where you may be exposed to hepatitis A.  Hepatitis B vaccine. You may need this if you have certain conditions or if you travel or work in places where you may be exposed to hepatitis B.  Haemophilus influenzae type b (Hib) vaccine. You may need this if you have certain  conditions.  Talk to your health care provider about which screenings and vaccines you need and how often you need them. This information is not intended to replace advice given to you by your health care provider. Make sure you discuss any questions you have with your health care provider. Document Released: 12/11/2015 Document Revised: 08/03/2016 Document Reviewed: 09/15/2015 Elsevier Interactive Patient Education  2017 [employer].

## 2017-06-13 ENCOUNTER — Telehealth: Payer: Self-pay | Admitting: Family Medicine

## 2017-06-13 NOTE — Telephone Encounter (Signed)
Takes prolia every 6 mos. Appt w/ Gherghe not till september. Wants to know if she should wait for her second opinion RE bone density b/c she will be due for another prolia before her visit to endo.  ok to leave message on voicemail.  Thank you,  -LL

## 2017-06-15 NOTE — Telephone Encounter (Signed)
I would get the next Prolia before the appt with me, she should not delay the dose.

## 2017-06-15 NOTE — Telephone Encounter (Signed)
Please advise. Thank you

## 2017-06-16 ENCOUNTER — Telehealth: Payer: Self-pay

## 2017-06-16 NOTE — Telephone Encounter (Signed)
Called and notified patient of MD note. No questions at this time. 

## 2017-06-16 NOTE — Telephone Encounter (Signed)
Called and notified patient of MD note. Patient understood and would contact Dr.Fontaine's office, no other questions.

## 2017-06-19 ENCOUNTER — Encounter: Payer: Self-pay | Admitting: Family Medicine

## 2017-06-19 NOTE — Telephone Encounter (Addendum)
PC from pt Dr Cruzita Lederer appointment in Sept., her nurse talked with Dana Chambers and told her that Dana Chambers wanted her to have the Prolia injection that was due and then they would discuss further treatments. [name] will make the $50 co pay and wait on the $40 ?? Co pay ment due to this was not clear on the SOB from [place]. Calcium level current. She will call and make a appointment . Appointment for 06/23/17 Prolia

## 2017-06-23 ENCOUNTER — Ambulatory Visit (INDEPENDENT_AMBULATORY_CARE_PROVIDER_SITE_OTHER): Payer: Medicare Other | Admitting: Anesthesiology

## 2017-06-23 DIAGNOSIS — M810 Age-related osteoporosis without current pathological fracture: Secondary | ICD-10-CM | POA: Diagnosis not present

## 2017-06-23 MED ORDER — DENOSUMAB 60 MG/ML ~~LOC~~ SOLN
60.0000 mg | Freq: Once | SUBCUTANEOUS | Status: AC
  Administered 2017-06-23: 60 mg via SUBCUTANEOUS

## 2017-06-26 NOTE — Telephone Encounter (Signed)
prolia given 06/23/17 next due 12/25/17 if MD suggest to continue.

## 2017-08-03 ENCOUNTER — Other Ambulatory Visit: Payer: Self-pay | Admitting: Gynecology

## 2017-08-03 DIAGNOSIS — Z1231 Encounter for screening mammogram for malignant neoplasm of breast: Secondary | ICD-10-CM

## 2017-08-10 ENCOUNTER — Ambulatory Visit (INDEPENDENT_AMBULATORY_CARE_PROVIDER_SITE_OTHER): Payer: Medicare Other | Admitting: Internal Medicine

## 2017-08-10 ENCOUNTER — Encounter: Payer: Self-pay | Admitting: Internal Medicine

## 2017-08-10 ENCOUNTER — Other Ambulatory Visit (INDEPENDENT_AMBULATORY_CARE_PROVIDER_SITE_OTHER): Payer: Medicare Other

## 2017-08-10 VITALS — BP 112/82 | HR 77 | Resp 16 | Ht 66.5 in | Wt 134.6 lb

## 2017-08-10 DIAGNOSIS — M818 Other osteoporosis without current pathological fracture: Secondary | ICD-10-CM

## 2017-08-10 LAB — VITAMIN D 25 HYDROXY (VIT D DEFICIENCY, FRACTURES): VITD: 26.1 ng/mL — ABNORMAL LOW (ref 30.00–100.00)

## 2017-08-10 NOTE — Patient Instructions (Addendum)
Please make sure you get at least 1000 mg calcium per day (best from diet).  Continue Prolia for now.  Check out the book:   Please stop at the lab.  Please return in 1 year.  How Can I Prevent Falls? Men and women with osteoporosis need to take care not to fall down. Falls can break bones. Some reasons people fall are: Poor vision  Poor balance  Certain diseases that affect how you walk  Some types of medicine, such as sleeping pills.  Some tips to help prevent falls outdoors are: Use a cane or walker  Wear rubber-soled shoes so you don't slip  Walk on grass when sidewalks are slippery  In winter, put salt or kitty litter on icy sidewalks.  Some ways to help prevent falls indoors are: Keep rooms free of clutter, especially on floors  Use plastic or carpet runners on slippery floors  Wear low-heeled shoes that provide good support  Do not walk in socks, stockings, or slippers  Be sure carpets and area rugs have skid-proof backs or are tacked to the floor  Be sure stairs are well lit and have rails on both sides  Put grab bars on bathroom walls near tub, shower, and toilet  Use a rubber bath mat in the shower or tub  Keep a flashlight next to your bed  Use a sturdy step stool with a handrail and wide steps  Add more lights in rooms (and night lights) Buy a cordless phone to keep with you so that you don't have to rush to the phone       when it rings and so that you can call for help if you fall.   (adapted from http://www.niams.NightlifePreviews.se)  Exercise for Strong Bones (from [place]) There are two types of exercises that are important for building and maintaining bone density:  weight-bearing and muscle-strengthening exercises. Weight-bearing Exercises These exercises include activities that make you move against gravity while staying upright. Weight-bearing exercises can be high-impact or  low-impact. High-impact weight-bearing exercises help build bones and keep them strong. If you have broken a bone due to osteoporosis or are at risk of breaking a bone, you may need to avoid high-impact exercises. If you're not sure, you should check with your healthcare provider. Examples of high-impact weight-bearing exercises are: . Dancing . Doing high-impact aerobics . Hiking . Jogging/running . Jumping Rope . Stair climbing . Tennis Low-impact weight-bearing exercises can also help keep bones strong and are a safe alternative if you cannot do high-impact exercises. Examples of low-impact weight-bearing exercises are: . Using elliptical training machines . Doing low-impact aerobics . Using stair-step machines . Fast walking on a treadmill or outside Muscle-Strengthening Exercises These exercises include activities where you move your body, a weight or some other resistance against gravity. They are also known as resistance exercises and include: . Lifting weights . Using elastic exercise bands . Using weight machines . Lifting your own body weight . Functional movements, such as standing and rising up on your toes Yoga and Pilates can also improve strength, balance and flexibility. However, certain positions may not be safe for people with osteoporosis or those at increased risk of broken bones. For example, exercises that have you bend forward may increase the chance of breaking a bone in the spine. A physical therapist should be able to help you learn which exercises are safe and appropriate for you. Non-Impact Exercises Non-impact exercises can help you to improve balance, posture and  how well you move in everyday activities. These exercises can also help to increase muscle strength and decrease the risk of falls and broken bones. Some of these exercises include: . Balance exercises that strengthen your legs and test your balance, such as Tai Chi, can decrease your risk of  falls. . Posture exercises that improve your posture and reduce rounded or "sloping" shoulders can help you decrease the chance of breaking a bone, especially in the spine. . Functional exercises that improve how well you move can help you with everyday activities and decrease your chance of falling and breaking a bone. For example, if you have trouble getting up from a chair or climbing stairs, you should do these activities as exercises. A physical therapist can teach you balance, posture and functional exercises. Starting a New Exercise Program If you haven't exercised regularly for a while, check with your healthcare provider before beginning a new exercise program-particularly if you have health problems such as heart disease, diabetes or high blood pressure. If you're at high risk of breaking a bone, you should work with a physical therapist to develop a safe exercise program. Once you have your healthcare provider's approval, start slowly. If you've already broken bones in the spine because of osteoporosis, be very careful to avoid activities that require reaching down, bending forward, rapid twisting motions, heavy lifting and those that increase your chance of a fall. As you get started, your muscles may feel sore for a day or two after you exercise. If soreness lasts longer, you may be working too hard and need to ease up. Exercises should be done in a pain-free range of motion. How Much Exercise Do You Need? Weight-bearing exercises 30 minutes on most days of the week. Do a 30-minutesession or multiple sessions spread out throughout the day. The benefits to your bones are the same.   Muscle-strengthening exercises Two to three days per week. If you don't have much time for strengthening/resistance training, do small amounts at a time. You can do just one body part each day. For example do arms one day, legs the next and trunk the next. You can also spread these exercises out during your normal  day.  Balance, posture and functional exercises Every day or as often as needed. You may want to focus on one area more than the others. If you have fallen or lose your balance, spend time doing balance exercises. If you are getting rounded shoulders, work more on posture exercises. If you have trouble climbing stairs or getting up from the couch, do more functional exercises. You can also perform these exercises at one time or spread them during your day. Work with a phyiscal therapist to learn the right exercises for you.

## 2017-08-10 NOTE — Progress Notes (Signed)
Patient ID: Dana Chambers, female   DOB: [birth year], 71 y.o.   MRN: [id number]   HPI  Dana Chambers is a 71 y.o.-year-old female, referred by Dr. Phineas Real, for management of osteoporosis.  Pt was dx with OP in ~2006.  I reviewed pt's DEXA scans: Date L1-L3 T score FN T score 33% distal Radius Ultra distal radius   10/12/2016 ([facility])  -1.4 (-2%)  RFN: -2.6 LFN: -2.4  -2.1 (-2.9%)  n/a  10/12/2015 ([facility])  -1.3 (+7.1%*)  RFN: -2.6 LFN: -2.7 -1.8  n/a  10/09/2013 ([facility])  -1.8  RFN: -2.5 LFN: -2.2 -3.0 (-14.9%*)  -4.5   09/07/2011 ([facility])  n/a RFN: -2.2   -3.1 (+3.0%)   08/26/2009 ([facility])  -1.1  RFN: -1.7 LFN: -1.9     She denies fractures. She fell backwards (slipped) x1 >> on coccyx + hands >> no fxs, but developed traumatic carpal tunnel. No dizziness/vertigo/orthostasis/poor vision.  Previous OP treatments:  - Bisphosphonates (Fosamax, Boniva) x ~10 years, followed by a drug holiday - Prolia x 4 inj now - last 05/2017  No h/o vitamin D deficiency. Reviewed available vit D levels: Lab Results  Component Value Date   VD25OH 50 07/12/2011   VD25OH 55 06/28/2010   VD25OH 45 05/18/2009   VD25OH 37 10/21/2008   VD25OH 36 04/04/2008   Pt is not on calcium and vitamin D. She also eats dairy and green, leafy, vegetables.   + weight bearing exercises - for the last 15 years: [employer] [facility]: water walking 45-60 min 2x a week - using weights.   She does not take high vitamin A doses.  Menopause was at 71 y/o.   Pt does have a FH of osteoporosis: mother (Dowager's hump). Mother's aunt also had this.  No h/o hyper/hypocalcemia or hyperparathyroidism. + h/o kidney stones - had lithotripsy 2x. She has occasional pain in L flank. Lab Results  Component Value Date   CALCIUM 9.2 11/29/2016   CALCIUM 9.3 11/12/2015   CALCIUM 9.0 10/02/2014   CALCIUM 9.0 09/02/2013   CALCIUM 9.5 09/02/2013   CALCIUM 9.8 08/23/2012   CALCIUM 10.0  08/01/2012   CALCIUM 9.7 02/02/2012   CALCIUM 10.1 07/12/2011   CALCIUM 9.7 08/11/2010   No h/o thyrotoxicosis. Reviewed TSH recent levels:  Lab Results  Component Value Date   TSH 1.05 11/29/2016   TSH 1.25 11/12/2015   TSH 1.11 10/02/2014   TSH 1.01 09/02/2013   TSH 1.08 09/02/2013   No h/o CKD. Last BUN/Cr: Lab Results  Component Value Date   BUN 21 11/29/2016   CREATININE 0.80 11/29/2016   She had BrCA 2006, s/p RxTx, no ChTx >> on Tamoxifen x 4.5 years.  She is on Nexium delayed release 40 mg daily.  ROS: Constitutional: no weight gain/loss, no fatigue, + Hot flushes, + poor sleep Eyes: + blurry vision (cataracts), no xerophthalmia ENT: no sore throat, no nodules palpated in throat, no dysphagia/odynophagia, + hoarseness, + hypoacusis, + tinnitus Cardiovascular: no CP/SOB/palpitations/leg swelling Respiratory: no cough/SOB Gastrointestinal: no N/V/D/C/+ heartburn Musculoskeletal: + Both: muscle/joint aches Skin: no rashes Neurological: no tremors/numbness/tingling/dizziness Psychiatric: no depression/anxiety  Past Medical History:  Diagnosis Date  . Basal cell carcinoma of skin    left ankle, right thigh  . Bursitis    right hip  . Cancer [facility]) 2006   Breast/right  . Diverticulosis   . GERD (gastroesophageal reflux disease)   . History of breast cancer/33 radiation treatments/ no chemo    2006  S/P  RIGHT LUMPECTOMY AND RADIATION FOLLOWED BY DR RUBIN--  NO RECURRENCE  . History of kidney stones   . Hyperplastic colon polyp   . IBS (irritable bowel syndrome)   . Idiopathic peripheral neuropathy    bilateral tingling/ due to fall in [place]  . Internal hemorrhoid   . Kidney stone   . Leiomyoma    Multiple small  . Liver cyst 2006   Benign  . OA (osteoarthritis)   . Osteoporosis 09/2015   T score -2.7  . Raynaud disease   . Renal calculus, left    non obstructive  . Rib pain on left side    able to lay on left side  . Rosacea   . Tinnitus    Past  Surgical History:  Procedure Laterality Date  . BREAST BIOPSY    . BREAST LUMPECTOMY  2006    Right -- S/P RADIATION--  NO RECURRENCE  . DILATION AND CURETTAGE OF UTERUS    . EXTRACORPOREAL SHOCK WAVE LITHOTRIPSY     X2  . HYSTEROSCOPY    . HYSTEROSCOPY W/D&C  08/03/2012   Procedure: DILATATION AND CURETTAGE /HYSTEROSCOPY;  Surgeon: Anastasio Auerbach, MD;  Location: [place];  Service: Gynecology;  Laterality: N/A;  . SUPRANUMERARY NIPPLE EXCISION  1973   CYST  . TONSILLECTOMY  AGE 49   Social History   Social History  . Marital status: Married    Spouse name: N/A  . Number of children: 1   Occupational History  . Retired- Psychologist, prison and probation services Also, used to sing >> now hoarseness   Social History Main Topics  . Smoking status: Never Smoker  . Smokeless tobacco: Never Used  . Alcohol use 3.6 oz/week    6 Standard drinks or equivalent per week  . Drug use: No   Social History Narrative   Married (husband patient of Dr. Yong Channel). 1 daughter. 1 granddaughter.       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       Hobbies: reading, travel, time with granddaughter. Very active   Current Outpatient Prescriptions on File Prior to Visit  Medication Sig Dispense Refill  . denosumab (PROLIA) 60 MG/ML SOLN injection Inject 60 mg into the skin every 6 (six) months. Administer in upper arm, thigh, or abdomen    . esomeprazole (NEXIUM) 40 MG capsule Take 1 capsule (40 mg total) by mouth daily at 12 noon. 90 capsule 4  . Multiple Vitamin (MULTIVITAMIN) tablet Take 1 tablet by mouth daily.      No current facility-administered medications on file prior to visit.    Allergies  Allergen Reactions  . Amoxicillin Diarrhea    GI upset   Family History  Problem Relation Age of Onset  . Dementia Mother   . Depression Mother   . Osteoporosis Mother   . Stroke Father   . Heart attack Father 33  . Rectal cancer Maternal Grandmother   . Colon cancer Maternal Grandmother   . Stroke  Sister        TIA. ? dementia    PE: BP 112/82   Pulse 77   Resp 16   Ht 5' 6.5" (1.689 m)   Wt 134 lb 9.6 oz (61.1 kg)   SpO2 97%   BMI 21.40 kg/m  Wt Readings from Last 3 Encounters:  08/10/17 134 lb 9.6 oz (61.1 kg)  06/07/17 143 lb 8 oz (65.1 kg)  03/21/17 141 lb 12.8 oz (64.3 kg)   Constitutional: normalweight, in NAD. No kyphosis.  Eyes: PERRLA, EOMI, no exophthalmos ENT: moist mucous membranes, no thyromegaly, no cervical lymphadenopathy Cardiovascular: RRR, No MRG Respiratory: CTA B Gastrointestinal: abdomen soft, NT, ND, BS+ Musculoskeletal: no deformities, strength intact in all 4 Skin: moist, warm, no rashes Neurological: no tremor with outstretched hands, DTR normal in all 4  Assessment: 1. Osteoporosis  Plan: 1. Osteoporosis - likely postmenopausal, she has FH of OP - Discussed about increased risk of fracture, depending on the T score, greatly increased when the T score is lower than -2.5, but it is actually a continuum and -2.5 should not be regarded as an absolute threshold. We reviewed her DEXA scan reports together, and I explained that based on the T scores, she has an increased risk for fractures. However, the scores are stable since 2016 , I believe an effect of Prolia. We cannot compare directly the scores from 2016 with the ones from 2014 and before, as they were checked on different machines. - she denies fractures but she has a decrease in height from 5 ft 8 in to 5 ft 6.5 in over the years - we reviewed her dietary and supplemental calcium and vitamin D intake. I recommended to make sure she gets at least 1000 mg of calcium daily and I will check vit D today to see if she needs supplementation  - discussed fall precautions   - given handout from [place] Re: weight bearing exercises - needs  to do this every day or at least 5/7 days - we discussed about avoid smoking or >2 drinks of alcohol a day. - We discussed about the  different medication classes, benefits and side effects (including atypical fractures and ONJ - no dental workup in progress or planned).  0 she has been on Bisphosphonates for 10 years before w/o great success of stalling BMD decline, but also w/o fractures. She started Prolia 2 years ago >> discussed about the Freedom extension study - 10 years of Denoumab is safe and protective against fractures:  Lancet Diabetes Endocrinol. 2017 Jul;5(7):513-523. doi: 10.1016/S2213-8587(17)30138-9. Epub 2017 May 22.  10 years of denosumab treatment in postmenopausal women with osteoporosis: results from the phase 3 randomised FREEDOM trial and open-label extension. Bone HG et al.  Denosumab treatment for up to 10 years was associated with low rates of adverse events, low fracture incidence compared with that observed during the original trial, and continued increases in BMD without plateau.  - therefore, we decided to continue Prolia for now, but we did discuss about Teriparatide and Abaloparatide also (would not recommend these for now).plained that, since she has GERD, I would not use oral bisphosphonates, so my first choice would be sq denosumab (Prolia) for 3-6 years, then zoledronic acid (iv Reclast) for 1-2 years. I would use Teriparatide as a last resort. Pt was given reading information about Prolia, and I explained the mechanism of action and expected benefits. We also addressed Tamoxifen and Estrogen. - discussed that drug holidays are not indicated with Prolia tx as she may lose all the benefits of the tx if she stops abruptly - at the end of the tx we can give her a Reclast infusion or 1 year of po Bisphosphonates (second line, as she has GERD) - Will check the following labs today: Orders Placed This Encounter  Procedures  . VITAMIN D 25 Hydroxy (Vit-D Deficiency, Fractures)  . PTH, intact and calcium  - I will check a PTH as her 33% distal radius scored were disproportionately low in the past  c/w  the rest of her T scores. -  if labs normal, will arrange for a Prolia inj - will check a new DEXA scan in another year (2 years from previous) -  I explained that the first indication that the treatment is working is her not having anymore fractures. DEXA scan changes are secondary: unchanged or slightly higher T-scores are desirable - will see pt back in a year  - time spent with the patient: 1 hour, of which >50% was spent in obtaining information about her symptoms, reviewing her previous labs, evaluations, and treatments, counseling her about her condition (please see the discussed topics above), and developing a plan to further investigate it; she had a number of questions which I addressed.  Component     Latest Ref Rng & Units 08/10/2017  Calcium     8.7 - 10.3 mg/dL 9.4  PTH, Intact     15 - 65 pg/mL 48  VITD     30.00 - 100.00 ng/mL 26.10 (L)   PTH and calcium normal. Vit D slightly low >> will start 2000 units daily, but we can go ahead with Prolia.  Philemon Kingdom, MD PhD [facility]

## 2017-08-11 LAB — SPECIMEN STATUS REPORT

## 2017-08-14 LAB — PTH, INTACT AND CALCIUM
Calcium: 9.4 mg/dL (ref 8.7–10.3)
PTH: 48 pg/mL (ref 15–65)

## 2017-08-21 ENCOUNTER — Ambulatory Visit (INDEPENDENT_AMBULATORY_CARE_PROVIDER_SITE_OTHER): Payer: Medicare Other | Admitting: Gynecology

## 2017-08-21 ENCOUNTER — Encounter: Payer: Self-pay | Admitting: Gynecology

## 2017-08-21 ENCOUNTER — Ambulatory Visit (INDEPENDENT_AMBULATORY_CARE_PROVIDER_SITE_OTHER): Payer: Medicare Other

## 2017-08-21 VITALS — BP 120/76 | Ht 66.5 in | Wt 146.0 lb

## 2017-08-21 DIAGNOSIS — N952 Postmenopausal atrophic vaginitis: Secondary | ICD-10-CM

## 2017-08-21 DIAGNOSIS — Z23 Encounter for immunization: Secondary | ICD-10-CM

## 2017-08-21 DIAGNOSIS — Z01411 Encounter for gynecological examination (general) (routine) with abnormal findings: Secondary | ICD-10-CM | POA: Diagnosis not present

## 2017-08-21 DIAGNOSIS — M810 Age-related osteoporosis without current pathological fracture: Secondary | ICD-10-CM

## 2017-08-21 DIAGNOSIS — C50911 Malignant neoplasm of unspecified site of right female breast: Secondary | ICD-10-CM

## 2017-08-21 MED ORDER — FLUCONAZOLE 150 MG PO TABS: 150.0000 mg | ORAL_TABLET | Freq: Once | ORAL | 0 refills | Status: AC

## 2017-08-21 NOTE — Progress Notes (Signed)
    Dana Chambers [birth year] [id number]        70 y.o.  G1P1001 for annual gynecologic exam.  Does note some vaginal itching over the last week or 2. No discharge odor or urinary symptoms such as frequency dysuria or urgency.  Past medical history,surgical history, problem list, medications, allergies, family history and social history were all reviewed and documented as reviewed in the EPIC chart.  ROS:  Performed with pertinent positives and negatives included in the history, assessment and plan.   Additional significant findings :  None   Exam: [name] assistant Vitals:   08/21/17 0931  BP: 120/76  Weight: 146 lb (66.2 kg)  Height: 5' 6.5" (1.689 m)   Body mass index is 23.21 kg/m.  General appearance:  Normal affect, orientation and appearance. Skin: Grossly normal HEENT: Without gross lesions.  No cervical or supraclavicular adenopathy. Thyroid normal.  Lungs:  Clear without wheezing, rales or rhonchi Cardiac: RR, without RMG Abdominal:  Soft, nontender, without masses, guarding, rebound, organomegaly or hernia Breasts:  Examined lying and sitting. Left without masses, retractions, discharge or axillary adenopathy.  Right status post lumpectomy with nipple resection. Well-healed scars. No masses or axillary adenopathy Pelvic:  Ext, BUS, Vagina: With atrophic changes  Cervix: With atrophic changes  Uterus: Anteverted, normal size, shape and contour, midline and mobile nontender   Adnexa: Without masses or tenderness    Anus and perineum: Normal   Rectovaginal: Normal sphincter tone without palpated masses or tenderness.    Assessment/Plan:  71 y.o. G57P1001 female for annual gynecologic exam.   1. Postmenopausal/atrophic genital changes. No significant hot flushes, night sweats, vaginal dryness or any vaginal bleeding. Continue to monitor report any issues or bleeding. 2. Osteoporosis. Recently saw Dr.Gherghe and they are continuing her Prolia through her office.  She'll continue to follow up with her in reference to bone health. Had a low vitamin D level and is supplementing vitamin D now. 3. Right breast cancer. Exam NED. Mammography coming due next month. 4. Pap smear 2016. No Pap smear done today. No history of abnormal Pap smears. Options to stop screening altogether or less frequent screening intervals based on current screening guidelines and age reviewed. Will readdress on annual basis. 5. Colonoscopy 2018. Repeat at their recommended interval. 6. Health maintenance. No routine lab work done as patient does this elsewhere. Follow up 1 year, sooner as needed.   Anastasio Auerbach MD, 10:02 AM 08/21/2017

## 2017-08-21 NOTE — Patient Instructions (Signed)
Followup in one year for annual exam, sooner if any issues 

## 2017-09-12 ENCOUNTER — Ambulatory Visit
Admission: RE | Admit: 2017-09-12 | Discharge: 2017-09-12 | Disposition: A | Discharge status: Home / Self Care | Payer: Medicare Other | Source: Ambulatory Visit | Attending: Gynecology | Admitting: Gynecology

## 2017-09-12 DIAGNOSIS — Z1231 Encounter for screening mammogram for malignant neoplasm of breast: Secondary | ICD-10-CM

## 2017-09-13 ENCOUNTER — Other Ambulatory Visit: Payer: Self-pay | Admitting: Gynecology

## 2017-09-13 DIAGNOSIS — R928 Other abnormal and inconclusive findings on diagnostic imaging of breast: Secondary | ICD-10-CM

## 2017-09-15 ENCOUNTER — Ambulatory Visit: Payer: Medicare Other

## 2017-09-15 ENCOUNTER — Ambulatory Visit
Admission: RE | Admit: 2017-09-15 | Discharge: 2017-09-15 | Disposition: A | Discharge status: Home / Self Care | Payer: Medicare Other | Source: Ambulatory Visit | Attending: Gynecology | Admitting: Gynecology

## 2017-09-15 DIAGNOSIS — R928 Other abnormal and inconclusive findings on diagnostic imaging of breast: Secondary | ICD-10-CM

## 2017-11-27 ENCOUNTER — Telehealth: Payer: Self-pay | Admitting: Gynecology

## 2017-11-27 NOTE — Telephone Encounter (Signed)
prolia due 12/25/17. Per office note from Dr Phineas Real pt will continue Prolia thru Dr Cruzita Lederer office.

## 2017-12-06 ENCOUNTER — Encounter: Payer: Self-pay | Admitting: Family Medicine

## 2017-12-06 ENCOUNTER — Ambulatory Visit (INDEPENDENT_AMBULATORY_CARE_PROVIDER_SITE_OTHER): Payer: Medicare Other | Admitting: Family Medicine

## 2017-12-06 VITALS — BP 120/76 | HR 75 | Temp 98.0°F | Ht 66.5 in | Wt 147.0 lb

## 2017-12-06 DIAGNOSIS — Z0000 Encounter for general adult medical examination without abnormal findings: Secondary | ICD-10-CM | POA: Diagnosis not present

## 2017-12-06 DIAGNOSIS — E785 Hyperlipidemia, unspecified: Secondary | ICD-10-CM

## 2017-12-06 DIAGNOSIS — Z1159 Encounter for screening for other viral diseases: Secondary | ICD-10-CM | POA: Diagnosis not present

## 2017-12-06 DIAGNOSIS — E559 Vitamin D deficiency, unspecified: Secondary | ICD-10-CM

## 2017-12-06 LAB — LIPID PANEL
Cholesterol: 272 mg/dL — ABNORMAL HIGH (ref 0–200)
HDL: 76.1 mg/dL (ref >39.00)
LDL Cholesterol: 179 mg/dL — ABNORMAL HIGH (ref 0–99)
NonHDL: 195.82
Total CHOL/HDL Ratio: 4
Triglycerides: 86 mg/dL (ref 0.0–149.0)
VLDL: 17.2 mg/dL (ref 0.0–40.0)

## 2017-12-06 LAB — CBC
HCT: 41 % (ref 36.0–46.0)
Hemoglobin: 13.6 g/dL (ref 12.0–15.0)
MCHC: 33.2 g/dL (ref 30.0–36.0)
MCV: 92 fl (ref 78.0–100.0)
Platelets: 306 10*3/uL (ref 150.0–400.0)
RBC: 4.46 Mil/uL (ref 3.87–5.11)
RDW: 13.7 % (ref 11.5–15.5)
WBC: 5.6 10*3/uL (ref 4.0–10.5)

## 2017-12-06 LAB — COMPREHENSIVE METABOLIC PANEL
ALT: 11 U/L (ref 0–35)
AST: 17 U/L (ref 0–37)
Albumin: 4.4 g/dL (ref 3.5–5.2)
Alkaline Phosphatase: 53 U/L (ref 39–117)
BUN: 17 mg/dL (ref 6–23)
CO2: 31 mEq/L (ref 19–32)
Calcium: 9.3 mg/dL (ref 8.4–10.5)
Chloride: 102 mEq/L (ref 96–112)
Creatinine, Ser: 0.82 mg/dL (ref 0.40–1.20)
GFR: 72.59 mL/min (ref >60.00)
Glucose, Bld: 98 mg/dL (ref 70–99)
Potassium: 4.3 mEq/L (ref 3.5–5.1)
Sodium: 141 mEq/L (ref 135–145)
Total Bilirubin: 0.5 mg/dL (ref 0.2–1.2)
Total Protein: 7 g/dL (ref 6.0–8.3)

## 2017-12-06 LAB — VITAMIN D 25 HYDROXY (VIT D DEFICIENCY, FRACTURES): VITD: 37.58 ng/mL (ref 30.00–100.00)

## 2017-12-06 NOTE — Progress Notes (Addendum)
Phone: [phone]  Subjective:  Patient presents today for their annual physical. Chief complaint-noted.   See problem oriented charting- ROS- full  review of systems was completed and negative except for: neck pain intermittnet, thigh pain intermittent not same time as neck pain, no stiffness in pelvic girdle. No paresthesias.   The following were reviewed and entered/updated in epic: Past Medical History:  Diagnosis Date  . Basal cell carcinoma of skin    left ankle, right thigh  . Bursitis    right hip  . Cancer [facility]) 2006   Breast/right  . Diverticulosis   . GERD (gastroesophageal reflux disease)   . History of breast cancer/33 radiation treatments/ no chemo    2006  S/P RIGHT LUMPECTOMY AND RADIATION FOLLOWED BY DR RUBIN--  NO RECURRENCE  . History of kidney stones   . Hyperplastic colon polyp   . IBS (irritable bowel syndrome)   . Idiopathic peripheral neuropathy    bilateral tingling/ due to fall in [place]  . Internal hemorrhoid   . Kidney stone   . Leiomyoma    Multiple small  . Liver cyst 2006   Benign  . OA (osteoarthritis)   . Osteoporosis 09/2015   T score -2.7  . Raynaud disease   . Renal calculus, left    non obstructive  . Rib pain on left side    able to lay on left side  . Rosacea   . Tinnitus    Patient Active Problem List   Diagnosis Date Noted  . Rib pain on left side 03/21/2017    Priority: Medium  . Scoliosis 03/21/2017    Priority: Medium  . Osteoporosis 06/29/2009    Priority: Medium  . 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    Priority: Medium  . GERD 05/01/1999    Priority: Medium  . Hearing loss associated with syndrome of both ears 12/05/2016    Priority: Low  . Peripheral neuropathy, idiopathic 03/06/2012    Priority: Low  . COLONIC POLYPS 05/25/2006    Priority: Low   Past Surgical History:  Procedure Laterality Date  . BREAST BIOPSY Left 2016   benign  . BREAST EXCISIONAL BIOPSY Right 1973   benign  .  BREAST LUMPECTOMY  2006    Right -- S/P RADIATION--  NO RECURRENCE  . DILATION AND CURETTAGE OF UTERUS    . EXTRACORPOREAL SHOCK WAVE LITHOTRIPSY     X2  . HYSTEROSCOPY    . HYSTEROSCOPY W/D&C  08/03/2012   Procedure: DILATATION AND CURETTAGE /HYSTEROSCOPY;  Surgeon: Anastasio Auerbach, MD;  Location: [place];  Service: Gynecology;  Laterality: N/A;  . SUPRANUMERARY NIPPLE EXCISION  1973   CYST  . TONSILLECTOMY  AGE 68    Family History  Problem Relation Age of Onset  . Dementia Mother   . Depression Mother   . Osteoporosis Mother   . Stroke Father   . Heart attack Father 10  . Rectal cancer Maternal Grandmother   . Colon cancer Maternal Grandmother   . Stroke Sister        TIA. ? dementia  . Dementia Sister     Medications- reviewed and updated Current Outpatient Medications  Medication Sig Dispense Refill  . Cholecalciferol (VITAMIN D PO) Take by mouth.    . denosumab (PROLIA) 60 MG/ML SOLN injection Inject 60 mg into the skin every 6 (six) months. Administer in upper arm, thigh, or abdomen    . esomeprazole (NEXIUM) 40 MG capsule Take 1  capsule (40 mg total) by mouth daily at 12 noon. 90 capsule 4  . Multiple Vitamin (MULTIVITAMIN) tablet Take 1 tablet by mouth daily.      Allergies-reviewed and updated Allergies  Allergen Reactions  . Amoxicillin Diarrhea    GI upset    Social History   Socioeconomic History  . Marital status: Married    Spouse name: None  . Number of children: 1  . Years of education: None  . Highest education level: None  Social Needs  . Financial resource strain: None  . Food insecurity - worry: None  . Food insecurity - inability: None  . Transportation needs - medical: None  . Transportation needs - non-medical: None  Occupational History  . Occupation: Retired-Teacher  Tobacco Use  . Smoking status: Never Smoker  . Smokeless tobacco: Never Used  Substance and Sexual Activity  . Alcohol use: Yes    Alcohol/week:  3.6 oz    Types: 6 Standard drinks or equivalent per week  . Drug use: No  . Sexual activity: Yes    Birth control/protection: Post-menopausal    Comment: 1st intercourse 72 yo-Fewer than 5 partners  Other Topics Concern  . None  Social History Narrative   Married (husband patient of Dr. Yong Channel). 1 daughter. 1 granddaughter.       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       Hobbies: reading, travel, time with granddaughter. Very active    Objective: BP 120/76 (BP Location: Left Arm, Patient Position: Sitting, Cuff Size: Large)   Pulse 75   Temp 98 F (36.7 C) (Oral)   Ht 5' 6.5" (1.689 m)   Wt 147 lb (66.7 kg)   SpO2 100%   BMI 23.37 kg/m  Gen: NAD, resting comfortably HEENT: Mucous membranes are moist. Oropharynx normal Neck: no thyromegaly CV: RRR no murmurs rubs or gallops Lungs: CTAB no crackles, wheeze, rhonchi Abdomen: soft/nontender/nondistended/normal bowel sounds. No rebound or guarding.  Ext: no edema Skin: warm, dry Neuro: grossly normal, moves all extremities, PERRLA Msk: some muscle tightness and pain improves with massage in neck. No midline pain. Thighs without pain with palpation.   Assessment/Plan:  72 y.o. female presenting for annual physical.  Health Maintenance counseling: 1. Anticipatory guidance: Patient counseled regarding regular dental exams -q6 months, eye exams -yearly or more frequently if needed for styes, wearing seatbelts.  2. Risk factor reduction:  Advised patient of need for regular exercise and diet rich and fruits and vegetables to reduce risk of heart attack and stroke. Exercise- twice a week water exercises. Diet-weight still reasonable did discuss mediterranean diet for help with cholesterol.  Wt Readings from Last 3 Encounters:  12/06/17 147 lb (66.7 kg)  08/21/17 146 lb (66.2 kg)  08/10/17 134 lb 9.6 oz (61.1 kg)  3. Immunizations/screenings/ancillary studies- discussed shingrix at pharmacy.  Immunization History  Administered  Date(s) Administered  . Influenza Whole 10/29/2009, 08/11/2010, 08/19/2011  . Influenza, High Dose Seasonal PF 09/29/2016, 08/21/2017  . Influenza,inj,Quad PF,6+ Mos 08/21/2013, 09/02/2014  . Influenza-Unspecified 09/09/2015  . Pneumococcal Conjugate-13 09/02/2014  . Pneumococcal Polysaccharide-23 03/06/2012  . Tdap 03/06/2012  4. Cervical cancer screening- passed age based screening requirements. Last pap 2016 normal.  5. Breast cancer screening-  breast exam with Dr. Phineas Real and mammogram - yearly. History of Paget's disease of breast with surgery and radiation in 2006  6. Colon cancer screening - adenoma 2018 with 5 year repeat planned.  7. Skin cancer screening- Dr. Ubaldo Glassing on yearly basis. advised regular sunscreen  use. Denies worrisome, changing, or new skin lesions.  8. Osteoporosis- see below  Status of chronic or acute concerns   Slightly low vitamin D- on 2000 IU per day  Orthopedic/MSK issues 1. Neck tension and pressure- does weights in water walking overhead and wonders if she has had some strain- also gets some strain looking at computuers and screens. Minor and intermittent. Very mild symptoms- we will monitor2. Intermittent pain mildly in thighs- soreness seems to linger more in her. Worse with exercise. Better in summer months. Does tend to wear tighter pants. Very mild symptoms- we will monitor 3. Still cant sleep on left side- bothers her left side of ribs- ok in daytime  Hyperlipidemia. Dad died at 71 of heart attack and prior had stroke but was smoker.  Lab Results  Component Value Date   CHOL 265 (H) 11/29/2016   HDL 77.50 11/29/2016   LDLCALC 171 (H) 11/29/2016   LDLDIRECT 168.5 02/02/2012   TRIG 83.0 11/29/2016   CHOLHDL 3 11/29/2016   GERD- on nexium- not ideal for osteoporosis. She did not tolerate going to zantac. When she finishes her 40mg  dose of nexium- just refilled 90- I have asked her to trial OTC 20mg  dose.   Osteoporosis- on prolia. Followed by Dr.  Cruzita Lederer. Had been on fosamax in the past.   Hearing loss- does not use hearing aids. Go with your husband to get [facility]  Future Appointments  Date Time Provider [place]  06/08/2018  9:00 AM Williemae Area, RN [facility]  08/13/2018  9:00 AM Philemon Kingdom, MD [facility] None   Return in about 1 year (around 12/06/2018) for physical.  Preventative health care  Vitamin D deficiency - Plan: VITAMIN D 25 Hydroxy (Vit-D Deficiency, Fractures)  Hyperlipidemia, unspecified hyperlipidemia type - Plan: CBC, Comprehensive metabolic panel, Lipid panel  Encounter for HCV screening test for low risk patient - Plan: Hepatitis C antibody  Return precautions advised.  Garret Reddish, MD

## 2017-12-06 NOTE — Patient Instructions (Addendum)
When you finish your 40mg  dose of nexium-please trial OTC 20mg  dose. Ask your insurance if they would cover this OTC medicine if we sent it in to your pharmacy or if you simply have to buy over the counter.   Go with your husband to get hearing checked at [facility]  did discuss mediterranean diet for help with cholesterol. Your 10 year risk is 11.8%. If it gets above 12% consistently would recommend starting medication.   Please let us know if the leg pain or neck pain become more consistent or intensify- would like to see you back to reevaluate if so  Consider massage for the neck  Also for memory concerns- continue to follow up for yearly annual wellness visits- you already scheduled for July of this year as they do a yearly memory test. Exercising 5 days a week would be ideal for keeping the mind sharp as well  Please stop by lab before you go

## 2017-12-07 LAB — HEPATITIS C ANTIBODY
Hepatitis C Ab: NONREACTIVE
SIGNAL TO CUT-OFF: 0.01 (ref <1.00)

## 2017-12-12 ENCOUNTER — Encounter: Payer: Self-pay | Admitting: Family Medicine

## 2018-02-05 ENCOUNTER — Other Ambulatory Visit: Payer: Self-pay | Admitting: Family Medicine

## 2018-04-16 ENCOUNTER — Telehealth: Payer: Self-pay | Admitting: Gastroenterology

## 2018-04-16 ENCOUNTER — Telehealth: Payer: Self-pay | Admitting: Internal Medicine

## 2018-04-16 NOTE — Telephone Encounter (Signed)
Patient stated at her last visit with Dr Cruzita Lederer she thought that our office would start taking care of her Prolia shots.  She has not heard from anyone and believes this may be over due.    Please advise

## 2018-04-16 NOTE — Telephone Encounter (Signed)
Spoke with the patient. She had empirically treated an eye infection with "left over doxycyline." She reports she has had uncomfortable feeling under her left ribs with gas and burping. She feels her symptoms are "aggrevated by beans and whole grains." Declines an appointment due to travel plans. She wants to know of other options. Discussed Gaviscon, Gas X and FDgard. Encouraged to continue the Nexium as prescribed. She will call when she returns from her travel if her symptoms are persisting.

## 2018-04-16 NOTE — Telephone Encounter (Signed)
Pt would like a call back regarding some sxs that she has been experiencing.

## 2018-04-17 ENCOUNTER — Encounter: Payer: Self-pay | Admitting: Internal Medicine

## 2018-04-30 ENCOUNTER — Encounter: Payer: Self-pay | Admitting: Internal Medicine

## 2018-05-07 NOTE — Telephone Encounter (Signed)
I have submitted to portal and I am waiting to talk to the rep to see what is owed

## 2018-05-08 NOTE — Telephone Encounter (Signed)
Patient scheduled for injection on 05/09/18

## 2018-05-09 ENCOUNTER — Ambulatory Visit (INDEPENDENT_AMBULATORY_CARE_PROVIDER_SITE_OTHER): Payer: Medicare Other

## 2018-05-09 DIAGNOSIS — M818 Other osteoporosis without current pathological fracture: Secondary | ICD-10-CM

## 2018-05-09 MED ORDER — DENOSUMAB 60 MG/ML ~~LOC~~ SOSY
60.0000 mg | PREFILLED_SYRINGE | Freq: Once | SUBCUTANEOUS | Status: AC
  Administered 2018-05-09: 60 mg via SUBCUTANEOUS

## 2018-06-08 ENCOUNTER — Ambulatory Visit: Payer: Medicare Other

## 2018-06-08 ENCOUNTER — Ambulatory Visit: Payer: Medicare Other | Admitting: *Deleted

## 2018-06-21 NOTE — Progress Notes (Signed)
Subjective:   Dana Chambers is a 72 y.o. female who presents for Medicare Annual (Subsequent) preventive examination.  Reports health as good OV jan 2019   Diet BMI 22 Lipids chol/hdl 4; chol 272; hdl 76; trig 86; ldl 179  Will not put on medication now Breakfast - yogurt and fruit; cereal; oatmeal  Lunch; 1/2 sandwich; wrap  Supper cooks or goes out [place] walking at [employer] x 2 per week     There are no preventive care reminders to display for this patient.  Mammogram  08/2017 Breast cancer screening-  - yearly.  Dexa (09/2016) -2.6   for osteoporosis but followed by Dr. Cruzita Lederer Dr. Phineas Real was doing the prolia but now Dr. Cruzita Lederer  Wants to stay with the same machine at [place] cancer screening - adenoma 01/2017 with 5 year repeat planned.    Skin cancer screening- Dr. Ubaldo Glassing on yearly basis. advised regular sunscreen use Maternal grandmother had colon cancer  Pap 07/2015 -  Dr. Phineas Real follows   Had shingrix 02/2018 and has had 2nd dose   Objective:     Vitals: BP 130/80   Ht 5' 6.5" (1.689 m)   Wt 142 lb 4 oz (64.5 kg)   BMI 22.62 kg/m   Body mass index is 22.62 kg/m.  Advanced Directives 08/10/2017 06/07/2017 01/26/2017 05/20/2016  Does Patient Have a Medical Advance Directive? Yes Yes Yes Yes  Type of Advance Directive Living will [place];Living will - -  Does patient want to make changes to medical advance directive? - Yes (MAU/Ambulatory/Procedural Areas - Information given) - -  Copy of [place] in Chart? - No - copy requested - No - copy requested    Tobacco Social History   Tobacco Use  Smoking Status Never Smoker  Smokeless Tobacco Never Used     Counseling given: Yes   Clinical Intake:     Past Medical History:  Diagnosis Date  . Basal cell carcinoma of skin    left ankle, right thigh  . Bursitis    right hip  . Cancer [facility]) 2006   Breast/right  .  Diverticulosis   . GERD (gastroesophageal reflux disease)   . History of breast cancer/33 radiation treatments/ no chemo    2006  S/P RIGHT LUMPECTOMY AND RADIATION FOLLOWED BY DR RUBIN--  NO RECURRENCE  . History of kidney stones   . Hyperplastic colon polyp   . IBS (irritable bowel syndrome)   . Idiopathic peripheral neuropathy    bilateral tingling/ due to fall in [place]  . Internal hemorrhoid   . Kidney stone   . Leiomyoma    Multiple small  . Liver cyst 2006   Benign  . OA (osteoarthritis)   . Osteoporosis 09/2015   T score -2.7  . Raynaud disease   . Renal calculus, left    non obstructive  . Rib pain on left side    able to lay on left side  . Rosacea   . Tinnitus    Past Surgical History:  Procedure Laterality Date  . BREAST BIOPSY Left 2016   benign  . BREAST EXCISIONAL BIOPSY Right 1973   benign  . BREAST LUMPECTOMY  2006    Right -- S/P RADIATION--  NO RECURRENCE  . DILATION AND CURETTAGE OF UTERUS    . EXTRACORPOREAL SHOCK WAVE LITHOTRIPSY     X2  . HYSTEROSCOPY    .  HYSTEROSCOPY W/D&C  08/03/2012   Procedure: DILATATION AND CURETTAGE /HYSTEROSCOPY;  Surgeon: Anastasio Auerbach, MD;  Location: [facility];  Service: Gynecology;  Laterality: N/A;  . SUPRANUMERARY NIPPLE EXCISION  1973   CYST  . TONSILLECTOMY  AGE 50   Family History  Problem Relation Age of Onset  . Dementia Mother   . Depression Mother   . Osteoporosis Mother   . Stroke Father   . Heart attack Father 87  . Rectal cancer Maternal Grandmother   . Colon cancer Maternal Grandmother   . Stroke Sister        TIA. ? dementia  . Dementia Sister    Social History   Socioeconomic History  . Marital status: Married    Spouse name: Not on file  . Number of children: 1  . Years of education: Not on file  . Highest education level: Not on file  Occupational History  . Occupation: Firefighter  Social Needs  . Financial resource strain: Not on file  . Food insecurity:      Worry: Not on file    Inability: Not on file  . Transportation needs:    Medical: Not on file    Non-medical: Not on file  Tobacco Use  . Smoking status: Never Smoker  . Smokeless tobacco: Never Used  Substance and Sexual Activity  . Alcohol use: Yes    Alcohol/week: 3.6 oz    Types: 6 Standard drinks or equivalent per week  . Drug use: No  . Sexual activity: Yes    Birth control/protection: Post-menopausal    Comment: 1st intercourse 72 yo-Fewer than 5 partners  Lifestyle  . Physical activity:    Days per week: Not on file    Minutes per session: Not on file  . Stress: Not on file  Relationships  . Social connections:    Talks on phone: Not on file    Gets together: Not on file    Attends religious service: Not on file    Active member of club or organization: Not on file    Attends meetings of clubs or organizations: Not on file    Relationship status: Not on file  Other Topics Concern  . Not on file  Social History Narrative   Married (husband patient of Dr. Yong Channel). 1 daughter. 1 granddaughter.       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       Hobbies: reading, travel, time with granddaughter. Very active    Outpatient Encounter Medications as of 06/22/2018  Medication Sig  . Cholecalciferol (VITAMIN D PO) Take by mouth.  . denosumab (PROLIA) 60 MG/ML SOLN injection Inject 60 mg into the skin every 6 (six) months. Administer in upper arm, thigh, or abdomen  . esomeprazole (NEXIUM) 40 MG capsule TAKE ONE CAPSULE BY MOUTH EVERY DAY AT NOON  . Multiple Vitamin (MULTIVITAMIN) tablet Take 1 tablet by mouth daily.    No facility-administered encounter medications on file as of 06/22/2018.     Activities of Daily Living No flowsheet data found.  Patient Care Team: Marin Olp, MD as PCP - General (Family Medicine) Rolm Bookbinder, MD as Consulting Physician (Dermatology) Philemon Kingdom, MD as Consulting Physician (Endocrinology) Phineas Real, Belinda Block, MD as  Consulting Physician (Gynecology) Mauri Pole, MD as Consulting Physician (Gastroenterology)    Assessment:   This is a routine wellness examination for [facility].  Exercise Activities and Dietary recommendations    Goals    None  Fall Risk Fall Risk  06/07/2017 05/20/2016 05/20/2016 11/17/2015 10/09/2014  Falls in the past year? No No No No No     Depression Screen PHQ 2/9 Scores 06/07/2017 05/20/2016 05/20/2016 11/17/2015  PHQ - 2 Score 0 0 0 0     Cognitive Function MMSE - Mini Mental State Exam 05/20/2016  Not completed: (No Data)     Ad8 score reviewed for issues:  Issues making decisions:  Less interest in hobbies / activities:  Repeats questions, stories (family complaining):  Trouble using ordinary gadgets (microwave, computer, phone):  Forgets the month or year:   Mismanaging finances:   Remembering appts:  Daily problems with thinking and/or memory: Ad8 score is=sister has memory loss   Education provided      Immunization History  Administered Date(s) Administered  . Influenza Whole 10/29/2009, 08/11/2010, 08/19/2011  . Influenza, High Dose Seasonal PF 09/29/2016, 08/21/2017  . Influenza,inj,Quad PF,6+ Mos 08/21/2013, 09/02/2014  . Influenza-Unspecified 09/09/2015  . Pneumococcal Conjugate-13 09/02/2014  . Pneumococcal Polysaccharide-23 03/06/2012  . Tdap 03/06/2012  . Zoster Recombinat (Shingrix) 03/10/2018, 05/10/2018      Screening Tests Health Maintenance  Topic Date Due  . INFLUENZA VACCINE  06/28/2018  . MAMMOGRAM  09/13/2019  . COLONOSCOPY  01/26/2022  . TETANUS/TDAP  03/06/2022  . DEXA SCAN  Completed  . Hepatitis C Screening  Completed  . PNA vac Low Risk Adult  Completed         Plan:      PCP Notes   Health Maintenance Mammogram  08/2017 Breast cancer screening-  - yearly.  Dexa (09/2016) -2.6   for osteoporosis but followed by Dr. Cruzita Lederer Dr. Phineas Real was doing the prolia but now Dr. Cruzita Lederer  Wants to  stay with the same machine at [place] cancer screening - adenoma 01/2017 with 5 year repeat planned.    Skin cancer screening- Dr. Ubaldo Glassing on yearly basis. advised regular sunscreen use   Pap 07/2015 -  Dr. Phineas Real follows   Had shingrix 02/2018 and has had 2nd dose; entered metric   Abnormal Screens  none   Referrals  none  Patient concerns; Has ongoing and perhaps worsening neuropathy type symptoms of lower extremity Some back pain  Has had hx of fall with back injury  Has been going on for awhile; feels sensation in her legs is worse and going down to her feet. Requested she make an apt prior to trip to rule out issues that can be treated or to make apt for other may take time to secure  Also requesting Scapolamine patch for their first ocean cruise    Nurse Concerns; As noted  Next PCP apt   08/19 at 8;30 for the above        I have personally reviewed and noted the following in the patient's chart:   . Medical and social history . Use of alcohol, tobacco or illicit drugs  . Current medications and supplements . Functional ability and status . Nutritional status . Physical activity . Advanced directives . List of other physicians . Hospitalizations, surgeries, and ER visits in previous 12 months . Vitals . Screenings to include cognitive, depression, and falls . Referrals and appointments  In addition, I have reviewed and discussed with patient certain preventive protocols, quality metrics, and best practice recommendations. A written personalized care plan for preventive services as well as general preventive health recommendations were provided to patient.     Wynetta Fines, RN  06/22/2018

## 2018-06-22 ENCOUNTER — Ambulatory Visit (INDEPENDENT_AMBULATORY_CARE_PROVIDER_SITE_OTHER): Payer: Medicare Other

## 2018-06-22 VITALS — BP 130/80 | HR 59 | Ht 66.5 in | Wt 142.2 lb

## 2018-06-22 DIAGNOSIS — Z0000 Encounter for general adult medical examination without abnormal findings: Secondary | ICD-10-CM | POA: Diagnosis not present

## 2018-06-22 MED ORDER — SCOPOLAMINE 1 MG/3DAYS TD PT72: 1.0000 | MEDICATED_PATCH | TRANSDERMAL | 0 refills | Status: DC

## 2018-06-22 NOTE — Progress Notes (Signed)
I have reviewed and agree with note, evaluation, plan. I agree needs visit for back/neuropathic symptoms. I will send in scopolamine   Garret Reddish, MD

## 2018-06-22 NOTE — Patient Instructions (Addendum)
[name] , Thank you for taking time to come for your Medicare Wellness Visit. I appreciate your ongoing commitment to your health goals. Please review the following plan we discussed and let me know if I can assist you in the future.   Will make apt with Dr. 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   Calcium '1200mg'$  with Vit D 800u per day; more as directed by physician Strength building exercises discussed; can include walking; housework; small weights or stretch bands; silver sneakers if access to the [facility]  Please visit the osteoporosis foundation.org for up to date recommendations Has kidney stones with calcium   You can have a free hearing test anywhere [facility] - can assist with hearing aid x 1  No reviews  [facility]  [address]  [phone]  http://clienthiadev.devcloud.acquia-[email or web address]/default/files/hearingpedia/Guide_How_to_Buy_Hearing_Aids.pdf  Good skin care is important. Glad you go to Dr. Ubaldo Glassing  These are the goals we discussed: Goals    . Patient Stated     Better diet to lower chol Plant based diet  will get hearing checked Possibly try to come off Nexium       This is a list of the screening recommended for you and due dates:  Health Maintenance  Topic Date Due  . Flu Shot  06/28/2018  . Mammogram  09/13/2019  . Colon Cancer Screening  01/26/2022  . Tetanus Vaccine  03/06/2022  . DEXA scan (bone density measurement)  Completed  .  Hepatitis C: One time screening is recommended by Center for Disease Control  (CDC) for  adults born from [birth year] through 1967.   Completed  . Pneumonia vaccines  Completed      Fall Prevention in the Home Falls can cause injuries. They can happen to people of all ages. There are many things you can do to make your home safe and to help prevent falls. What can I do on the outside of my home?  Regularly fix the edges of walkways and driveways and fix any  cracks.  Remove anything that might make you trip as you walk through a door, such as a raised step or threshold.  Trim any bushes or trees on the path to your home.  Use bright outdoor lighting.  Clear any walking paths of anything that might make someone trip, such as rocks or tools.  Regularly check to see if handrails are loose or broken. Make sure that both sides of any steps have handrails.  Any raised decks and porches should have guardrails on the edges.  Have any leaves, snow, or ice cleared regularly.  Use sand or salt on walking paths during winter.  Clean up any spills in your garage right away. This includes oil or grease spills. What can I do in the bathroom?  Use night lights.  Install grab bars by the toilet and in the tub and shower. Do not use towel bars as grab bars.  Use non-skid mats or decals in the tub or shower.  If you need to sit down in the shower, use a plastic, non-slip stool.  Keep the floor dry. Clean up any water that spills on the floor as soon as it happens.  Remove soap buildup in the tub or shower regularly.  Attach bath mats securely with double-sided non-slip rug tape.  Do not have throw rugs and other things on the floor that can make you trip. What can I do in the bedroom?  Use night lights.  Make sure that you have a light by your bed that is easy to reach.  Do not use any sheets or blankets that are too big for your bed. They should not hang down onto the floor.  Have a firm chair that has side arms. You can use this for support while you get dressed.  Do not have throw rugs and other things on the floor that can make you trip. What can I do in the kitchen?  Clean up any spills right away.  Avoid walking on wet floors.  Keep items that you use a lot in easy-to-reach places.  If you need to reach something above you, use a strong step stool that has a grab bar.  Keep electrical cords out of the way.  Do not use floor  polish or wax that makes floors slippery. If you must use wax, use non-skid floor wax.  Do not have throw rugs and other things on the floor that can make you trip. What can I do with my stairs?  Do not leave any items on the stairs.  Make sure that there are handrails on both sides of the stairs and use them. Fix handrails that are broken or loose. Make sure that handrails are as long as the stairways.  Check any carpeting to make sure that it is firmly attached to the stairs. Fix any carpet that is loose or worn.  Avoid having throw rugs at the top or bottom of the stairs. If you do have throw rugs, attach them to the floor with carpet tape.  Make sure that you have a light switch at the top of the stairs and the bottom of the stairs. If you do not have them, ask someone to add them for you. What else can I do to help prevent falls?  Wear shoes that: ? Do not have high heels. ? Have rubber bottoms. ? Are comfortable and fit you well. ? Are closed at the toe. Do not wear sandals.  If you use a stepladder: ? Make sure that it is fully opened. Do not climb a closed stepladder. ? Make sure that both sides of the stepladder are locked into place. ? Ask someone to hold it for you, if possible.  Clearly mark and make sure that you can see: ? Any grab bars or handrails. ? First and last steps. ? Where the edge of each step is.  Use tools that help you move around (mobility aids) if they are needed. These include: ? Canes. ? Walkers. ? Scooters. ? Crutches.  Turn on the lights when you go into a dark area. Replace any light bulbs as soon as they burn out.  Set up your furniture so you have a clear path. Avoid moving your furniture around.  If any of your floors are uneven, fix them.  If there are any pets around you, be aware of where they are.  Review your medicines with your doctor. Some medicines can make you feel dizzy. This can increase your chance of falling. Ask your  doctor what other things that you can do to help prevent falls. This information is not intended to replace advice given to you by your health care provider. Make sure you discuss any questions you have with your health care provider. Document Released: 09/10/2009 Document Revised: 04/21/2016 Document Reviewed: 12/19/2014 Elsevier Interactive Patient Education  2018 [place] Maintenance, Female Adopting a healthy lifestyle and getting preventive care  can go a long way to promote health and wellness. Talk with your health care provider about what schedule of regular examinations is right for you. This is a good chance for you to check in with your provider about disease prevention and staying healthy. In between checkups, there are plenty of things you can do on your own. Experts have done a lot of research about which lifestyle changes and preventive measures are most likely to keep you healthy. Ask your health care provider for more information. Weight and diet Eat a healthy diet  Be sure to include plenty of vegetables, fruits, low-fat dairy products, and lean protein.  Do not eat a lot of foods high in solid fats, added sugars, or salt.  Get regular exercise. This is one of the most important things you can do for your health. ? Most adults should exercise for at least 150 minutes each week. The exercise should increase your heart rate and make you sweat (moderate-intensity exercise). ? Most adults should also do strengthening exercises at least twice a week. This is in addition to the moderate-intensity exercise.  Maintain a healthy weight  Body mass index (BMI) is a measurement that can be used to identify possible weight problems. It estimates body fat based on height and weight. Your health care provider can help determine your BMI and help you achieve or maintain a healthy weight.  For females 63 years of age and older: ? A BMI below 18.5 is considered underweight. ? A  BMI of 18.5 to 24.9 is normal. ? A BMI of 25 to 29.9 is considered overweight. ? A BMI of 30 and above is considered obese.  Watch levels of cholesterol and blood lipids  You should start having your blood tested for lipids and cholesterol at 74 years of age, then have this test every 5 years.  You may need to have your cholesterol levels checked more often if: ? Your lipid or cholesterol levels are high. ? You are older than 74 years of age. ? You are at high risk for heart disease.  Cancer screening Lung Cancer  Lung cancer screening is recommended for adults 45-63 years old who are at high risk for lung cancer because of a history of smoking.  A yearly low-dose CT scan of the lungs is recommended for people who: ? Currently smoke. ? Have quit within the past 15 years. ? Have at least a 30-pack-year history of smoking. A pack year is smoking an average of one pack of cigarettes a day for 1 year.  Yearly screening should continue until it has been 15 years since you quit.  Yearly screening should stop if you develop a health problem that would prevent you from having lung cancer treatment.  Breast Cancer  Practice breast self-awareness. This means understanding how your breasts normally appear and feel.  It also means doing regular breast self-exams. Let your health care provider know about any changes, no matter how small.  If you are in your 20s or 30s, you should have a clinical breast exam (CBE) by a health care provider every 1-3 years as part of a regular health exam.  If you are 71 or older, have a CBE every year. Also consider having a breast X-ray (mammogram) every year.  If you have a family history of breast cancer, talk to your health care provider about genetic screening.  If you are at high risk for breast cancer, talk to your health care provider about having  an MRI and a mammogram every year.  Breast cancer gene (BRCA) assessment is recommended for women who  have family members with BRCA-related cancers. BRCA-related cancers include: ? Breast. ? Ovarian. ? Tubal. ? Peritoneal cancers.  Results of the assessment will determine the need for genetic counseling and BRCA1 and BRCA2 testing.  Cervical Cancer Your health care provider may recommend that you be screened regularly for cancer of the pelvic organs (ovaries, uterus, and vagina). This screening involves a pelvic examination, including checking for microscopic changes to the surface of your cervix (Pap test). You may be encouraged to have this screening done every 3 years, beginning at age 2.  For women ages 22-65, health care providers may recommend pelvic exams and Pap testing every 3 years, or they may recommend the Pap and pelvic exam, combined with testing for human papilloma virus (HPV), every 5 years. Some types of HPV increase your risk of cervical cancer. Testing for HPV may also be done on women of any age with unclear Pap test results.  Other health care providers may not recommend any screening for nonpregnant women who are considered low risk for pelvic cancer and who do not have symptoms. Ask your health care provider if a screening pelvic exam is right for you.  If you have had past treatment for cervical cancer or a condition that could lead to cancer, you need Pap tests and screening for cancer for at least 20 years after your treatment. If Pap tests have been discontinued, your risk factors (such as having a new sexual partner) need to be reassessed to determine if screening should resume. Some women have medical problems that increase the chance of getting cervical cancer. In these cases, your health care provider may recommend more frequent screening and Pap tests.  Colorectal Cancer  This type of cancer can be detected and often prevented.  Routine colorectal cancer screening usually begins at 74 years of age and continues through 74 years of age.  Your health care  provider may recommend screening at an earlier age if you have risk factors for colon cancer.  Your health care provider may also recommend using home test kits to check for hidden blood in the stool.  A small camera at the end of a tube can be used to examine your colon directly (sigmoidoscopy or colonoscopy). This is done to check for the earliest forms of colorectal cancer.  Routine screening usually begins at age 62.  Direct examination of the colon should be repeated every 5-10 years through 74 years of age. However, you may need to be screened more often if early forms of precancerous polyps or small growths are found.  Skin Cancer  Check your skin from head to toe regularly.  Tell your health care provider about any new moles or changes in moles, especially if there is a change in a mole's shape or color.  Also tell your health care provider if you have a mole that is larger than the size of a pencil eraser.  Always use sunscreen. Apply sunscreen liberally and repeatedly throughout the day.  Protect yourself by wearing long sleeves, pants, a wide-brimmed hat, and sunglasses whenever you are outside.  Heart disease, diabetes, and high blood pressure  High blood pressure causes heart disease and increases the risk of stroke. High blood pressure is more likely to develop in: ? People who have blood pressure in the high end of the normal range (130-139/85-89 mm Hg). ? People who are overweight  or obese. ? People who are African American.  If you are 58-2 years of age, have your blood pressure checked every 3-5 years. If you are 55 years of age or older, have your blood pressure checked every year. You should have your blood pressure measured twice-once when you are at a hospital or clinic, and once when you are not at a hospital or clinic. Record the average of the two measurements. To check your blood pressure when you are not at a hospital or clinic, you can use: ? An automated  blood pressure machine at a pharmacy. ? A home blood pressure monitor.  If you are between 48 years and 83 years old, ask your health care provider if you should take aspirin to prevent strokes.  Have regular diabetes screenings. This involves taking a blood sample to check your fasting blood sugar level. ? If you are at a normal weight and have a low risk for diabetes, have this test once every three years after 74 years of age. ? If you are overweight and have a high risk for diabetes, consider being tested at a younger age or more often. Preventing infection Hepatitis B  If you have a higher risk for hepatitis B, you should be screened for this virus. You are considered at high risk for hepatitis B if: ? You were born in a country where hepatitis B is common. Ask your health care provider which countries are considered high risk. ? Your parents were born in a high-risk country, and you have not been immunized against hepatitis B (hepatitis B vaccine). ? You have HIV or AIDS. ? You use needles to inject street drugs. ? You live with someone who has hepatitis B. ? You have had sex with someone who has hepatitis B. ? You get hemodialysis treatment. ? You take certain medicines for conditions, including cancer, organ transplantation, and autoimmune conditions.  Hepatitis C  Blood testing is recommended for: ? Everyone born from [birth year] through 1965. ? Anyone with known risk factors for hepatitis C.  Sexually transmitted infections (STIs)  You should be screened for sexually transmitted infections (STIs) including gonorrhea and chlamydia if: ? You are sexually active and are younger than 74 years of age. ? You are older than 74 years of age and your health care provider tells you that you are at risk for this type of infection. ? Your sexual activity has changed since you were last screened and you are at an increased risk for chlamydia or gonorrhea. Ask your health care provider if you  are at risk.  If you do not have HIV, but are at risk, it may be recommended that you take a prescription medicine daily to prevent HIV infection. This is called pre-exposure prophylaxis (PrEP). You are considered at risk if: ? You are sexually active and do not regularly use condoms or know the HIV status of your partner(s). ? You take drugs by injection. ? You are sexually active with a partner who has HIV.  Talk with your health care provider about whether you are at high risk of being infected with HIV. If you choose to begin PrEP, you should first be tested for HIV. You should then be tested every 3 months for as long as you are taking PrEP. Pregnancy  If you are premenopausal and you may become pregnant, ask your health care provider about preconception counseling.  If you may become pregnant, take 400 to 800 micrograms (mcg) of folic acid every  day.  If you want to prevent pregnancy, talk to your health care provider about birth control (contraception). Osteoporosis and menopause  Osteoporosis is a disease in which the bones lose minerals and strength with aging. This can result in serious bone fractures. Your risk for osteoporosis can be identified using a bone density scan.  If you are 36 years of age or older, or if you are at risk for osteoporosis and fractures, ask your health care provider if you should be screened.  Ask your health care provider whether you should take a calcium or vitamin D supplement to lower your risk for osteoporosis.  Menopause may have certain physical symptoms and risks.  Hormone replacement therapy may reduce some of these symptoms and risks. Talk to your health care provider about whether hormone replacement therapy is right for you. Follow these instructions at home:  Schedule regular health, dental, and eye exams.  Stay current with your immunizations.  Do not use any tobacco products including cigarettes, chewing tobacco, or electronic  cigarettes.  If you are pregnant, do not drink alcohol.  If you are breastfeeding, limit how much and how often you drink alcohol.  Limit alcohol intake to no more than 1 drink per day for nonpregnant women. One drink equals 12 ounces of beer, 5 ounces of wine, or 1 ounces of hard liquor.  Do not use street drugs.  Do not share needles.  Ask your health care provider for help if you need support or information about quitting drugs.  Tell your health care provider if you often feel depressed.  Tell your health care provider if you have ever been abused or do not feel safe at home. This information is not intended to replace advice given to you by your health care provider. Make sure you discuss any questions you have with your health care provider. Document Released: 05/30/2011 Document Revised: 04/21/2016 Document Reviewed: 08/18/2015 Elsevier Interactive Patient Education  [employer].

## 2018-06-22 NOTE — Addendum Note (Signed)
Addended by: Marin Olp on: 06/22/2018 03:14 PM   Modules accepted: Orders

## 2018-07-03 ENCOUNTER — Telehealth: Payer: Self-pay | Admitting: Family Medicine

## 2018-07-03 NOTE — Telephone Encounter (Signed)
Copied from [place] [id number]. Topic: Quick Communication - Rx Refill/Question >> Jul 03, 2018  9:18 AM Cecelia Byars, NT wrote: Medication: scopolamine (TRANSDERM-SCOP) 1 MG/3DAYS  Has the patient contacted their pharmacy? yes (Agent: If no, request that the patient contact the pharmacy for the refill. (Agent: If yes, when and what did the pharmacy advise? Pharmacy called and said there needs to be a prior authorization for the above medication  Preferred Pharmacy (with phone number or street name [id number] - [place], [place] - [address] [phone] (Phone) [phone] (Fax)      Agent: Please be advised that RX refills may take up to 3 business days. We ask that you follow-up with your pharmacy.

## 2018-07-05 NOTE — Telephone Encounter (Signed)
Called and spoke with patient who is going on a cruise. She wanted them so she doesn't get sick on the cruise

## 2018-07-10 NOTE — Telephone Encounter (Signed)
Rx was sent to pharmacy on 7/26.

## 2018-07-16 ENCOUNTER — Ambulatory Visit: Payer: Medicare Other | Admitting: Family Medicine

## 2018-07-16 ENCOUNTER — Encounter: Payer: Self-pay | Admitting: Family Medicine

## 2018-07-16 VITALS — BP 128/84 | HR 78 | Temp 97.9°F | Ht 66.5 in | Wt 138.8 lb

## 2018-07-16 DIAGNOSIS — K219 Gastro-esophageal reflux disease without esophagitis: Secondary | ICD-10-CM

## 2018-07-16 DIAGNOSIS — G629 Polyneuropathy, unspecified: Secondary | ICD-10-CM

## 2018-07-16 LAB — TSH: TSH: 1.05 u[IU]/mL (ref 0.35–4.50)

## 2018-07-16 LAB — HEMOGLOBIN A1C: Hgb A1c MFr Bld: 5.8 % (ref 4.6–6.5)

## 2018-07-16 LAB — VITAMIN B12: Vitamin B-12: 312 pg/mL (ref 211–911)

## 2018-07-16 NOTE — Progress Notes (Signed)
Please see pt's response and questions.

## 2018-07-16 NOTE — Assessment & Plan Note (Addendum)
S: Has had years of issues - gets change in sensation over her legs particularly over the thighs when she puts pants on. Can feel like soreness sensation- worse with tight pants. Occasionally gets numbness into the toes.  Occasionally gets shooting pain into her right leg. she thinks the sensation in thighs has been slightly worse lately.    She had been having issues with knees in past and was referred to Dr. Ouida Sills of [facility] rheumatology. She brings notes from 2012 from him which show bilatearl leg numbness, weakness, cramping since aug 2011. From study reporty "this is a normal study without electrophysiologic evidence of lumbar radiculopathy, plexopathy, polyneuropathy, or myopathy". She also saw Dr. Jannifer Franklin of neurology and on 05/02/11 had MRI of lumbar spine which showed "mild disc degeneration and spondylitic changes without significant compression".   Also had fall in 2007 after which pint she was diagnosed by mild carpal tunnel bilaterally by Dr. Amedeo Plenty  At CPE in January patient mentioned- Patient has a history of some neck tension and pressure- wondered if doing water walking with weights overhead contributed. Legs not worse with this. She has cut down on using the weights.   A/P: sounds like 2 separate issues- carpal tunnel which is mild and peripheral neuropathy- suspect this is idiopathic but we will check b12, tsh, a1c today. She has had other extensive workup so will hold off on repeating that for now. b12 at risk for being low with long term PPI

## 2018-07-16 NOTE — Progress Notes (Signed)
Subjective:  Dana Chambers is a 72 y.o. year old very pleasant female patient who presents for/with See problem oriented charting ROS- reflux when on low dose PPI- burning sensation, no edema. Has change in sensation in thighs. No recent falls.    Past Medical History-  Patient Active Problem List   Diagnosis Date Noted  . Rib pain on left side 03/21/2017    Priority: Medium  . Scoliosis 03/21/2017    Priority: Medium  . Osteoporosis 06/29/2009    Priority: Medium  . 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    Priority: Medium  . GERD 05/01/1999    Priority: Medium  . Hearing loss associated with syndrome of both ears 12/05/2016    Priority: Low  . Neuropathy 03/06/2012    Priority: Low  . COLONIC POLYPS 05/25/2006    Priority: Low    Medications- reviewed and updated Current Outpatient Medications  Medication Sig Dispense Refill  . Cholecalciferol (VITAMIN D PO) Take by mouth.    . denosumab (PROLIA) 60 MG/ML SOLN injection Inject 60 mg into the skin every 6 (six) months. Administer in upper arm, thigh, or abdomen    . esomeprazole (NEXIUM) 40 MG capsule TAKE ONE CAPSULE BY MOUTH EVERY DAY AT NOON 90 capsule 3  . Multiple Vitamin (MULTIVITAMIN) tablet Take 1 tablet by mouth daily.     Marland Kitchen scopolamine (TRANSDERM-SCOP) 1 MG/3DAYS Place 1 patch (1.5 mg total) onto the skin every 3 (three) days. 10 patch 0   No current facility-administered medications for this visit.     Objective: BP 128/84 (BP Location: Left Arm, Patient Position: Sitting, Cuff Size: Large)   Pulse 78   Temp 97.9 F (36.6 C) (Oral)   Ht 5' 6.5" (1.689 m)   Wt 138 lb 12.8 oz (63 kg)   SpO2 99%   BMI 22.07 kg/m  Gen: NAD, resting comfortably CV: RRR  Lungs: nonlabored, normal respiratory rate Abdomen: soft/nondistended Ext: no edema Skin: warm, dry Neuro: grossly normal, moves all extremities, normal gait and leg strength  Assessment/Plan:  Neuropathy S: Has had years of issues - gets  change in sensation over her legs particularly over the thighs when she puts pants on. Can feel like soreness sensation- worse with tight pants. Occasionally gets numbness into the toes.  Occasionally gets shooting pain into her right leg. she thinks the sensation in thighs has been slightly worse lately.    She had been having issues with knees in past and was referred to Dr. Ouida Sills of [facility] rheumatology. She brings notes from 2012 from him which show bilatearl leg numbness, weakness, cramping since aug 2011. From study reporty "this is a normal study without electrophysiologic evidence of lumbar radiculopathy, plexopathy, polyneuropathy, or myopathy". She also saw Dr. Jannifer Franklin of neurology and on 05/02/11 had MRI of lumbar spine which showed "mild disc degeneration and spondylitic changes without significant compression".   Also had fall in 2007 after which pint she was diagnosed by mild carpal tunnel bilaterally by Dr. Amedeo Plenty  At CPE in January patient mentioned- Patient has a history of some neck tension and pressure- wondered if doing water walking with weights overhead contributed. Legs not worse with this. She has cut down on using the weights.   A/P: sounds like 2 separate issues- carpal tunnel which is mild and peripheral neuropathy- suspect this is idiopathic but we will check b12, tsh, a1c today. She has had other extensive workup so will hold off on repeating that for now. b12  at risk for being low with long term PPI  GERD S: she tried 20mg  OTC dose nexium and not effective- back on 40mg  A/P: continue current meds, check b12 given long term PPI  Future Appointments  Date Time Provider [place]  08/13/2018  9:00 AM Philemon Kingdom, MD [facility] None  09/13/2018  9:00 AM Fontaine, Belinda Block, MD [facility] Mariane Baumgarten  12/12/2018  8:45 AM Marin Olp, MD [facility] PEC  06/27/2019 10:00 AM [facility] HEALTH COACH [facility]   Lab/Order associations: Neuropathy - Plan: Vitamin B12, TSH,  Hemoglobin A1c  Gastroesophageal reflux disease, esophagitis presence not specified  Return precautions advised.  Garret Reddish, MD

## 2018-07-16 NOTE — Patient Instructions (Addendum)
Health Maintenance Due  Topic Date Due  . INFLUENZA VACCINE -schedule for this Fall 06/28/2018   I do suspect this is a mild form of neuropathy. Often this is progressive. There are medicine options if this gets really bothersome for you- at this point I dont think this makes sense to start  Please stop by lab before you go

## 2018-07-16 NOTE — Assessment & Plan Note (Signed)
S: she tried 20mg  OTC dose nexium and not effective- back on 40mg  A/P: continue current meds, check b12 given long term PPI

## 2018-08-07 ENCOUNTER — Other Ambulatory Visit: Payer: Self-pay | Admitting: Gynecology

## 2018-08-13 ENCOUNTER — Ambulatory Visit: Payer: Medicare Other | Admitting: Internal Medicine

## 2018-08-13 ENCOUNTER — Encounter: Payer: Self-pay | Admitting: Internal Medicine

## 2018-08-13 VITALS — BP 118/70 | HR 86 | Ht 66.5 in | Wt 139.0 lb

## 2018-08-13 DIAGNOSIS — E559 Vitamin D deficiency, unspecified: Secondary | ICD-10-CM | POA: Diagnosis not present

## 2018-08-13 DIAGNOSIS — R7309 Other abnormal glucose: Secondary | ICD-10-CM

## 2018-08-13 DIAGNOSIS — E875 Hyperkalemia: Secondary | ICD-10-CM

## 2018-08-13 DIAGNOSIS — M818 Other osteoporosis without current pathological fracture: Secondary | ICD-10-CM | POA: Diagnosis not present

## 2018-08-13 LAB — VITAMIN D 25 HYDROXY (VIT D DEFICIENCY, FRACTURES): VITD: 40.81 ng/mL (ref 30.00–100.00)

## 2018-08-13 NOTE — Progress Notes (Signed)
Patient ID: Dana Chambers, female   DOB: 1946/03/02, 72 y.o.   MRN: [id number]   HPI  Dana Chambers is a 72 y.o.-year-old female, initially referred by Dr. Phineas Real, for management of osteoporosis.  Last visit a year ago.  She was recently dx'ed with neuropathy - mostly in L leg.  She started B12 supplements since last visit.  She also had a high HbA1c of 5.8%.    Pt was dx with OP in 2006.  I reviewed patient's DXA scan reports: Date L1-L3 T score FN T score 33% distal Radius Ultra distal radius   10/12/2016 ([facility])  -1.4 (-2%)  RFN: -2.6 LFN: -2.4  -2.1 (-2.9%)  n/a  10/12/2015 ([facility])  -1.3 (+7.1%*)  RFN: -2.6 LFN: -2.7 -1.8  n/a  10/09/2013 ([facility])  -1.8  RFN: -2.5 LFN: -2.2 -3.0 (-14.9%*)  -4.5   09/07/2011 ([facility])  n/a RFN: -2.2   -3.1 (+3.0%)   08/26/2009 ([facility])  -1.1  RFN: -1.7 LFN: -1.9     No fractures.  She fell backwards (slipped) x1 >> on coccyx + hands >> no fxs, but developed traumatic carpal tunnel.  No dizziness/vertigo/orthostasis/poor vision.  Previous osteoporosis treatments: - Bisphosphonates (Fosamax, Boniva) for approximately 10 years, followed by drug holiday - Prolia x 5 inj now - last 04/2018 (she had 4 injections up to 05/2017)  She had one instance of vitamin D insufficiency but last level was normal.  Reviewed available vit D levels: Lab Results  Component Value Date   VD25OH 37.58 12/06/2017   VD25OH 26.10 (L) 08/10/2017   VD25OH 50 07/12/2011   VD25OH 55 06/28/2010   VD25OH 45 05/18/2009   VD25OH 37 10/21/2008   VD25OH 36 04/04/2008   She is not on calcium but we started 2000 units vitamin D daily at last visit.   She does weight bearing exercises -for the last 16 years: [employer] - walking 45-60 min twice a week, but not using weights anymore due to upper back pain  Menopause was at 72 years old.  Pt does have a FH of osteoporosis: In mother, who also had dowagers hump. Mother's aunt also had this.  No  history of hyper or hypocalcemia or hyperparathyroidism.  She has a history of kidney stones and had lithotripsy x2.  She has occasional pain in left flank Lab Results  Component Value Date   PTH 48 08/10/2017   PTH Comment 08/10/2017   CALCIUM 9.3 12/06/2017   CALCIUM 9.4 08/10/2017   CALCIUM 9.2 11/29/2016   CALCIUM 9.3 11/12/2015   CALCIUM 9.0 10/02/2014   CALCIUM 9.0 09/02/2013   CALCIUM 9.5 09/02/2013   CALCIUM 9.8 08/23/2012   CALCIUM 10.0 08/01/2012   CALCIUM 9.7 02/02/2012   No history of thyrotoxicosis.  Reviewed TSH levels: Lab Results  Component Value Date   TSH 1.05 07/16/2018   TSH 1.05 11/29/2016   TSH 1.25 11/12/2015   TSH 1.11 10/02/2014   TSH 1.01 09/02/2013   TSH 1.08 09/02/2013   No CKD. Last BUN/Cr: Lab Results  Component Value Date   BUN 17 12/06/2017   CREATININE 0.82 12/06/2017   She had BrCA 2006, s/p RxTx, no ChTx >> she was on tamoxifen for 4.5 years.  She continues on Nexium delayed release 40 mg daily.  She will go in a [place] cruise.  ROS: Constitutional: no weight gain/no weight loss, no fatigue, no subjective hyperthermia, no subjective hypothermia Eyes: no blurry vision, no xerophthalmia ENT: no sore throat, no nodules palpated in throat, no  dysphagia, no odynophagia, no hoarseness Cardiovascular: no CP/no SOB/+ palpitations/no leg swelling Respiratory: no cough/no SOB/no wheezing Gastrointestinal: no N/no V/no D/no C/+ acid reflux Musculoskeletal: + Muscle aches/no joint aches Skin: no rashes, no hair loss Neurological: no tremors/no numbness/no tingling/no dizziness  I reviewed pt's medications, allergies, PMH, social hx, family hx, and changes were documented in the history of present illness. Otherwise, unchanged from my initial visit note.  Past Medical History:  Diagnosis Date  . Basal cell carcinoma of skin    left ankle, right thigh  . Bursitis    right hip  . Cancer [facility]) 2006   Breast/right  . Diverticulosis    . GERD (gastroesophageal reflux disease)   . History of breast cancer/33 radiation treatments/ no chemo    2006  S/P RIGHT LUMPECTOMY AND RADIATION FOLLOWED BY DR RUBIN--  NO RECURRENCE  . History of kidney stones   . Hyperplastic colon polyp   . IBS (irritable bowel syndrome)   . Idiopathic peripheral neuropathy    bilateral tingling/ due to fall in [place]  . Internal hemorrhoid   . Kidney stone   . Leiomyoma    Multiple small  . Liver cyst 2006   Benign  . OA (osteoarthritis)   . Osteoporosis 09/2015   T score -2.7  . Raynaud disease   . Renal calculus, left    non obstructive  . Rib pain on left side    able to lay on left side  . Rosacea   . Tinnitus    Past Surgical History:  Procedure Laterality Date  . BREAST BIOPSY Left 2016   benign  . BREAST EXCISIONAL BIOPSY Right 1973   benign  . BREAST LUMPECTOMY  2006    Right -- S/P RADIATION--  NO RECURRENCE  . DILATION AND CURETTAGE OF UTERUS    . EXTRACORPOREAL SHOCK WAVE LITHOTRIPSY     X2  . HYSTEROSCOPY    . HYSTEROSCOPY W/D&C  08/03/2012   Procedure: DILATATION AND CURETTAGE /HYSTEROSCOPY;  Surgeon: Anastasio Auerbach, MD;  Location: [facility];  Service: Gynecology;  Laterality: N/A;  . SUPRANUMERARY NIPPLE EXCISION  1973   CYST  . TONSILLECTOMY  AGE 69   Social History   Social History  . Marital status: Married    Spouse name: N/A  . Number of children: 1   Occupational History  . Retired- Psychologist, prison and probation services Also, used to sing >> now hoarseness   Social History Main Topics  . Smoking status: Never Smoker  . Smokeless tobacco: Never Used  . Alcohol use 3.6 oz/week    6 Standard drinks or equivalent per week  . Drug use: No   Social History Narrative   Married (husband patient of Dr. Yong Channel). 1 daughter. 1 granddaughter.       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       Hobbies: reading, travel, time with granddaughter. Very active   Current Outpatient Medications on File Prior to  Visit  Medication Sig Dispense Refill  . Cholecalciferol (VITAMIN D PO) Take by mouth.    . denosumab (PROLIA) 60 MG/ML SOLN injection Inject 60 mg into the skin every 6 (six) months. Administer in upper arm, thigh, or abdomen    . esomeprazole (NEXIUM) 40 MG capsule TAKE ONE CAPSULE BY MOUTH EVERY DAY AT NOON 90 capsule 3  . Multiple Vitamin (MULTIVITAMIN) tablet Take 1 tablet by mouth daily.     Marland Kitchen scopolamine (TRANSDERM-SCOP) 1 MG/3DAYS Place 1 patch (1.5 mg total) onto  the skin every 3 (three) days. 10 patch 0   No current facility-administered medications on file prior to visit.    Allergies  Allergen Reactions  . Amoxicillin Diarrhea    GI upset   Family History  Problem Relation Age of Onset  . Dementia Mother   . Depression Mother   . Osteoporosis Mother   . Stroke Father   . Heart attack Father 13  . Rectal cancer Maternal Grandmother   . Colon cancer Maternal Grandmother   . Stroke Sister        TIA. ? dementia  . Dementia Sister     PE: BP 118/70   Pulse 86   Ht 5' 6.5" (1.689 m)   Wt 139 lb (63 kg)   SpO2 97%   BMI 22.10 kg/m  Wt Readings from Last 3 Encounters:  08/13/18 139 lb (63 kg)  07/16/18 138 lb 12.8 oz (63 kg)  06/22/18 142 lb 4 oz (64.5 kg)   Constitutional: normal weight, in NAD Eyes: PERRLA, EOMI, no exophthalmos ENT: moist mucous membranes, no thyromegaly, no cervical lymphadenopathy Cardiovascular: RRR, No MRG Respiratory: CTA B Gastrointestinal: abdomen soft, NT, ND, BS+ Musculoskeletal: no deformities, strength intact in all 4 Skin: moist, warm, no rashes Neurological: no tremor with outstretched hands, DTR normal in all 4  Assessment: 1. Osteoporosis Lancet Diabetes Endocrinol. 2017 Jul;5(7):513-523. doi: 10.1016/S2213-8587(17)30138-9. Epub 2017 May 22.  10 years of denosumab treatment in postmenopausal women with osteoporosis: results from the phase 3 randomised FREEDOM trial and open-label extension. Bone HG et al.  Denosumab  treatment for up to 10 years was associated with low rates of adverse events, low fracture incidence compared with that observed during the original trial, and continued increases in BMD without plateau.  - she denies fractures but she has a decrease in height from 5 ft 8 in to 5 ft 6.5 in over the years  2.  Vitamin D insufficiency  3.  Elevated HbA1c  Plan: 1. Osteoporosis -Likely age related/postmenopausal, she has family history of osteoporosis -She continues to be fracture free -Again discussed fall precautions. -She continues to exercise consistently -Reviewed her latest bone mineral density from 2017, and the scores were stable compared to the one from the previous year.  We cannot compare directly the scores from 2016 with the ones from 2014 as they were checked on different machines. -She tolerates Prolia well (she already had 5 injections -she skipped 1 dose when she was transitioning the injections from Dr. Zelphia Cairo office to our office).  No side effects from Prolia.  No thigh/hip pain/jaw pain.  No dental work in progress or planned.  At last visit, we discussed about the Freedom trial extension study:10 years of denosumab was saved and protective against fractures I would recommend to continue for at least 6 years but we can also go up to 10 years.  We discussed at last visit about teriparatide and abaloparatide also.  I would not recommend this for now.  We did discuss that when she comes off Prolia, will need another course of bisphosphonates, most likely. - discussed that drug holidays are not indicated with Prolia tx as she may lose all the benefits of the tx if she stops abruptly - at the end of the tx we can give her a Reclast infusion or 1 year of po Bisphosphonates (second line, as she has GERD) -Today we will check a BMP and a vitamin D level.  At last visit, we checked a PTH level  as her 33% distal radius T score was lower compared to the rest of her T-scores on the latest  DEXA scan.  However, PTH was normal then.  Will not repeat this today -If labs are normal, will continue with Prolia injections -We will check another DEXA scan after 10/12/2018, 2 years from the previous -I explained that the first indication that the treatment is working is her not having fractures.  DXA scan changes are secondary: Unchanged or slightly higher T scores are not desirable -I will see the patient back in a year  2.  Vitamin D insufficiency -Vitamin D was slightly low at last visit. -We started 2000 units vitamin D daily -Latest level was normal -We will recheck another level today  3.  Elevated HbA1c -In the prediabetic range. -Discussed about reducing the fat in her diet to improve her insulin resistance.  She does also have LDL, at 175.  Strongly advised her to cut down cheese, eggs, bacon, meat  Component     Latest Ref Rng & Units 08/13/2018          Glucose     65 - 99 mg/dL 100 (H)  BUN     7 - 25 mg/dL 18  Creatinine     0.60 - 0.93 mg/dL 0.69  GFR, Est Non African American     > OR = 60 mL/min/1.51m 86  GFR, Est African American     > OR = 60 mL/min/1.755m100  BUN/Creatinine Ratio     6 - 22 (calc) NOT APPLICABLE  Sodium     13041 146 mmol/L 147 (H)  Potassium     3.5 - 5.3 mmol/L 5.6 (H)  Chloride     98 - 110 mmol/L 105  CO2     20 - 32 mmol/L 27  Calcium     8.6 - 10.4 mg/dL 9.8  VITD     30.00 - 100.00 ng/mL 40.81   Sodium slightly high, as is her glucose.   Vitamin D and calcium are normal. Potassium is high.  I do not see on her medication list any medicines with hyperkalemia as side effect.  We will advise her to stay well-hydrated I will have her back for another potassium level in 3 days.  CrPhilemon KingdomMD PhD [facility]

## 2018-08-13 NOTE — Patient Instructions (Signed)
Please continue vitamin D 2000 units daily.  Please stop at the lab.  Please call me in 2 months to order the new bone density scan.  HAVE FUN ON YOUR TRIP!  Please come back for a follow-up appointment in 1 year.

## 2018-08-14 LAB — BASIC METABOLIC PANEL WITH GFR
BUN: 18 mg/dL (ref 7–25)
CO2: 27 mmol/L (ref 20–32)
Calcium: 9.8 mg/dL (ref 8.6–10.4)
Chloride: 105 mmol/L (ref 98–110)
Creat: 0.69 mg/dL (ref 0.60–0.93)
GFR, Est African American: 100 mL/min/{1.73_m2} (ref >=60)
GFR, Est Non African American: 86 mL/min/{1.73_m2} (ref >=60)
Glucose, Bld: 100 mg/dL — ABNORMAL HIGH (ref 65–99)
Potassium: 5.6 mmol/L — ABNORMAL HIGH (ref 3.5–5.3)
Sodium: 147 mmol/L — ABNORMAL HIGH (ref 135–146)

## 2018-08-15 ENCOUNTER — Encounter: Payer: Self-pay | Admitting: Internal Medicine

## 2018-08-15 ENCOUNTER — Other Ambulatory Visit: Payer: Self-pay

## 2018-08-17 ENCOUNTER — Other Ambulatory Visit (INDEPENDENT_AMBULATORY_CARE_PROVIDER_SITE_OTHER): Payer: Medicare Other

## 2018-08-17 DIAGNOSIS — E875 Hyperkalemia: Secondary | ICD-10-CM | POA: Diagnosis not present

## 2018-08-17 LAB — POTASSIUM: Potassium: 4.4 mEq/L (ref 3.5–5.1)

## 2018-09-13 ENCOUNTER — Telehealth: Payer: Self-pay | Admitting: *Deleted

## 2018-09-13 ENCOUNTER — Ambulatory Visit: Payer: Medicare Other | Admitting: Gynecology

## 2018-09-13 ENCOUNTER — Encounter: Payer: Self-pay | Admitting: Gynecology

## 2018-09-13 VITALS — BP 122/78 | Ht 67.5 in | Wt 142.0 lb

## 2018-09-13 DIAGNOSIS — N952 Postmenopausal atrophic vaginitis: Secondary | ICD-10-CM | POA: Diagnosis not present

## 2018-09-13 DIAGNOSIS — Z01419 Encounter for gynecological examination (general) (routine) without abnormal findings: Secondary | ICD-10-CM | POA: Diagnosis not present

## 2018-09-13 DIAGNOSIS — N644 Mastodynia: Secondary | ICD-10-CM | POA: Diagnosis not present

## 2018-09-13 DIAGNOSIS — Z853 Personal history of malignant neoplasm of breast: Secondary | ICD-10-CM

## 2018-09-13 DIAGNOSIS — M810 Age-related osteoporosis without current pathological fracture: Secondary | ICD-10-CM

## 2018-09-13 NOTE — Telephone Encounter (Signed)
-----   Message from Anastasio Auerbach, MD sent at 09/13/2018  9:38 AM EDT ----- Schedule diagnostic mammography and ultrasound at [facility].  Reference history of right breast cancer now with tenderness in the right tail of Spence region.  No palpable abnormalities on physician exam

## 2018-09-13 NOTE — Addendum Note (Signed)
Addended by: Nelva Nay on: 09/13/2018 10:43 AM   Modules accepted: Orders

## 2018-09-13 NOTE — Telephone Encounter (Signed)
Patient scheduled 09/14/18 @ 1:30pm at breast center, left detailed message on home #

## 2018-09-13 NOTE — Patient Instructions (Signed)
[facility] should call you to set up the diagnostic mammogram and ultrasound.  Follow-up in 1 year for annual exam.

## 2018-09-13 NOTE — Progress Notes (Signed)
    Dana Chambers 1946/06/10 [id number]        71 y.o.  G1P1001 for annual gynecologic exam.  Patient has noted some right breast tenderness on and off over the last several months.  No palpable abnormalities on self breast exam.  History of right breast cancer in the past.  Mammogram 08/2017 normal.  Past medical history,surgical history, problem list, medications, allergies, family history and social history were all reviewed and documented as reviewed in the EPIC chart.  ROS:  Performed with pertinent positives and negatives included in the history, assessment and plan.   Additional significant findings : None   Exam: [name] assistant Vitals:   09/13/18 0858  BP: 122/78  Weight: 142 lb (64.4 kg)  Height: 5' 7.5" (1.715 m)   Body mass index is 21.91 kg/m.  General appearance:  Normal affect, orientation and appearance. Skin: Grossly normal HEENT: Without gross lesions.  No cervical or supraclavicular adenopathy. Thyroid normal.  Lungs:  Clear without wheezing, rales or rhonchi Cardiac: RR, without RMG Abdominal:  Soft, nontender, without masses, guarding, rebound, organomegaly or hernia Breasts:  Examined lying and sitting left without masses, retractions, discharge or axillary adenopathy.  Right status post lumpectomy with nipple resection.  No palpable masses or axillary adenopathy.  Patient pointing to the tail of Spence region as far as tenderness.  Pelvic:  Ext, BUS, Vagina: With atrophic changes  Cervix: With atrophic changes  Uterus: Anteverted, normal size, shape and contour, midline and mobile nontender   Adnexa: Without masses or tenderness    Anus and perineum: Normal   Rectovaginal: Normal sphincter tone without palpated masses or tenderness.    Assessment/Plan:  72 y.o. G66P1001 female for annual gynecologic exam.   1. Right breast mastalgia.  History of right breast cancer.  No palpable abnormalities.  Recommend diagnostic mammography now and ultrasound  in this area. 2. Osteoporosis.  Receiving Prolia through Dr Cruzita Lederer.  She will follow-up with her in reference to this.  Also due for DEXA and she will arrange through [facility]. 3. Colonoscopy 2018.  Repeat at their recommended interval. 4. Pap smear 2016.  Pap smear done today.  No history of abnormal Pap smears.  Reviewed current screening guidelines.  Patient uncomfortable with stop screening. 5. Health maintenance.  No routine lab work done as patient does this elsewhere.  Follow-up 1 year, sooner as needed.   Anastasio Auerbach MD, 9:36 AM 09/13/2018

## 2018-09-14 ENCOUNTER — Ambulatory Visit
Admission: RE | Admit: 2018-09-14 | Discharge: 2018-09-14 | Disposition: A | Discharge status: Home / Self Care | Payer: Medicare Other | Source: Ambulatory Visit | Attending: Gynecology | Admitting: Gynecology

## 2018-09-14 DIAGNOSIS — N644 Mastodynia: Secondary | ICD-10-CM

## 2018-09-14 DIAGNOSIS — Z853 Personal history of malignant neoplasm of breast: Secondary | ICD-10-CM

## 2018-09-14 HISTORY — DX: Personal history of irradiation: Z92.3

## 2018-09-17 LAB — PAP IG W/ RFLX HPV ASCU

## 2018-10-17 ENCOUNTER — Encounter: Payer: Self-pay | Admitting: Internal Medicine

## 2018-10-18 ENCOUNTER — Encounter: Payer: Self-pay | Admitting: Gynecology

## 2018-11-05 ENCOUNTER — Telehealth: Payer: Self-pay

## 2018-11-05 ENCOUNTER — Telehealth: Payer: Self-pay | Admitting: Internal Medicine

## 2018-11-05 ENCOUNTER — Encounter: Payer: Self-pay | Admitting: Family Medicine

## 2018-11-05 ENCOUNTER — Telehealth: Payer: Self-pay | Admitting: *Deleted

## 2018-11-05 ENCOUNTER — Ambulatory Visit: Payer: Medicare Other | Admitting: Family Medicine

## 2018-11-05 VITALS — BP 122/68 | HR 93 | Temp 98.0°F | Ht 67.5 in | Wt 142.4 lb

## 2018-11-05 DIAGNOSIS — J040 Acute laryngitis: Secondary | ICD-10-CM | POA: Diagnosis not present

## 2018-11-05 DIAGNOSIS — R0982 Postnasal drip: Secondary | ICD-10-CM

## 2018-11-05 MED ORDER — BETAMETHASONE SOD PHOS & ACET 6 (3-3) MG/ML IJ SUSP
12.0000 mg | Freq: Once | INTRAMUSCULAR | Status: AC
  Administered 2018-11-05: 12 mg via INTRAMUSCULAR

## 2018-11-05 MED ORDER — FLUTICASONE PROPIONATE 50 MCG/ACT NA SUSP: 2.0000 | Freq: Every day | NASAL | 6 refills | Status: DC

## 2018-11-05 NOTE — Telephone Encounter (Signed)
See note and schedule patient.

## 2018-11-05 NOTE — Telephone Encounter (Signed)
I just saw the results now.  They look great: The left hip bone density is stable, the right hip bone density has increased, and the spine has increased quite significantly.  Overall, excellent results!  Agree with continuation of Prolia.

## 2018-11-05 NOTE — Telephone Encounter (Signed)
This encounter was closed before complication.  Please see myChart encounter from patient's GYN:  Vidya,   Your recent bone density is stable/improved over your prior study. I would continue on the Prolia.   Dr. Phineas Real    Last read by Rogene Houston at 10:27 AM on 10/26/2018.

## 2018-11-05 NOTE — Patient Instructions (Signed)
Cool mist humidifier at night steroid shot today to help laryngitis Vocal rest and fluids!    Postnasal Drip Postnasal drip is the feeling of mucus going down the back of your throat. Mucus is a slimy substance that moistens and cleans your nose and throat, as well as the air pockets in face bones near your forehead and cheeks (sinuses). Small amounts of mucus pass from your nose and sinuses down the back of your throat all the time. This is normal. When you produce too much mucus or the mucus gets too thick, you can feel it. Some common causes of postnasal drip include:  Having more mucus because of: ? A cold or the flu. ? Allergies. ? Cold air. ? Certain medicines.  Having more mucus that is thicker because of: ? A sinus or nasal infection. ? Dry air. ? A food allergy.  Follow these instructions at home: Relieving discomfort  Gargle with a salt-water mixture 3-4 times a day or as needed. To make a salt-water mixture, completely dissolve -1 tsp of salt in 1 cup of warm water.  If the air in your home is dry, use a humidifier to add moisture to the air.  Use a saline spray or container (neti pot) to flush out the nose (nasal irrigation). These methods can help clear away mucus and keep the nasal passages moist. General instructions  Take over-the-counter and prescription medicines only as told by your health care provider.  Follow instructions from your health care provider about eating or drinking restrictions. You may need to avoid caffeine.  Avoid things that you know you are allergic to (allergens), like dust, mold, pollen, pets, or certain foods.  Drink enough fluid to keep your urine pale yellow.  Keep all follow-up visits as told by your health care provider. This is important. Contact a health care provider if:  You have a fever.  You have a sore throat.  You have difficulty swallowing.  You have headache.  You have sinus pain.  You have a cough that does  not go away.  The mucus from your nose becomes thick and is green or yellow in color.  You have cold or flu symptoms that last more than 10 days. Summary  Postnasal drip is the feeling of mucus going down the back of your throat.  If your health care provider approves, use nasal irrigation or a nasal spray 2?4 times a day.  Avoid things that you know you are allergic to (allergens), like dust, mold, pollen, pets, or certain foods. This information is not intended to replace advice given to you by your health care provider. Make sure you discuss any questions you have with your health care provider. Document Released: 02/27/2017 Document Revised: 02/27/2017 Document Reviewed: 02/27/2017 Elsevier Interactive Patient Education  [employer].

## 2018-11-05 NOTE — Telephone Encounter (Signed)
Copied from [place] [id number]. Topic: Appointment Scheduling - Scheduling Inquiry for Clinic >> Nov 05, 2018  7:36 AM Scherrie Gerlach wrote: Reason for CRM: pt has a sore throat, congestion, and it has been going on 2 wks.  Requesting to see Dr Yong Channel today or tomorrow. (dr hunter not here tomorrow.) Please advise if ok to work in

## 2018-11-05 NOTE — Telephone Encounter (Signed)
Pt returned call. Informed of DEXA results and recommendation to continue to Prolia as ordered. Verbalized acceptance and understanding.

## 2018-11-05 NOTE — Telephone Encounter (Signed)
Patient would like to know if Dr Cruzita Lederer has seen the results from a bone density test that she had done.  She has not heard from our office about this

## 2018-11-05 NOTE — Progress Notes (Signed)
Patient: Dana Chambers MRN: [id number] DOB: [birth year] PCP: Marin Olp, MD     Subjective:  Chief Complaint  Patient presents with  . Cough  . Nasal Congestion    HPI: The patient is a 72 y.o. female who presents today for cough and congestion that started the day after thanksgiving. She lost her voice and had a scratchy throat. She also had some nasal drip and didn't really improve. She has been doing some night outings which likely hasn't helped. She continues to have a scratchy throat, maybe pressure in her nose and feels like her ears have something running around in them. She feels like it has been going on too long. She needs to get rid of the laryngitis by Sunday. No fever, but has had some chills. She has not taken anything over the counter in a while, but when it started she took some aleve. No sick contacts. No shortness of breath or wheezing. No n/v/d/   Review of Systems  Constitutional: Positive for chills and fatigue. Negative for fever.  HENT: Positive for congestion, ear pain, rhinorrhea, sinus pressure, sinus pain and sore throat. Negative for ear discharge and postnasal drip.   Respiratory: Negative for cough and shortness of breath.   Cardiovascular: Negative for chest pain.  Gastrointestinal: Negative for abdominal pain, nausea and vomiting.  Musculoskeletal: Negative for back pain.  Skin: Negative.   Neurological: Positive for headaches. Negative for dizziness.  Psychiatric/Behavioral: Negative for sleep disturbance.    Allergies Patient is allergic to amoxicillin.  Past Medical History Patient  has a past medical history of Basal cell carcinoma of skin, Bursitis, Cancer ([place]) (2006), Diverticulosis, GERD (gastroesophageal reflux disease), History of breast cancer/33 radiation treatments/ no chemo, History of kidney stones, Hyperplastic colon polyp, IBS (irritable bowel syndrome), Idiopathic peripheral neuropathy, Internal hemorrhoid, Kidney stone,  Leiomyoma, Liver cyst (2006), OA (osteoarthritis), Osteoporosis (2019), Personal history of radiation therapy, Raynaud disease, Renal calculus, left, Rib pain on left side, Rosacea, and Tinnitus.  Surgical History Patient  has a past surgical history that includes Supranumerary nipple excision (1973); Extracorporeal shock wave lithotripsy; Tonsillectomy (AGE 70); Hysteroscopy w/D&C (08/03/2012); Dilation and curettage of uterus; Hysteroscopy; Breast lumpectomy (2006 ); Breast biopsy (Left, 2016); and Breast excisional biopsy (Right, 1973).  Family History Pateint's family history includes Colon cancer in her maternal grandmother; Dementia in her mother and sister; Depression in her mother; Heart attack (age of onset: 36) in her father; Osteoporosis in her mother; Rectal cancer in her maternal grandmother; Stroke in her father and sister.  Social History Patient  reports that she has never smoked. She has never used smokeless tobacco. She reports that she drinks about 6.0 standard drinks of alcohol per week. She reports that she does not use drugs.    Objective: Vitals:   11/05/18 1446  BP: 122/68  Pulse: 93  Temp: 98 F (36.7 C)  TempSrc: Oral  SpO2: 100%  Weight: 142 lb 6.4 oz (64.6 kg)  Height: 5' 7.5" (1.715 m)    Body mass index is 21.97 kg/m.  Physical Exam  Constitutional: She appears well-developed and well-nourished.  HENT:  Right Ear: External ear normal.  Left Ear: External ear normal.  Tm pearly with light reflex bilaterally  +cobblestoning on posterior pharynx Mild TTP over maxillary sinuses Right nasal turbinate severely edematous   Neck: Normal range of motion. Neck supple. No thyromegaly present.  Cardiovascular: Normal rate, regular rhythm and normal heart sounds.  Pulmonary/Chest: Effort normal and breath sounds normal. No  respiratory distress.  Abdominal: Soft. Bowel sounds are normal.  Lymphadenopathy:    She has no cervical adenopathy.  Skin: Skin is warm  and dry. Capillary refill takes less than 2 seconds.  Vitals reviewed.      Assessment/plan: 1. Post-nasal drip flonase nightly. Let us know if not getting better/worsening symptoms. Has some benign looking cysts on faucial pillars as well as the cobblestoning on pharynx and want her to make sure these resolve or has f/u with pcp.   2. Laryngitis Steroid shot, cool mist humidifier, vocal rest and fluids. Side effects of steroid shot discussed. Has not had in last 3 months.  - betamethasone acetate-betamethasone sodium phosphate (CELESTONE) injection 12 mg\     Return if symptoms worsen or fail to improve.   Orma Flaming, MD [place]   11/05/2018

## 2018-11-05 NOTE — Telephone Encounter (Signed)
Called patient to get her scheduled for Prolia injectio- left VM requesting a call back to have this done-she will owe $90 at check-in and can be scheduled after 11/08/18

## 2018-11-05 NOTE — Telephone Encounter (Signed)
Called pt and LVM requesting returned call. 

## 2018-11-13 ENCOUNTER — Ambulatory Visit (INDEPENDENT_AMBULATORY_CARE_PROVIDER_SITE_OTHER): Payer: Medicare Other

## 2018-11-13 DIAGNOSIS — M818 Other osteoporosis without current pathological fracture: Secondary | ICD-10-CM | POA: Diagnosis not present

## 2018-11-13 MED ORDER — DENOSUMAB 60 MG/ML ~~LOC~~ SOSY
60.0000 mg | PREFILLED_SYRINGE | Freq: Once | SUBCUTANEOUS | Status: AC
  Administered 2018-11-13: 60 mg via SUBCUTANEOUS

## 2018-11-13 NOTE — Progress Notes (Signed)
Per orders of Dr. Cruzita Lederer injection of Prolia 60mg /mL given today in L upper outer aspect of arm by A. Corinn Stoltzfus, LPN . Patient tolerated injection well.

## 2018-12-07 ENCOUNTER — Encounter: Payer: Self-pay | Admitting: Family Medicine

## 2018-12-12 ENCOUNTER — Ambulatory Visit: Payer: Medicare Other | Admitting: Family Medicine

## 2018-12-12 ENCOUNTER — Encounter: Payer: Self-pay | Admitting: Family Medicine

## 2018-12-12 VITALS — BP 126/82 | HR 76 | Temp 98.2°F | Ht 67.5 in | Wt 140.6 lb

## 2018-12-12 DIAGNOSIS — E785 Hyperlipidemia, unspecified: Secondary | ICD-10-CM | POA: Insufficient documentation

## 2018-12-12 DIAGNOSIS — Z0000 Encounter for general adult medical examination without abnormal findings: Secondary | ICD-10-CM | POA: Diagnosis not present

## 2018-12-12 DIAGNOSIS — E559 Vitamin D deficiency, unspecified: Secondary | ICD-10-CM

## 2018-12-12 DIAGNOSIS — M2042 Other hammer toe(s) (acquired), left foot: Secondary | ICD-10-CM

## 2018-12-12 DIAGNOSIS — K219 Gastro-esophageal reflux disease without esophagitis: Secondary | ICD-10-CM

## 2018-12-12 DIAGNOSIS — R7309 Other abnormal glucose: Secondary | ICD-10-CM

## 2018-12-12 DIAGNOSIS — Z79899 Other long term (current) drug therapy: Secondary | ICD-10-CM

## 2018-12-12 LAB — COMPREHENSIVE METABOLIC PANEL
ALT: 11 U/L (ref 0–35)
AST: 16 U/L (ref 0–37)
Albumin: 4.4 g/dL (ref 3.5–5.2)
Alkaline Phosphatase: 66 U/L (ref 39–117)
BUN: 16 mg/dL (ref 6–23)
CO2: 31 mEq/L (ref 19–32)
Calcium: 9.4 mg/dL (ref 8.4–10.5)
Chloride: 101 mEq/L (ref 96–112)
Creatinine, Ser: 0.84 mg/dL (ref 0.40–1.20)
GFR: 70.4 mL/min (ref >60.00)
Glucose, Bld: 93 mg/dL (ref 70–99)
Potassium: 4.7 mEq/L (ref 3.5–5.1)
Sodium: 139 mEq/L (ref 135–145)
Total Bilirubin: 0.4 mg/dL (ref 0.2–1.2)
Total Protein: 6.9 g/dL (ref 6.0–8.3)

## 2018-12-12 LAB — HEMOGLOBIN A1C: Hgb A1c MFr Bld: 5.9 % (ref 4.6–6.5)

## 2018-12-12 LAB — CBC
HCT: 42.1 % (ref 36.0–46.0)
Hemoglobin: 14.3 g/dL (ref 12.0–15.0)
MCHC: 34.1 g/dL (ref 30.0–36.0)
MCV: 90.4 fl (ref 78.0–100.0)
Platelets: 320 10*3/uL (ref 150.0–400.0)
RBC: 4.65 Mil/uL (ref 3.87–5.11)
RDW: 13.8 % (ref 11.5–15.5)
WBC: 6.5 10*3/uL (ref 4.0–10.5)

## 2018-12-12 LAB — LIPID PANEL
Cholesterol: 285 mg/dL — ABNORMAL HIGH (ref 0–200)
HDL: 82 mg/dL (ref >39.00)
LDL Cholesterol: 186 mg/dL — ABNORMAL HIGH (ref 0–99)
NonHDL: 202.64
Total CHOL/HDL Ratio: 3
Triglycerides: 81 mg/dL (ref 0.0–149.0)
VLDL: 16.2 mg/dL (ref 0.0–40.0)

## 2018-12-12 LAB — VITAMIN D 25 HYDROXY (VIT D DEFICIENCY, FRACTURES): VITD: 41.53 ng/mL (ref 30.00–100.00)

## 2018-12-12 NOTE — Assessment & Plan Note (Signed)
GERD-on Nexium which is not ideal for osteoporosis-she did not tolerate 20 mg dose.  We checked a B12 in August which was normal but low normal-discussed taking over-the-counter supplement- she is taking this at 1000 mcg

## 2018-12-12 NOTE — Assessment & Plan Note (Signed)
Hyperlipidemia-has had elevations in the past with last LDL at 179 but excellent HDL-update again today.  Father did have a heart attack at 51 but he was a smoker. She is interested in coronary CT if risk above 12%- would want to try to avoid statin if at all possible

## 2018-12-12 NOTE — Progress Notes (Signed)
Phone: [phone]  Subjective:  Patient presents today for their annual physical. Chief complaint-noted.   See problem oriented charting- ROS- full  review of systems was completed and negative except for: tinnitus, short of breat once when climbing to top of a stadium, rare palpitaoitns at night, allergies, joint pain  The following were reviewed and entered/updated in epic: Past Medical History:  Diagnosis Date  . Basal cell carcinoma of skin    left ankle, right thigh  . Bursitis    right hip  . Cancer [facility]) 2006   Breast/right  . Diverticulosis   . GERD (gastroesophageal reflux disease)   . History of breast cancer/33 radiation treatments/ no chemo    2006  S/P RIGHT LUMPECTOMY AND RADIATION FOLLOWED BY DR RUBIN--  NO RECURRENCE  . History of kidney stones   . Hyperplastic colon polyp   . IBS (irritable bowel syndrome)   . Idiopathic peripheral neuropathy    bilateral tingling/ due to fall in [place]  . Internal hemorrhoid   . Kidney stone   . Leiomyoma    Multiple small  . Liver cyst 2006   Benign  . OA (osteoarthritis)   . Osteoporosis 2019   T score -2.5  . Personal history of radiation therapy   . Raynaud disease   . Renal calculus, left    non obstructive  . Rib pain on left side    able to lay on left side  . Rosacea   . Tinnitus    Patient Active Problem List   Diagnosis Date Noted  . Hyperlipidemia 12/12/2018    Priority: Medium  . Rib pain on left side 03/21/2017    Priority: Medium  . Scoliosis 03/21/2017    Priority: Medium  . Osteoporosis 06/29/2009    Priority: Medium  . 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    Priority: Medium  . GERD 05/01/1999    Priority: Medium  . Hearing loss associated with syndrome of both ears 12/05/2016    Priority: Low  . Neuropathy 03/06/2012    Priority: Low  . COLONIC POLYPS 05/25/2006    Priority: Low  . Vitamin D insufficiency 08/13/2018  . Elevated glycosylated hemoglobin 08/13/2018    Past Surgical History:  Procedure Laterality Date  . BREAST BIOPSY Left 2016   benign  . BREAST EXCISIONAL BIOPSY Right 1973   benign  . BREAST LUMPECTOMY  2006    Right -- S/P RADIATION--  NO RECURRENCE  . DILATION AND CURETTAGE OF UTERUS    . EXTRACORPOREAL SHOCK WAVE LITHOTRIPSY     X2  . HYSTEROSCOPY    . HYSTEROSCOPY W/D&C  08/03/2012   Procedure: DILATATION AND CURETTAGE /HYSTEROSCOPY;  Surgeon: Anastasio Auerbach, MD;  Location: [facility];  Service: Gynecology;  Laterality: N/A;  . SUPRANUMERARY NIPPLE EXCISION  1973   CYST  . TONSILLECTOMY  AGE 34    Family History  Problem Relation Age of Onset  . Dementia Mother   . Depression Mother   . Osteoporosis Mother   . Stroke Father   . Heart attack Father 6  . Rectal cancer Maternal Grandmother   . Colon cancer Maternal Grandmother   . Stroke Sister        TIA. ? dementia  . Dementia Sister   . Breast cancer Neg Hx     Medications- reviewed and updated Current Outpatient Medications  Medication Sig Dispense Refill  . Cholecalciferol (VITAMIN D PO) Take by mouth.    . Cyanocobalamin (  VITAMIN B 12 PO) Take by mouth.    . denosumab (PROLIA) 60 MG/ML SOLN injection Inject 60 mg into the skin every 6 (six) months. Administer in upper arm, thigh, or abdomen    . esomeprazole (NEXIUM) 40 MG capsule TAKE ONE CAPSULE BY MOUTH EVERY DAY AT NOON 90 capsule 3   No current facility-administered medications for this visit.     Allergies-reviewed and updated Allergies  Allergen Reactions  . Amoxicillin Diarrhea    GI upset    Social History   Social History Narrative   Married (husband patient of Dr. Yong Channel). 1 daughter. 1 granddaughter.       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       Hobbies: reading, travel, time with granddaughter. Very active    Objective: BP 126/82 (BP Location: Left Arm, Patient Position: Sitting, Cuff Size: Large)   Pulse 76   Temp 98.2 F (36.8 C) (Oral)   Ht 5' 7.5"  (1.715 m)   Wt 140 lb 9.6 oz (63.8 kg)   LMP  (LMP Unknown)   SpO2 98%   BMI 21.70 kg/m  Gen: NAD, resting comfortably HEENT: Mucous membranes are moist. Oropharynx normal Breasts: normal appearance, no masses or tenderness other than post surgical changes Neck: no thyromegaly CV: RRR no murmurs rubs or gallops Lungs: CTAB no crackles, wheeze, rhonchi Abdomen: soft/nontender/nondistended/normal bowel sounds. No rebound or guarding.  Ext: no edema Skin: warm, dry Neuro: grossly normal, moves all extremities, PERRLA  Assessment/Plan:  73 y.o. female presenting for annual physical.  Health Maintenance counseling: 1. Anticipatory guidance: Patient counseled regarding regular dental exams -q6 months, eye exams - yearly,  avoiding smoking and second hand smoke , limiting alcohol to 1 beverage per day .   2. Risk factor reduction:  Advised patient of need for regular exercise and diet rich and fruits and vegetables to reduce risk of heart attack and stroke. Exercise- going 2 days a week to exercise and also does some walking outside- harder to do in the winter. Diet- she has helped husband lose about 50 lbs- her weight is stable- she feels she has loosened up some on diet so might tighten back up to decrease diabetes risk. .  Wt Readings from Last 3 Encounters:  12/12/18 140 lb 9.6 oz (63.8 kg)  11/05/18 142 lb 6.4 oz (64.6 kg)  09/13/18 142 lb (64.4 kg)  3. Immunizations/screenings/ancillary studies-was able to get Shingrix at pharmacy and now fully up-to-date  Immunization History  Administered Date(s) Administered  . Influenza Whole 10/29/2009, 08/11/2010, 08/19/2011  . Influenza, High Dose Seasonal PF 09/29/2016, 08/21/2017  . Influenza,inj,Quad PF,6+ Mos 08/21/2013, 09/02/2014  . Influenza-Unspecified 09/09/2015, 08/01/2018  . Pneumococcal Conjugate-13 09/02/2014  . Pneumococcal Polysaccharide-23 03/06/2012  . Tdap 03/06/2012  . Zoster Recombinat (Shingrix) 03/10/2018, 05/10/2018    4. Cervical cancer screening- she has passed age based screenings.  Last Pap in 2019 was normal per her report with Dr. Phineas Real 5. Breast cancer screening-  breast exam with Dr. Phineas Real and mammogram diagnostic mammogram and US breast  October 2019- she has a soreness sensation on right breast since august- not getting worse - will monitor- benign clinical exam without skin changes or mass.  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 Colon cancer screening -abdomen 2018 with 5-year repeat planned.  7. Skin cancer screening-sees Dr. Ubaldo Glassing on a yearly basis.  Advised regular sunscreen use. Denies worrisome, changing, or new skin lesions.  8. Birth control/STD check-postmenopausal  and monogamous 9. Osteoporosis -see below 10.  Never smoker  Status of chronic or acute concerns   Hyperglycemia-we will update A1c given prior elevation. See lifestyle recommendations above  Left foot- bunion as well as hammertoe- will refer to [facility] for their opinion she would prefer not to have surgery- I told her she has some serious deformity so that may be dificult  Neuropathy- still worse when cold. We are trying to prevent development in diabetes in light of this.   Small bumps in back of throat- was noted as "benign looking cysts on faucial pillars"- patient has some rhinorrhea and cobblestoning on exam today - discussed possibly treating allergies- she declines for now. Can also ask dentist- there is a small cyst vs. Papule on right side of throat  . Also stye noted- advised warm compresses- she sometimes has to get doxycycline from her eye doctor  Vitamin D deficiency-looked good last year.  She continues on 2000 units daily.  Continue current dose and update vitamin D   Osteoporosis-followed Dr. Cruzita Lederer.  Had been on Fosamax in the past.  She continues on Prolia at present.   Possible hearing loss- have encouraged both her and her husband to get hearing tested.  Encouraged again today   Hyperlipidemia Hyperlipidemia-has had elevations in the past with last LDL at 179 but excellent HDL-update again today.  Father did have a heart attack at 57 but he was a smoker. She is interested in coronary CT if risk above 12%- would want to try to avoid statin if at all possible  GERD GERD-on Nexium which is not ideal for osteoporosis-she did not tolerate 20 mg dose.  We checked a B12 in August which was normal but low normal-discussed taking over-the-counter supplement- she is taking this at 1000 mcg    Future Appointments  Date Time Provider [place]  06/27/2019 10:00 AM [facility] HEALTH COACH [facility]  08/19/2019  9:00 AM Philemon Kingdom, MD [facility] None  09/16/2019  9:00 AM Font[place], Belinda Block, MD [facility]   1 year physical or sooner if needed  Lab/Order associations: Fasting Preventative health care - Plan: VITAMIN D 25 Hydroxy (Vit-D Deficiency, Fractures), CBC, Comprehensive metabolic panel, Lipid panel, Hemoglobin A1c, CANCELED: Vitamin B12  Vitamin D insufficiency - Plan: VITAMIN D 25 Hydroxy (Vit-D Deficiency, Fractures)  Elevated glycosylated hemoglobin - Plan: Hemoglobin A1c  Hyperlipidemia, unspecified hyperlipidemia type - Plan: CBC, Comprehensive metabolic panel, Lipid panel  High risk medication use - Plan: CANCELED: Vitamin B12  Hammer toe of left foot - Plan: Ambulatory referral to Podiatry  Gastroesophageal reflux disease, esophagitis presence not specified  Return precautions advised.  Garret Reddish, MD

## 2018-12-12 NOTE — Patient Instructions (Addendum)
Please stop by lab before you go   We will call you within two weeks about your referral to podiatry- [facility]. If you do not hear within 3 weeks, give [place] a call.   If your 10-year risk of heart attack or stroke is above 12%- I do think it be reasonable to go ahead and start cholesterol medicine given your family history.  If you want to consider holding off on this we could get more information with coronary CT-likely $150 cash cost  No other changes today-could consider allergy medicine such as consistent Flonase as back of throat appears allergy related most likely.  Could also get your dentist opinion-you do have one small bump on the back right throat

## 2018-12-13 ENCOUNTER — Encounter: Payer: Self-pay | Admitting: Family Medicine

## 2018-12-13 DIAGNOSIS — E785 Hyperlipidemia, unspecified: Secondary | ICD-10-CM

## 2018-12-20 ENCOUNTER — Ambulatory Visit: Payer: Medicare Other | Admitting: Podiatry

## 2018-12-20 ENCOUNTER — Encounter: Payer: Self-pay | Admitting: Podiatry

## 2018-12-20 ENCOUNTER — Ambulatory Visit (INDEPENDENT_AMBULATORY_CARE_PROVIDER_SITE_OTHER): Payer: Medicare Other

## 2018-12-20 VITALS — BP 125/80 | HR 90 | Resp 16

## 2018-12-20 DIAGNOSIS — M2012 Hallux valgus (acquired), left foot: Secondary | ICD-10-CM

## 2018-12-20 DIAGNOSIS — M2042 Other hammer toe(s) (acquired), left foot: Secondary | ICD-10-CM | POA: Diagnosis not present

## 2018-12-20 DIAGNOSIS — M2011 Hallux valgus (acquired), right foot: Secondary | ICD-10-CM | POA: Diagnosis not present

## 2018-12-20 NOTE — Progress Notes (Signed)
   Subjective:    Patient ID: Dana Chambers, female    DOB: [birth year], 73 y.o.   MRN: [id number]  HPI    Review of Systems  All other systems reviewed and are negative.      Objective:   Physical Exam        Assessment & Plan:

## 2018-12-20 NOTE — Progress Notes (Signed)
Subjective:   Patient ID: Dana Chambers, female   DOB: 73 y.o.   MRN: [id number]   HPI Patient presents stating the bunion on the left foot has become worse over time and the second toe has really lifted in the area and is rigidly contracted and at times it can be bothersome.  Patient is more concerned about the long-term and what suggestions we have and states mild deformity on the right.  Patient does not smoke and likes to be active   Review of Systems  All other systems reviewed and are negative.       Objective:  Physical Exam Vitals signs and nursing note reviewed.  Constitutional:      Appearance: She is well-developed.  Pulmonary:     Effort: Pulmonary effort is normal.  Musculoskeletal: Normal range of motion.  Skin:    General: Skin is warm.  Neurological:     Mental Status: She is alert.     Neurovascular status intact muscle strength is adequate range of motion within normal visit with patient found to have significant structural bone deformity left over right with significant bunion deformity left over right and rigid contracture digit to left over right with pain.  Patient was also has mild discoloration of the left hallux nail localized in nature and was found to have good digital perfusion well oriented x3     Assessment:  Significant structural malalignment left foot with bunion deformity and rigid contracture second digit with mild deformity right foot with changes on top of the second toe and slight nail discoloration of the left hallux     Plan:  H&P conditions reviewed and at this point I have recommended padding therapy and we discussed surgery and I do think a distal osteotomy even though would not give complete results would give her a quicker recovery and hopefully prevent complication rate.  Patient is get a hold off on surgery and will consider this and at one point future may be necessary and padding applied and she will start topical anti-fungal cream  for the left hallux nail  X-rays indicate significant structural bunion deformity left over right with rigid contracture digit to left and hallux interphalangeus left over right

## 2018-12-20 NOTE — Patient Instructions (Addendum)
Hammer Toe  Hammer toe is a change in the shape (a deformity) of your toe. The deformity causes the middle joint of your toe to stay bent. This causes pain, especially when you are wearing shoes. Hammer toe starts gradually. At first, the toe can be straightened. Gradually over time, the deformity becomes stiff and permanent. Early treatments to keep the toe straight may relieve pain. As the deformity becomes stiff and permanent, surgery may be needed to straighten the toe. What are the causes? Hammer toe is caused by abnormal bending of the toe joint that is closest to your foot. It happens gradually over time. This pulls on the muscles and connections (tendons) of the toe joint, making them weak and stiff. It is often related to wearing shoes that are too short or narrow and do not let your toes straighten. What increases the risk? You may be at greater risk for hammer toe if you:  Are female.  Are older.  Wear shoes that are too small.  Wear high-heeled shoes that pinch your toes.  Are a ballet dancer.  Have a second toe that is longer than your big toe (first toe).  Injure your foot or toe.  Have arthritis.  Have a family history of hammer toe.  Have a nerve or muscle disorder. What are the signs or symptoms? The main symptoms of this condition are pain and deformity of the toe. The pain is worse when wearing shoes, walking, or running. Other symptoms may include:  Corns or calluses over the bent part of the toe or between the toes.  Redness and a burning feeling on the toe.  An open sore that forms on the top of the toe.  Not being able to straighten the toe. How is this diagnosed? This condition is diagnosed based on your symptoms and a physical exam. During the exam, your health care provider will try to straighten your toe to see how stiff the deformity is. You may also have tests, such as:  A blood test to check for rheumatoid arthritis.  An X-ray to show how  severe the deformity is. How is this treated? Treatment for this condition will depend on how stiff the deformity is. Surgery is often needed. However, sometimes a hammer toe can be straightened without surgery. Treatments that do not involve surgery include:  Taping the toe into a straightened position.  Using pads and cushions to protect the toe (orthotics).  Wearing shoes that provide enough room for the toes.  Doing toe-stretching exercises at home.  Taking an NSAID to reduce pain and swelling. If these treatments do not help or the toe cannot be straightened, surgery is the next option. The most common surgeries used to straighten a hammer toe include:  Arthroplasty. In this procedure, part of the joint is removed, and that allows the toe to straighten.  Fusion. In this procedure, cartilage between the two bones of the joint is taken out and the bones are fused together into one longer bone.  Implantation. In this procedure, part of the bone is removed and replaced with an implant to let the toe move again.  Flexor tendon transfer. In this procedure, the tendons that curl the toes down (flexor tendons) are repositioned. Follow these instructions at home:  Take over-the-counter and prescription medicines only as told by your health care provider.  Do toe straightening and stretching exercises as told by your health care provider.  Keep all follow-up visits as told by your health care   provider. This is important. How is this prevented?  Wear shoes that give your toes enough room and do not cause pain.  Do not wear high-heeled shoes. Contact a health care provider if:  Your pain gets worse.  Your toe becomes red or swollen.  You develop an open sore on your toe. This information is not intended to replace advice given to you by your health care provider. Make sure you discuss any questions you have with your health care provider. Document Released: 11/11/2000 Document  Revised: 06/12/2017 Document Reviewed: 03/09/2016 Elsevier Interactive Patient Education  2019 [place]  A bunion is a bump on the base of the big toe that forms when the bones of the big toe joint move out of position. Bunions may be small at first, but they often get larger over time. They can make walking painful. What are the causes? A bunion may be caused by:  Wearing narrow or pointed shoes that force the big toe to press against the other toes.  Abnormal foot development that causes the foot to roll inward (pronate).  Changes in the foot that are caused by certain diseases, such as rheumatoid arthritis or polio.  A foot injury. What increases the risk? The following factors may make you more likely to develop this condition:  Wearing shoes that squeeze the toes together.  Having certain diseases, such as: ? Rheumatoid arthritis. ? Polio. ? Cerebral palsy.  Having family members who have bunions.  Being born with a foot deformity, such as flat feet or low arches.  Doing activities that put a lot of pressure on the feet, such as ballet dancing. What are the signs or symptoms? The main symptom of a bunion is a noticeable bump on the big toe. Other symptoms may include:  Pain.  Swelling around the big toe.  Redness and inflammation.  Thick or hardened skin on the big toe or between the toes.  Stiffness or loss of motion in the big toe.  Trouble with walking. How is this diagnosed? A bunion may be diagnosed based on your symptoms, medical history, and activities. You may have tests, such as:  X-rays. These allow your health care provider to check the position of the bones in your foot and look for damage to your joint. They also help your health care provider determine the severity of your bunion and the best way to treat it.  Joint aspiration. In this test, a sample of fluid is removed from the toe joint. This test may be done if you are in a lot  of pain. It helps rule out diseases that cause painful swelling of the joints, such as arthritis. How is this treated? Treatment depends on the severity of your symptoms. The goal of treatment is to relieve symptoms and prevent the bunion from getting worse. Your health care provider may recommend:  Wearing shoes that have a wide toe box.  Using bunion pads to cushion the affected area.  Taping your toes together to keep them in a normal position.  Placing a device inside your shoe (orthotics) to help reduce pressure on your toe joint.  Taking medicine to ease pain, inflammation, and swelling.  Applying heat or ice to the affected area.  Doing stretching exercises.  Surgery to remove scar tissue and move the toes back into their normal position. This treatment is rare. Follow these instructions at home: Managing pain, stiffness, and swelling   If directed, put ice on the painful  area: ? Put ice in a plastic bag. ? Place a towel between your skin and the bag. ? Leave the ice on for 20 minutes, 2-3 times a day. Activity   If directed, apply heat to the affected area before you exercise. Use the heat source that your health care provider recommends, such as a moist heat pack or a heating pad. ? Place a towel between your skin and the heat source. ? Leave the heat on for 20-30 minutes. ? Remove the heat if your skin turns bright red. This is especially important if you are unable to feel pain, heat, or cold. You may have a greater risk of getting burned.  Do exercises as told by your health care provider. General instructions  Support your toe joint with proper footwear, shoe padding, or taping as told by your health care provider.  Take over-the-counter and prescription medicines only as told by your health care provider.  Keep all follow-up visits as told by your health care provider. This is important. Contact a health care provider if your symptoms:  Get worse.  Do not  improve in 2 weeks. Get help right away if you have:  Severe pain and trouble with walking. Summary  A bunion is a bump on the base of the big toe that forms when the bones of the big toe joint move out of position.  Bunions can make walking painful.  Treatment depends on the severity of your symptoms.  Support your toe joint with proper footwear, shoe padding, or taping as told by your health care provider. This information is not intended to replace advice given to you by your health care provider. Make sure you discuss any questions you have with your health care provider. Document Released: 11/14/2005 Document Revised: 03/27/2018 Document Reviewed: 03/27/2018 Elsevier Interactive Patient Education  2019 [place]  Hammer toe is a change in the shape (a deformity) of your toe. The deformity causes the middle joint of your toe to stay bent. This causes pain, especially when you are wearing shoes. Hammer toe starts gradually. At first, the toe can be straightened. Gradually over time, the deformity becomes stiff and permanent. Early treatments to keep the toe straight may relieve pain. As the deformity becomes stiff and permanent, surgery may be needed to straighten the toe. What are the causes? Hammer toe is caused by abnormal bending of the toe joint that is closest to your foot. It happens gradually over time. This pulls on the muscles and connections (tendons) of the toe joint, making them weak and stiff. It is often related to wearing shoes that are too short or narrow and do not let your toes straighten. What increases the risk? You may be at greater risk for hammer toe if you:  Are female.  Are older.  Wear shoes that are too small.  Wear high-heeled shoes that pinch your toes.  Are a Engineer, mining.  Have a second toe that is longer than your big toe (first toe).  Injure your foot or toe.  Have arthritis.  Have a family history of hammer  toe.  Have a nerve or muscle disorder. What are the signs or symptoms? The main symptoms of this condition are pain and deformity of the toe. The pain is worse when wearing shoes, walking, or running. Other symptoms may include:  Corns or calluses over the bent part of the toe or between the toes.  Redness and a burning feeling on the  toe.  An open sore that forms on the top of the toe.  Not being able to straighten the toe. How is this diagnosed? This condition is diagnosed based on your symptoms and a physical exam. During the exam, your health care provider will try to straighten your toe to see how stiff the deformity is. You may also have tests, such as:  A blood test to check for rheumatoid arthritis.  An X-ray to show how severe the deformity is. How is this treated? Treatment for this condition will depend on how stiff the deformity is. Surgery is often needed. However, sometimes a hammer toe can be straightened without surgery. Treatments that do not involve surgery include:  Taping the toe into a straightened position.  Using pads and cushions to protect the toe (orthotics).  Wearing shoes that provide enough room for the toes.  Doing toe-stretching exercises at home.  Taking an NSAID to reduce pain and swelling. If these treatments do not help or the toe cannot be straightened, surgery is the next option. The most common surgeries used to straighten a hammer toe include:  Arthroplasty. In this procedure, part of the joint is removed, and that allows the toe to straighten.  Fusion. In this procedure, cartilage between the two bones of the joint is taken out and the bones are fused together into one longer bone.  Implantation. In this procedure, part of the bone is removed and replaced with an implant to let the toe move again.  Flexor tendon transfer. In this procedure, the tendons that curl the toes down (flexor tendons) are repositioned. Follow these instructions  at home:  Take over-the-counter and prescription medicines only as told by your health care provider.  Do toe straightening and stretching exercises as told by your health care provider.  Keep all follow-up visits as told by your health care provider. This is important. How is this prevented?  Wear shoes that give your toes enough room and do not cause pain.  Do not wear high-heeled shoes. Contact a health care provider if:  Your pain gets worse.  Your toe becomes red or swollen.  You develop an open sore on your toe. This information is not intended to replace advice given to you by your health care provider. Make sure you discuss any questions you have with your health care provider. Document Released: 11/11/2000 Document Revised: 06/12/2017 Document Reviewed: 03/09/2016 Elsevier Interactive Patient Education  [employer].

## 2018-12-25 ENCOUNTER — Encounter: Payer: Self-pay | Admitting: Family Medicine

## 2019-01-01 ENCOUNTER — Other Ambulatory Visit: Payer: Medicare Other

## 2019-01-16 ENCOUNTER — Ambulatory Visit (INDEPENDENT_AMBULATORY_CARE_PROVIDER_SITE_OTHER)
Admission: RE | Admit: 2019-01-16 | Discharge: 2019-01-16 | Disposition: A | Discharge status: Home / Self Care | Payer: Self-pay | Source: Ambulatory Visit | Attending: Family Medicine | Admitting: Family Medicine

## 2019-01-16 DIAGNOSIS — E785 Hyperlipidemia, unspecified: Secondary | ICD-10-CM

## 2019-02-07 ENCOUNTER — Other Ambulatory Visit: Payer: Self-pay

## 2019-02-07 MED ORDER — ESOMEPRAZOLE MAGNESIUM 40 MG PO CPDR: DELAYED_RELEASE_CAPSULE | ORAL | 3 refills | Status: DC

## 2019-03-26 ENCOUNTER — Encounter: Payer: Self-pay | Admitting: Internal Medicine

## 2019-03-26 NOTE — Telephone Encounter (Signed)
Please advise 

## 2019-05-02 ENCOUNTER — Telehealth: Payer: Self-pay | Admitting: Internal Medicine

## 2019-05-02 NOTE — Telephone Encounter (Signed)
Please call patient at ph# [phone] or ph# [phone] re: Prolia injection

## 2019-05-09 NOTE — Telephone Encounter (Signed)
She is due 05/17/19 but I have to complete may patients first

## 2019-05-13 IMAGING — CT CT HEART SCORING
2 series · 16 of 20 positions shown, 18 images · non-contrast
Comparison: Chest radiograph of 09/07/2005

Addendum:
EXAM:
OVER-READ INTERPRETATION  CT CHEST

The following report is an over-read performed by radiologist Dr.
Bastidas Heilbron [REDACTED] on 01/16/2019. This over-read
does not include interpretation of cardiac or coronary anatomy or
pathology. The calcium score interpretation by the cardiologist is
attached.
CLINICAL DATA: Risk stratification
Coronary Calcium Score
TECHNIQUE: The patient was scanned on a Siemens Somatom 64 slice scanner. Axial
non-contrast 3 mm slices were carried out through the heart. The
data set was analyzed on a dedicated work station and scored using
the Agatson method.

[Series 3: casc 3.0 i36f 2 bestdiast 64 % · axial · 0.30mm/px · z∈[-195,-105]mm · 8 of 40 slices shown, 10 images]
[im 5/40  vessel]
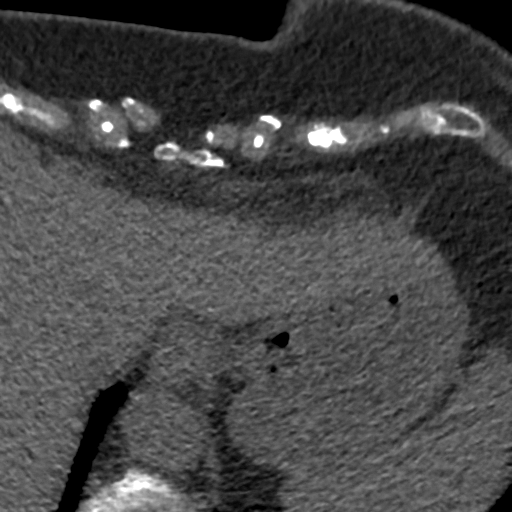
[im 5/40  lung]
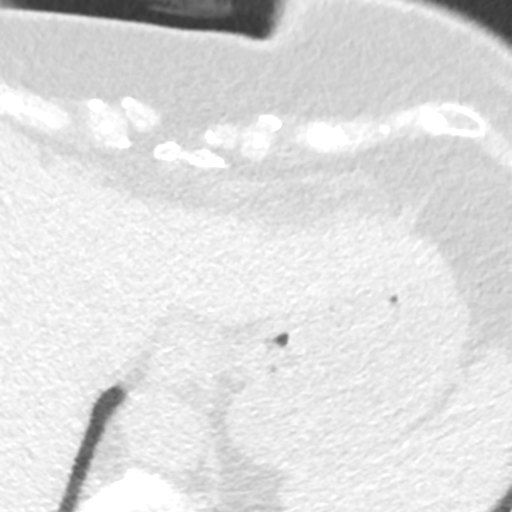
[im 9/40  vessel]
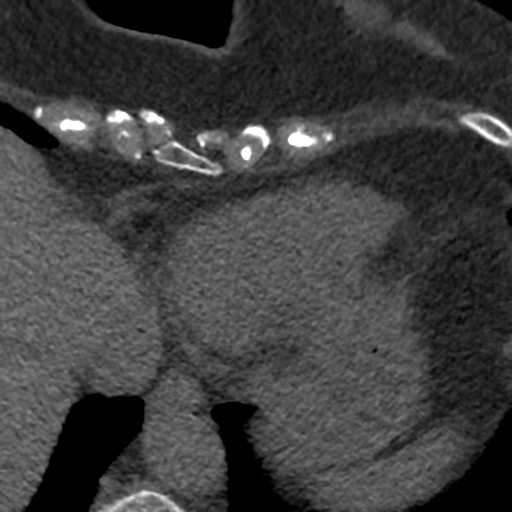
[im 14/40  vessel]
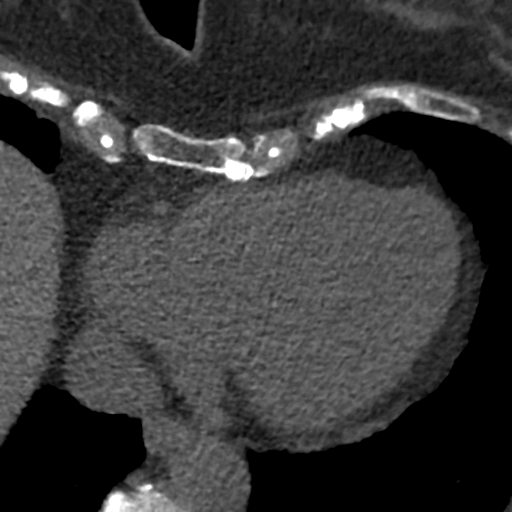
[im 18/40  vessel]
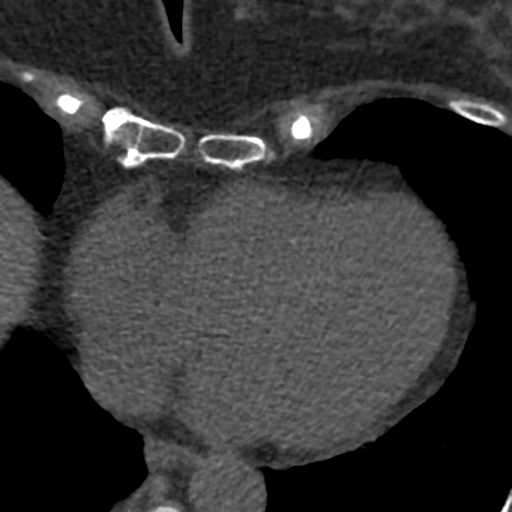
[im 22/40  vessel]
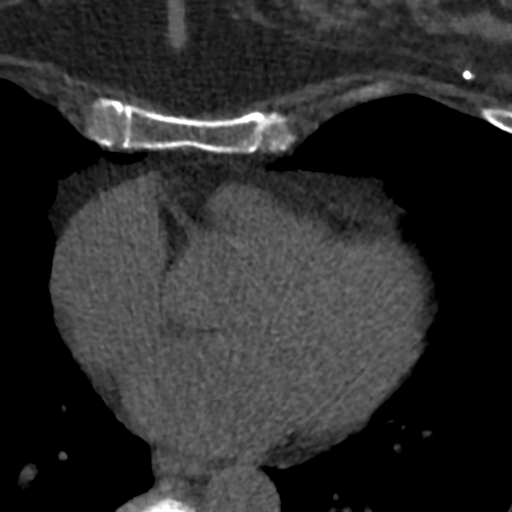
[im 22/40  lung]
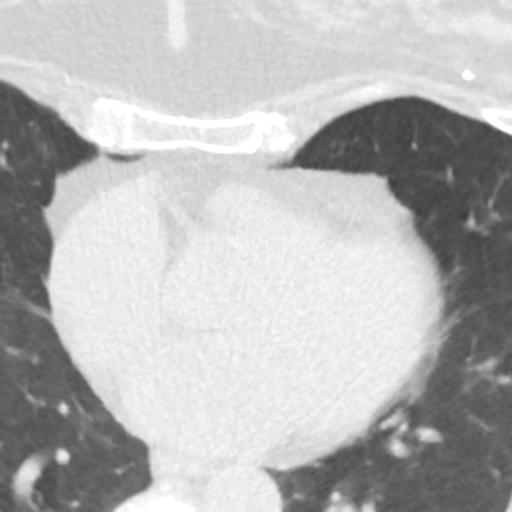
[im 27/40  vessel]
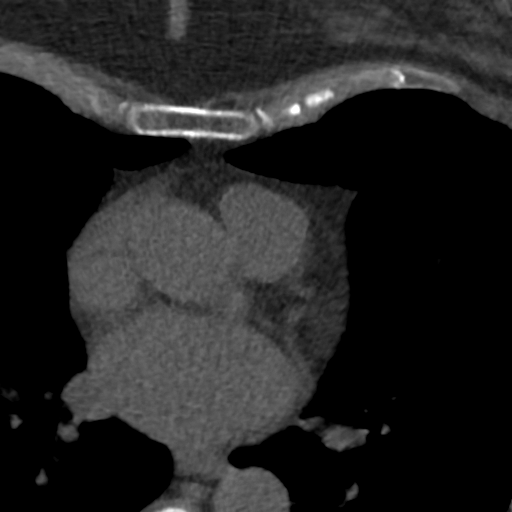
[im 31/40  vessel]
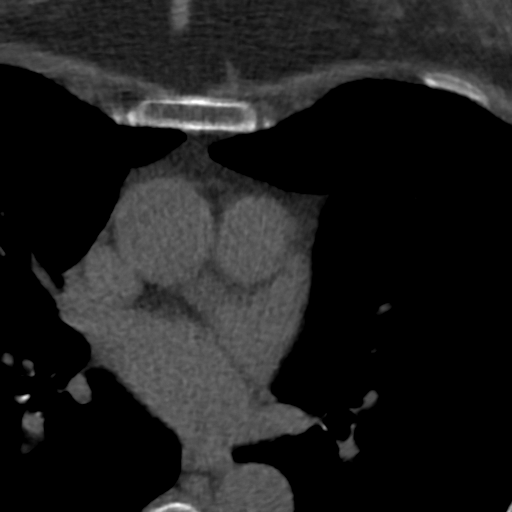
[im 35/40  vessel]
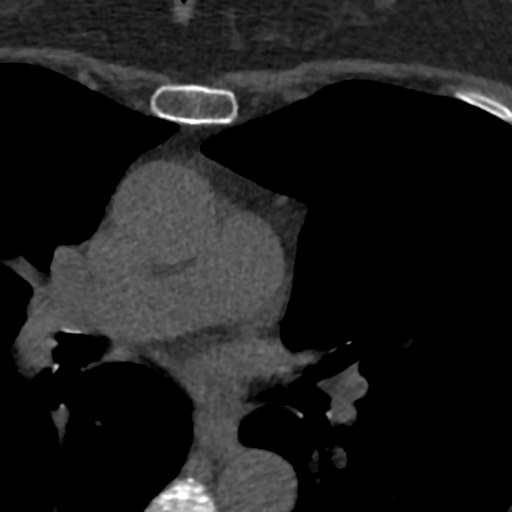

[Series 5: lung st 64 % · axial · 0.59mm/px · z∈[-195,-105]mm · 8 of 40 slices shown]
[im 5/40  lung]
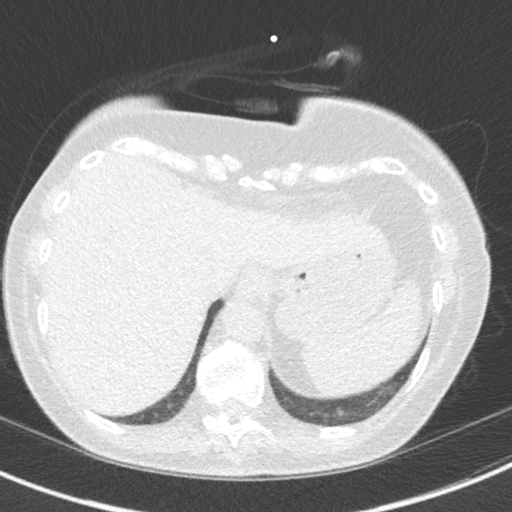
[im 9/40  lung]
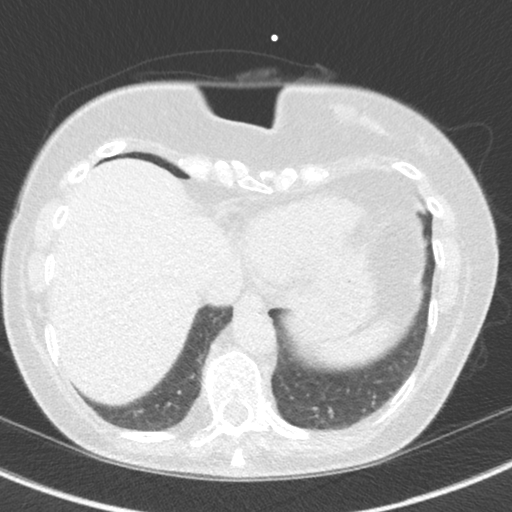
[im 14/40  lung]
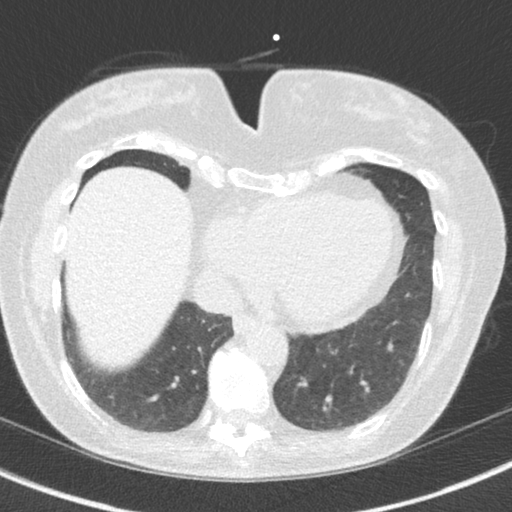
[im 18/40  lung]
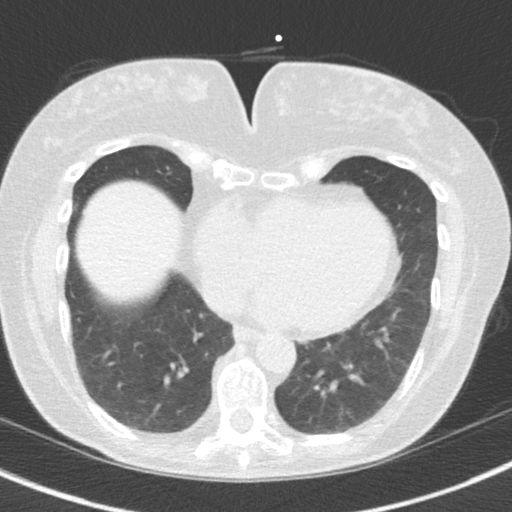
[im 22/40  lung]
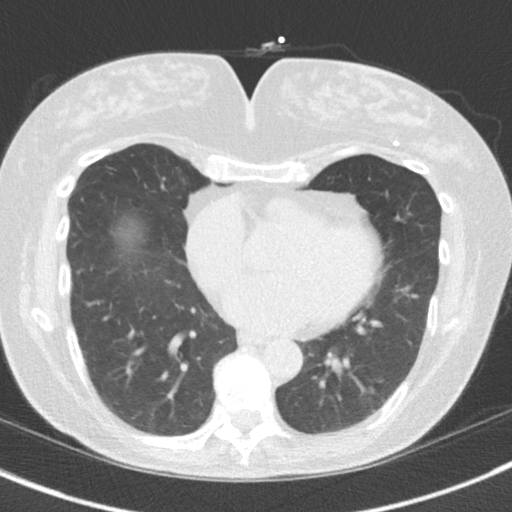
[im 27/40  lung]
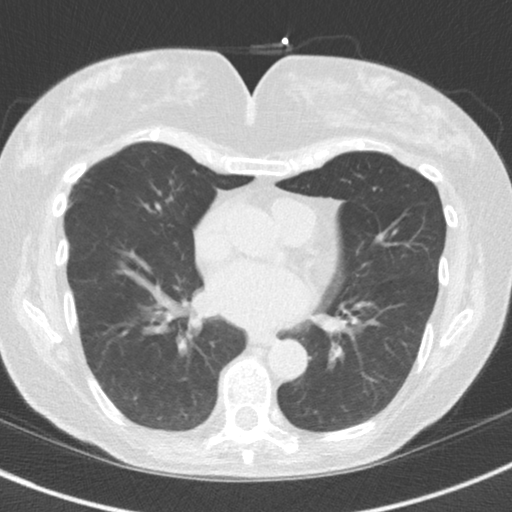
[im 31/40  lung]
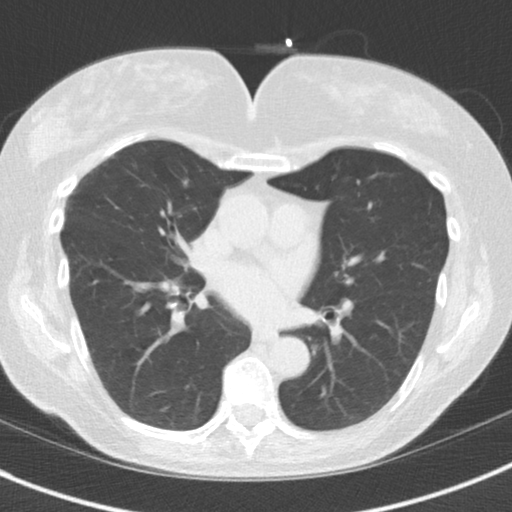
[im 35/40  lung]
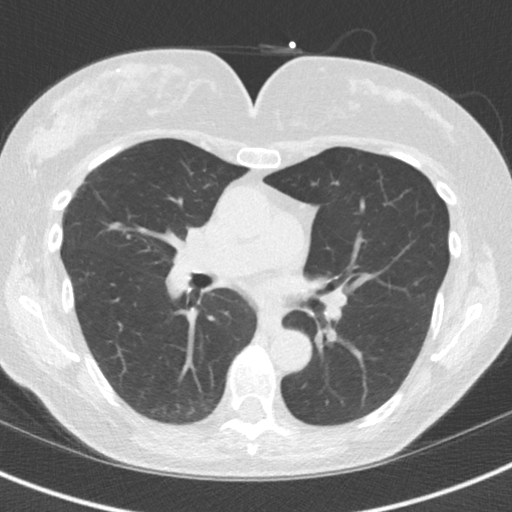

[16 of 20 positions shown; findings below may reference images not displayed]

FINDINGS: Vascular: Heart size accentuated by a moderate pectus excavatum
deformity. Normal aortic caliber.

Mediastinum/Nodes: No imaged thoracic adenopathy.

Lungs/Pleura: No imaged pleural fluid.  Clear imaged lungs.

Upper Abdomen: Normal imaged portions of the liver, spleen, stomach

Musculoskeletal: Mild thoracic spondylosis.
IMPRESSION: 1.  No acute findings in the imaged extracardiac chest.
2. Moderate pectus excavatum deformity.
FINDINGS: Non-cardiac: See separate report from [REDACTED].

Ascending aorta: Normal diameter 3.0 cm

Pericardium: Normal

Coronary arteries: No calcium noted
IMPRESSION: Coronary calcium score of 0.

Naishla Cordaba

*** End of Addendum ***

## 2019-05-13 NOTE — Telephone Encounter (Signed)
Patient was verified in portal for Prolia

## 2019-05-19 ENCOUNTER — Encounter: Payer: Self-pay | Admitting: Internal Medicine

## 2019-05-21 ENCOUNTER — Other Ambulatory Visit: Payer: Self-pay

## 2019-05-21 ENCOUNTER — Ambulatory Visit: Payer: Medicare Other

## 2019-05-21 DIAGNOSIS — M818 Other osteoporosis without current pathological fracture: Secondary | ICD-10-CM

## 2019-05-21 MED ORDER — DENOSUMAB 60 MG/ML ~~LOC~~ SOSY
60.0000 mg | PREFILLED_SYRINGE | Freq: Once | SUBCUTANEOUS | Status: AC
  Administered 2019-05-21: 60 mg via SUBCUTANEOUS

## 2019-05-21 NOTE — Progress Notes (Signed)
Per orders of Dr. Gherghe injection of Prolia given today by L. Elina Streng CMA . Patient tolerated injection well. 

## 2019-05-23 NOTE — Telephone Encounter (Signed)
Patient given Prolia 05/21/19

## 2019-06-27 ENCOUNTER — Ambulatory Visit: Payer: Medicare Other

## 2019-07-08 ENCOUNTER — Ambulatory Visit (INDEPENDENT_AMBULATORY_CARE_PROVIDER_SITE_OTHER): Payer: Medicare Other | Admitting: Family Medicine

## 2019-07-08 ENCOUNTER — Other Ambulatory Visit: Payer: Self-pay

## 2019-07-08 ENCOUNTER — Encounter: Payer: Self-pay | Admitting: Family Medicine

## 2019-07-08 VITALS — BP 120/72 | HR 79 | Temp 98.2°F | Ht 67.5 in | Wt 141.8 lb

## 2019-07-08 DIAGNOSIS — Z0000 Encounter for general adult medical examination without abnormal findings: Secondary | ICD-10-CM | POA: Diagnosis not present

## 2019-07-08 NOTE — Progress Notes (Deleted)
  Phone [phone]   Subjective:  Virtual visit via Video note. Chief complaint: Chief Complaint  Patient presents with  . Medicare Wellness    This visit type was conducted due to national recommendations for restrictions regarding the COVID-19 Pandemic (e.g. social distancing).  This format is felt to be most appropriate for this patient at this time balancing risks to patient and risks to population by having him in for in person visit.  No physical exam was performed (except for noted visual exam or audio findings with Telehealth visits).    Our team/I connected with Dana Chambers at  2:40 PM EDT by a video enabled telemedicine application ([email or web address] or caregility through epic) and verified that I am speaking with the correct person using two identifiers.  Location patient: [facility] Location provider: [facility], office Persons participating in the virtual visit:  patient  Our team/I discussed the limitations of evaluation and management by telemedicine and the availability of in person appointments. In light of current covid-19 pandemic, patient also understands that we are trying to protect them by minimizing in office contact if at all possible.  The patient expressed consent for telemedicine visit and agreed to proceed. Patient understands insurance will be billed.   ROS- ***   Past Medical History-  Patient Active Problem List   Diagnosis Date Noted  . Hyperlipidemia- february 2020 coronary Calcium score of 0 12/12/2018  . Vitamin D insufficiency 08/13/2018  . Elevated glycosylated hemoglobin 08/13/2018  . Rib pain on left side 03/21/2017  . Scoliosis 03/21/2017  . Hearing loss associated with syndrome of both ears 12/05/2016  . Neuropathy 03/06/2012  . Osteoporosis 06/29/2009  . 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 COLONIC POLYPS 05/25/2006  . GERD 05/01/1999    Medications- reviewed and updated Current Outpatient Medications  Medication Sig Dispense  Refill  . Cholecalciferol (VITAMIN D PO) Take by mouth.    . Cyanocobalamin (VITAMIN B 12 PO) Take by mouth.    . esomeprazole (NEXIUM) 40 MG capsule TAKE ONE CAPSULE BY MOUTH EVERY DAY AT NOON 90 capsule 3   No current facility-administered medications for this visit.      Objective:  LMP  (LMP Unknown)  self reported vitals Gen: NAD, resting comfortably Lungs: nonlabored, normal respiratory rate *** Skin: appears dry, no obvious rash     Assessment and Plan   # *** S:***  A/P: ***   Recommended follow up: *** Future Appointments  Date Time Provider [place]  07/08/2019  2:40 PM Marin Olp, MD [facility]  08/19/2019  9:00 AM Philemon Kingdom, MD [facility] None  09/16/2019  9:00 AM Fontaine, Belinda Block, MD [facility]  12/18/2019  9:20 AM Marin Olp, MD [facility]    Lab/Order associations: No diagnosis found.  No orders of the defined types were placed in this encounter.   Return precautions advised.  Jasper Loser, CMA

## 2019-07-08 NOTE — Progress Notes (Signed)
Phone: [phone]    Subjective:   Patient presents today for their annual wellness visit.    Preventive Screening-Counseling & Management  Smoking Status: Never Smoker Second Hand Smoking status: No smokers in home Alcohol intake: 1-2 drinks per day  But not everyday. Discussed max 7 per week for females recommended.   Risk Factors Regular exercise: Walking 60 minutes or more.  Diet:  reasonably healthy - even healthier after husbands MI Wt Readings from Last 3 Encounters:  07/08/19 141 lb 12.8 oz (64.3 kg)  12/12/18 140 lb 9.6 oz (63.8 kg)  11/05/18 142 lb 6.4 oz (64.6 kg)   Fall Risk: None - 1 fall in last year (stepped off path to try to get off path as bikes coming and foot got caught up in netting)- education provided today Fall Risk  07/08/2019 07/08/2019 06/22/2018 06/07/2017 05/20/2016  Falls in the past year? 1 1 No No No  Number falls in past yr: 0 0 - - -  Injury with Fall? 0 0 - - -  Risk for fall due to : History of fall(s) - - - -  Follow up Education provided - - - -   Opioid use history:  no long term opioids use  Cardiac risk factors:  advanced age (older than 59 for men, 39 for women)  noted Hyperlipidemia - coronary calcium score of 0 though!  no Hypertension  No diabetes. - does have prediabetes though Lab Results  Component Value Date   HGBA1C 5.9 12/12/2018  Family History:   Heart attack in father in 25s  Depression Screen None. PHQ2 0  Depression screen [facility] 2/9 07/08/2019 12/12/2018 06/22/2018 06/07/2017 05/20/2016  Decreased Interest 0 0 0 0 0  Down, Depressed, Hopeless 0 0 0 0 0  PHQ - 2 Score 0 0 0 0 0   Activities of Daily Living Independent ADLs and IADLs   Hearing Difficulties: patient endorses occasional issues of asking people to repeat themselves. Encouraged her to go to [facility] to get tested.   Cognitive Testing             No reported major trouble.   Mini cog: normal clock draw. 2/3 delayed recall. Normal test result   leader,  season, table    List the Names of Other Physician/Practitioners you currently use: -Dr. Ubaldo Glassing, Dermatology -Dr. Phineas Real, OBGYN - Dr. Cruzita Lederer, Endocrinology - Dr. [name], Ophthalmology  Immunization History  Administered Date(s) Administered  . Influenza Whole 10/29/2009, 08/11/2010, 08/19/2011  . Influenza, High Dose Seasonal PF 09/29/2016, 08/21/2017  . Influenza,inj,Quad PF,6+ Mos 08/21/2013, 09/02/2014  . Influenza-Unspecified 09/09/2015, 08/01/2018  . Pneumococcal Conjugate-13 09/02/2014  . Pneumococcal Polysaccharide-23 03/06/2012  . Tdap 03/06/2012  . Zoster Recombinat (Shingrix) 03/10/2018, 05/10/2018   Required Immunizations needed today - no fully up to date- advised fall flu shot  Health Maintenance  Topic Date Due  . Flu Shot  06/29/2019  . Mammogram  09/14/2020  . Colon Cancer Screening  01/26/2022  . Tetanus Vaccine  03/06/2022  . DEXA scan (bone density measurement)  Completed  .  Hepatitis C: One time screening is recommended by Center for Disease Control  (CDC) for  adults born from [birth year] through 1965.   Completed  . Pneumonia vaccines  Completed   Screening tests-  1. Colon cancer screening- 01/2017 with 5 year repeat planned. Due to family history.  2. Lung Cancer screening- never smoker, not needed 3. Skin cancer screening- Dr. Ubaldo Glassing yearly  4. Cervical cancer screening- passed age based  screening- last pap 2019 was normal per her report 5. Breast cancer screening- with GYN and mammogram is up to date 09/14/18 normal  Advanced directives- full code, husband HCPOA   ROS- No pertinent positives discovered in course of AWV Ros- No chest pain or shortness of breath. No headache or blurry vision.   The following were reviewed and entered/updated in epic: Past Medical History:  Diagnosis Date  . Basal cell carcinoma of skin    left ankle, right thigh  . Bursitis    right hip  . Cancer [facility]) 2006   Breast/right  . Diverticulosis   . GERD  (gastroesophageal reflux disease)   . History of breast cancer/33 radiation treatments/ no chemo    2006  S/P RIGHT LUMPECTOMY AND RADIATION FOLLOWED BY DR RUBIN--  NO RECURRENCE  . History of kidney stones   . Hyperplastic colon polyp   . IBS (irritable bowel syndrome)   . Idiopathic peripheral neuropathy    bilateral tingling/ due to fall in [place]  . Internal hemorrhoid   . Kidney stone   . Leiomyoma    Multiple small  . Liver cyst 2006   Benign  . OA (osteoarthritis)   . Osteoporosis 2019   T score -2.5  . Personal history of radiation therapy   . Raynaud disease   . Renal calculus, left    non obstructive  . Rib pain on left side    able to lay on left side  . Rosacea   . Tinnitus    Patient Active Problem List   Diagnosis Date Noted  . Hyperlipidemia- february 2020 coronary Calcium score of 0 12/12/2018    Priority: Medium  . Rib pain on left side 03/21/2017    Priority: Medium  . Scoliosis 03/21/2017    Priority: Medium  . Osteoporosis 06/29/2009    Priority: Medium  . 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    Priority: Medium  . GERD 05/01/1999    Priority: Medium  . Hearing loss associated with syndrome of both ears 12/05/2016    Priority: Low  . Neuropathy 03/06/2012    Priority: Low  . COLONIC POLYPS 05/25/2006    Priority: Low  . Vitamin D insufficiency 08/13/2018  . Elevated glycosylated hemoglobin 08/13/2018   Past Surgical History:  Procedure Laterality Date  . BREAST BIOPSY Left 2016   benign  . BREAST EXCISIONAL BIOPSY Right 1973   benign  . BREAST LUMPECTOMY  2006    Right -- S/P RADIATION--  NO RECURRENCE  . DILATION AND CURETTAGE OF UTERUS    . EXTRACORPOREAL SHOCK WAVE LITHOTRIPSY     X2  . HYSTEROSCOPY    . HYSTEROSCOPY W/D&C  08/03/2012   Procedure: DILATATION AND CURETTAGE /HYSTEROSCOPY;  Surgeon: Anastasio Auerbach, MD;  Location: [place];  Service: Gynecology;  Laterality: N/A;  . SUPRANUMERARY NIPPLE  EXCISION  1973   CYST  . TONSILLECTOMY  AGE 54    Family History  Problem Relation Age of Onset  . Dementia Mother   . Depression Mother   . Osteoporosis Mother   . Stroke Father   . Heart attack Father 56  . Rectal cancer Maternal Grandmother   . Colon cancer Maternal Grandmother   . Stroke Sister        TIA. ? dementia  . Dementia Sister   . Breast cancer Neg Hx     Medications- reviewed and updated Current Outpatient Medications  Medication Sig Dispense Refill  .  Cholecalciferol (VITAMIN D PO) Take by mouth.    . Cyanocobalamin (VITAMIN B 12 PO) Take by mouth.    . denosumab (PROLIA) 60 MG/ML SOSY injection Inject 60 mg into the skin every 6 (six) months.    . esomeprazole (NEXIUM) 40 MG capsule TAKE ONE CAPSULE BY MOUTH EVERY DAY AT NOON 90 capsule 3   No current facility-administered medications for this visit.     Allergies-reviewed and updated Allergies  Allergen Reactions  . Amoxicillin Diarrhea    GI upset    Social History   Socioeconomic History  . Marital status: Married    Spouse name: Not on file  . Number of children: 1  . Years of education: Not on file  . Highest education level: Not on file  Occupational History  . Occupation: Firefighter  Social Needs  . Financial resource strain: Not on file  . Food insecurity    Worry: Not on file    Inability: Not on file  . Transportation needs    Medical: Not on file    Non-medical: Not on file  Tobacco Use  . Smoking status: Never Smoker  . Smokeless tobacco: Never Used  Substance and Sexual Activity  . Alcohol use: Yes    Alcohol/week: 6.0 standard drinks    Types: 6 Standard drinks or equivalent per week  . Drug use: No  . Sexual activity: Yes    Birth control/protection: Post-menopausal    Comment: 1st intercourse 73 yo-Fewer than 5 partners  Lifestyle  . Physical activity    Days per week: Not on file    Minutes per session: Not on file  . Stress: Not on file  Relationships  .  Social Herbalist on phone: Not on file    Gets together: Not on file    Attends religious service: Not on file    Active member of club or organization: Not on file    Attends meetings of clubs or organizations: Not on file    Relationship status: Not on file  Other Topics Concern  . Not on file  Social History Narrative   Married (husband patient of Dr. Yong Channel). 1 daughter. 1 granddaughter.       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       Hobbies: reading, travel, time with granddaughter. Very active      Objective:  BP 120/72 (BP Location: Left Arm, Patient Position: Sitting, Cuff Size: Normal)   Pulse 79   Temp 98.2 F (36.8 C) (Oral)   Ht 5' 7.5" (1.715 m)   Wt 141 lb 12.8 oz (64.3 kg)   LMP  (LMP Unknown)   SpO2 98%   BMI 21.88 kg/m  Gen: NAD, resting comfortably HEENT: Mucous membranes are moist. Oropharynx normal Neck: no thyromegaly CV: RRR no murmurs rubs or gallops Lungs: CTAB no crackles, wheeze, rhonchi Abdomen: soft/nontender/nondistended/normal bowel sounds. No rebound or guarding.  Ext: no edema Skin: warm, dry Neuro: grossly normal, moves all extremities, PERRLA   Assessment/Plan:  AWV completed- discussed recommended screenings and documented any personalized health advice and referrals for preventive counseling. See AVS as well which was given to patient.   Status of chronic or acute concerns   Prediabetes-Mildly elevated a1c. Discussed continuing healthy habits Lab Results  Component Value Date   HGBA1C 5.9 12/12/2018   HGBA1C 5.8 07/16/2018   Doing water walking starting in august- told her some concern as appears to be just at 6 feet and unable to wear  in the pool  Recommended follow up:  not fasting Future Appointments  Date Time Provider [place]  08/19/2019  9:00 AM Philemon Kingdom, MD [facility] None  09/16/2019  9:00 AM Fontaine, Belinda Block, MD [facility]um[facility]  12/18/2019  9:20 AM Marin Olp, MD [facility]     Lab/Order associations:   ICD-10-CM   1. Preventative health care  Z00.00    Return precautions advised. Garret Reddish, MD

## 2019-07-08 NOTE — Patient Instructions (Addendum)
  Dana Chambers , Thank you for taking time to come for your Medicare Wellness Visit. I appreciate your ongoing commitment to your health goals. Please review the following plan we discussed and let me know if I can assist you in the future.   These are the goals we discussed: 1. We should have flu shots available by September. Please strongly consider getting flu shot this year. If you get your flu shot at a pharmacy- please let us know. Get the high dose over 65 flu shot  This is a list of the screening recommended for you and due dates:  Health Maintenance  Topic Date Due  . Flu Shot  06/29/2019  . Mammogram  09/14/2020  . Colon Cancer Screening  01/26/2022  . Tetanus Vaccine  03/06/2022  . DEXA scan (bone density measurement)  Completed  .  Hepatitis C: One time screening is recommended by Center for Disease Control  (CDC) for  adults born from [birth year] through 1967.   Completed  . Pneumonia vaccines  Completed

## 2019-08-08 ENCOUNTER — Other Ambulatory Visit: Payer: Self-pay | Admitting: Gynecology

## 2019-08-08 DIAGNOSIS — Z1231 Encounter for screening mammogram for malignant neoplasm of breast: Secondary | ICD-10-CM

## 2019-08-15 ENCOUNTER — Other Ambulatory Visit: Payer: Self-pay

## 2019-08-19 ENCOUNTER — Other Ambulatory Visit: Payer: Self-pay

## 2019-08-19 ENCOUNTER — Ambulatory Visit (INDEPENDENT_AMBULATORY_CARE_PROVIDER_SITE_OTHER): Payer: Medicare Other | Admitting: Internal Medicine

## 2019-08-19 ENCOUNTER — Encounter: Payer: Self-pay | Admitting: Internal Medicine

## 2019-08-19 VITALS — BP 110/70 | HR 74 | Ht 66.5 in | Wt 142.0 lb

## 2019-08-19 DIAGNOSIS — R7309 Other abnormal glucose: Secondary | ICD-10-CM | POA: Diagnosis not present

## 2019-08-19 DIAGNOSIS — E559 Vitamin D deficiency, unspecified: Secondary | ICD-10-CM | POA: Diagnosis not present

## 2019-08-19 DIAGNOSIS — R0781 Pleurodynia: Secondary | ICD-10-CM

## 2019-08-19 DIAGNOSIS — M818 Other osteoporosis without current pathological fracture: Secondary | ICD-10-CM

## 2019-08-19 LAB — VITAMIN D 25 HYDROXY (VIT D DEFICIENCY, FRACTURES): VITD: 67.38 ng/mL (ref 30.00–100.00)

## 2019-08-19 LAB — HEMOGLOBIN A1C: Hgb A1c MFr Bld: 5.9 % (ref 4.6–6.5)

## 2019-08-19 NOTE — Patient Instructions (Signed)
Please continue vitamin D 2000 units daily.  Please stop at the lab.  Please come back for a follow-up appointment in 1 year. 

## 2019-08-19 NOTE — Progress Notes (Signed)
Patient ID: Dana Chambers, female   DOB: 09/23/1946, 73 y.o.   MRN: [id number]   HPI  Dana Chambers is a 73 y.o.-year-old female, initially referred by Dr. Phineas Real, for management of osteoporosis.  Last visit 1 year ago.  She had a fall in [place] last fall - hurt coccyx after stepping on a skateboard >> no fracture. She also fell on her knee this Spring >> no fracture.  Pt was dx with OP in 2006.  I reviewed patient's DXA scan reports: Date L1-L3 T score FN T score 33% distal Radius Ultra distal radius   10/17/2018 ([facility]) -0.4 (+12.6%*) RFN: -2.5 (+2.0%) LFN: -2.1 (+3.9%) -2.4 (-3.6%) n/a  10/12/2016 ([facility])  -1.4 (-2%)  RFN: -2.6 LFN: -2.4  -2.1 (-2.9%)  n/a  10/12/2015 ([facility])  -1.3 (+7.1%*)  RFN: -2.6 LFN: -2.7 -1.8  n/a  10/09/2013 ([facility])  -1.8  RFN: -2.5 LFN: -2.2 -3.0 (-14.9%*)  -4.5   09/07/2011 ([facility])  n/a RFN: -2.2   -3.1 (+3.0%)   08/26/2009 ([facility])  -1.1  RFN: -1.7 LFN: -1.9     No fractures.   She fell backwards (slipped) x1 >> on coccyx + hands >> no fxs, but developed traumatic carpal tunnel.   She denies dizziness, vertigo, orthostasis, poor vision.  Previous osteoporosis treatments - Bisphosphonates (Fosamax, Boniva) for approximately 10 years, followed by drug holiday - Prolia x 7 inj now - last 04/2019 (she had 4 injections up to 05/2017)  She had a slightly low vitamin D in 2018 after which we started supplementation Lab Results  Component Value Date   VD25OH 41.53 12/12/2018   VD25OH 40.81 08/13/2018   VD25OH 37.58 12/06/2017   VD25OH 26.10 (L) 08/10/2017   VD25OH 50 07/12/2011   VD25OH 55 06/28/2010   VD25OH 45 05/18/2009   VD25OH 37 10/21/2008   VD25OH 36 04/04/2008   She is on vitamin D 5000 units daily (increased this by herself from 2000 units daily at last visit).  She does weightbearing exercises-for the last 16 years: [employer] - walking 45-60 min twice a week, but not using weights anymore due to  upper back pain  Menopause was at 73 years old.  Pt does have a FH of osteoporosis: In mother, who also had dowagers hump. Mother's aunt also had this.  No history of hyper or hypocalcemia or hyperparathyroidism.  She has a history of kidney stones and had lithotripsy x2.  Lab Results  Component Value Date   PTH 48 08/10/2017   PTH Comment 08/10/2017   CALCIUM 9.4 12/12/2018   CALCIUM 9.8 08/13/2018   CALCIUM 9.3 12/06/2017   CALCIUM 9.4 08/10/2017   CALCIUM 9.2 11/29/2016   CALCIUM 9.3 11/12/2015   CALCIUM 9.0 10/02/2014   CALCIUM 9.0 09/02/2013   CALCIUM 9.5 09/02/2013   CALCIUM 9.8 08/23/2012   No history of thyrotoxicosis: Lab Results  Component Value Date   TSH 1.05 07/16/2018   TSH 1.05 11/29/2016   TSH 1.25 11/12/2015   TSH 1.11 10/02/2014   TSH 1.01 09/02/2013   TSH 1.08 09/02/2013   No CKD. Last BUN/Cr: Lab Results  Component Value Date   BUN 16 12/12/2018   CREATININE 0.84 12/12/2018   She had BrCA 2006, s/p RxTx, no ChTx >> she was on tamoxifen for 4.5 years.  She continues on Nexium delayed release 40 mg daily.  She also has a history of neuropathy - mostly in L leg.  She is on B12 supplements.  She has a history of elevated  HbA1c: Lab Results  Component Value Date   HGBA1C 5.9 12/12/2018   HGBA1C 5.8 07/16/2018   ROS: Constitutional: no weight gain/no weight loss, no fatigue, no subjective hyperthermia, no subjective hypothermia Eyes: no blurry vision, no xerophthalmia ENT: no sore throat, no nodules palpated in neck, no dysphagia, no odynophagia, no hoarseness Cardiovascular: no CP/no SOB/no palpitations/no leg swelling Respiratory: no cough/no SOB/no wheezing Gastrointestinal: no N/no V/no D/no C/+ acid reflux Musculoskeletal: no muscle aches/no joint aches Skin: no rashes, no hair loss Neurological: no tremors/no numbness/no tingling/no dizziness  I reviewed pt's medications, allergies, PMH, social hx, family hx, and changes were documented  in the history of present illness. Otherwise, unchanged from my initial visit note.  Past Medical History:  Diagnosis Date  . Basal cell carcinoma of skin    left ankle, right thigh  . Bursitis    right hip  . Cancer [facility]) 2006   Breast/right  . Diverticulosis   . GERD (gastroesophageal reflux disease)   . History of breast cancer/33 radiation treatments/ no chemo    2006  S/P RIGHT LUMPECTOMY AND RADIATION FOLLOWED BY DR RUBIN--  NO RECURRENCE  . History of kidney stones   . Hyperplastic colon polyp   . IBS (irritable bowel syndrome)   . Idiopathic peripheral neuropathy    bilateral tingling/ due to fall in [place]  . Internal hemorrhoid   . Kidney stone   . Leiomyoma    Multiple small  . Liver cyst 2006   Benign  . OA (osteoarthritis)   . Osteoporosis 2019   T score -2.5  . Personal history of radiation therapy   . Raynaud disease   . Renal calculus, left    non obstructive  . Rib pain on left side    able to lay on left side  . Rosacea   . Tinnitus    Past Surgical History:  Procedure Laterality Date  . BREAST BIOPSY Left 2016   benign  . BREAST EXCISIONAL BIOPSY Right 1973   benign  . BREAST LUMPECTOMY  2006    Right -- S/P RADIATION--  NO RECURRENCE  . DILATION AND CURETTAGE OF UTERUS    . EXTRACORPOREAL SHOCK WAVE LITHOTRIPSY     X2  . HYSTEROSCOPY    . HYSTEROSCOPY W/D&C  08/03/2012   Procedure: DILATATION AND CURETTAGE /HYSTEROSCOPY;  Surgeon: Anastasio Auerbach, MD;  Location: [place];  Service: Gynecology;  Laterality: N/A;  . SUPRANUMERARY NIPPLE EXCISION  1973   CYST  . TONSILLECTOMY  AGE 73   Social History   Social History  . Marital status: Married    Spouse name: N/A  . Number of children: 1   Occupational History  . Retired- Psychologist, prison and probation services Also, used to sing >> now hoarseness   Social History Main Topics  . Smoking status: Never Smoker  . Smokeless tobacco: Never Used  . Alcohol use 3.6 oz/week    6 Standard drinks  or equivalent per week  . Drug use: No   Social History Narrative   Married (husband patient of Dr. Yong Channel). 1 daughter. 1 granddaughter.       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       Hobbies: reading, travel, time with granddaughter. Very active   Current Outpatient Medications on File Prior to Visit  Medication Sig Dispense Refill  . Cholecalciferol (VITAMIN D PO) Take by mouth.    . Cyanocobalamin (VITAMIN B 12 PO) Take by mouth.    . denosumab (PROLIA)  60 MG/ML SOSY injection Inject 60 mg into the skin every 6 (six) months.    . esomeprazole (NEXIUM) 40 MG capsule TAKE ONE CAPSULE BY MOUTH EVERY DAY AT NOON 90 capsule 3   No current facility-administered medications on file prior to visit.    Allergies  Allergen Reactions  . Amoxicillin Diarrhea    GI upset   Family History  Problem Relation Age of Onset  . Dementia Mother   . Depression Mother   . Osteoporosis Mother   . Stroke Father   . Heart attack Father 62  . Rectal cancer Maternal Grandmother   . Colon cancer Maternal Grandmother   . Stroke Sister        TIA. ? dementia  . Dementia Sister   . Breast cancer Neg Hx     PE: BP 110/70   Pulse 74   Ht 5' 6.5" (1.689 m) Comment: measured today without shoes  Wt 142 lb (64.4 kg)   LMP  (LMP Unknown)   BMI 22.58 kg/m  Wt Readings from Last 3 Encounters:  08/19/19 142 lb (64.4 kg)  07/08/19 141 lb 12.8 oz (64.3 kg)  12/12/18 140 lb 9.6 oz (63.8 kg)   Constitutional: normal weight, in NAD Eyes: PERRLA, EOMI, no exophthalmos ENT: moist mucous membranes, no thyromegaly, no cervical lymphadenopathy Cardiovascular: RRR, No MRG Respiratory: CTA B Gastrointestinal: abdomen soft, NT, ND, BS+ Musculoskeletal: no deformities, strength intact in all 4 Skin: moist, warm, no rashes Neurological: no tremor with outstretched hands, DTR normal in all 4  Assessment: 1. Osteoporosis Lancet Diabetes Endocrinol. 2017 Jul;5(7):513-523. doi: 10.1016/S2213-8587(17)30138-9.  Epub 2017 May 22.  10 years of denosumab treatment in postmenopausal women with osteoporosis: results from the phase 3 randomised FREEDOM trial and open-label extension. Bone HG et al.  Denosumab treatment for up to 10 years was associated with low rates of adverse events, low fracture incidence compared with that observed during the original trial, and continued increases in BMD without plateau.  - she denies fractures but she has a decrease in height from 5 ft 8 in to 5 ft 6.5 in over the years  2.  Vitamin D insufficiency  3.  Elevated HbA1c  Plan: 1. Osteoporosis -Likely age-related/postmenopausal.  She also has a family history of osteoporosis -She continues to be fracture free -We again discussed above fall precautions  -We reviewed together her latest bone density scan obtained after last visit, on 10/17/2018: The L femoral neck T score was stable, the R FN T-score and the spine T-score increased significantly.  Overall, excellent results and I advised her to continue with Prolia. -So far, she had 7 Prolia injections, last in 04/2019. -She tolerates Prolia well, without jaw, hip, thigh pain -We can continue with Prolia at least 6-10 years -We again discussed that after we finished Prolia, we need to give her at least a year ago bisphosphonates, most likely Reclast if she has a history of GERD -At today's visit, we will check a vitamin D and a BMP. -We will check another bone density scan in 09/2020 -I will see her back in a year  2.  Vitamin D insufficiency -Last vitamin D level was normal in 11/2018 -She continues 2000 his vitamin D daily -We will check another level today  3.  Elevated HbA1c -In the prediabetic range: Lab Results  Component Value Date   HGBA1C 5.9 12/12/2018   HGBA1C 5.8 07/16/2018  -At last visit we discussed about reducing fatty foods to improve her insulin  resistance and she also has a high LDL, at 175).  At that time, strongly advised her to cut down  cheese, eggs, bacon, meat. -We checked another HbA1c today  Component     Latest Ref Rng & Units 08/19/2019  Glucose     65 - 99 mg/dL 94  BUN     7 - 25 mg/dL 17  Creatinine     0.60 - 0.93 mg/dL 0.84  GFR, Est Non African American     > OR = 60 mL/min/1.69m 68  GFR, Est African American     > OR = 60 mL/min/1.733m79  BUN/Creatinine Ratio     6 - 22 (calc) NOT APPLICABLE  Sodium     13492 146 mmol/L 141  Potassium     3.5 - 5.3 mmol/L 5.4 (H)  Chloride     98 - 110 mmol/L 103  CO2     20 - 32 mmol/L 28  Calcium     8.6 - 10.4 mg/dL 9.7  VITD     30.00 - 100.00 ng/mL 67.38  Hemoglobin A1C     4.6 - 6.5 % 5.9   HbA1c is excellent, stable. Vitamin D level is very good.  I will advise her to continue the current supplement dose. Potassium is slightly high.  Will need to recheck.  CrPhilemon KingdomMD PhD [facility]

## 2019-08-20 ENCOUNTER — Other Ambulatory Visit: Payer: Self-pay | Admitting: Internal Medicine

## 2019-08-20 DIAGNOSIS — E875 Hyperkalemia: Secondary | ICD-10-CM

## 2019-08-20 LAB — BASIC METABOLIC PANEL WITH GFR
BUN: 17 mg/dL (ref 7–25)
CO2: 28 mmol/L (ref 20–32)
Calcium: 9.7 mg/dL (ref 8.6–10.4)
Chloride: 103 mmol/L (ref 98–110)
Creat: 0.84 mg/dL (ref 0.60–0.93)
GFR, Est African American: 79 mL/min/{1.73_m2} (ref >=60)
GFR, Est Non African American: 68 mL/min/{1.73_m2} (ref >=60)
Glucose, Bld: 94 mg/dL (ref 65–99)
Potassium: 5.4 mmol/L — ABNORMAL HIGH (ref 3.5–5.3)
Sodium: 141 mmol/L (ref 135–146)

## 2019-08-21 ENCOUNTER — Encounter: Payer: Self-pay | Admitting: Internal Medicine

## 2019-08-22 ENCOUNTER — Other Ambulatory Visit (INDEPENDENT_AMBULATORY_CARE_PROVIDER_SITE_OTHER): Payer: Medicare Other

## 2019-08-22 ENCOUNTER — Other Ambulatory Visit: Payer: Self-pay

## 2019-08-22 DIAGNOSIS — E875 Hyperkalemia: Secondary | ICD-10-CM | POA: Diagnosis not present

## 2019-08-22 LAB — POTASSIUM: Potassium: 4.5 mEq/L (ref 3.5–5.1)

## 2019-09-05 ENCOUNTER — Encounter: Payer: Self-pay | Admitting: Gynecology

## 2019-09-13 ENCOUNTER — Other Ambulatory Visit: Payer: Self-pay

## 2019-09-16 ENCOUNTER — Other Ambulatory Visit: Payer: Self-pay

## 2019-09-16 ENCOUNTER — Ambulatory Visit: Payer: Medicare Other | Admitting: Gynecology

## 2019-09-16 ENCOUNTER — Encounter: Payer: Self-pay | Admitting: Gynecology

## 2019-09-16 VITALS — BP 120/78 | Ht 66.5 in | Wt 143.0 lb

## 2019-09-16 DIAGNOSIS — M810 Age-related osteoporosis without current pathological fracture: Secondary | ICD-10-CM | POA: Diagnosis not present

## 2019-09-16 DIAGNOSIS — Z01419 Encounter for gynecological examination (general) (routine) without abnormal findings: Secondary | ICD-10-CM | POA: Diagnosis not present

## 2019-09-16 DIAGNOSIS — N952 Postmenopausal atrophic vaginitis: Secondary | ICD-10-CM | POA: Diagnosis not present

## 2019-09-16 NOTE — Patient Instructions (Signed)
Follow-up in 1 year for annual exam, sooner as needed. 

## 2019-09-16 NOTE — Progress Notes (Signed)
    Dana Chambers 06-22-1946 [id number]        72 y.o.  G1P1001 for annual gynecologic exam.  Without gynecologic complaints  Past medical history,surgical history, problem list, medications, allergies, family history and social history were all reviewed and documented as reviewed in the EPIC chart.  ROS:  Performed with pertinent positives and negatives included in the history, assessment and plan.   Additional significant findings : None   Exam: Dana Chambers assistant Vitals:   09/16/19 0856  BP: 120/78  Weight: 143 lb (64.9 kg)  Height: 5' 6.5" (1.689 m)   Body mass index is 22.74 kg/m.  General appearance:  Normal affect, orientation and appearance. Skin: Grossly normal HEENT: Without gross lesions.  No cervical or supraclavicular adenopathy. Thyroid normal.  Lungs:  Clear without wheezing, rales or rhonchi Cardiac: RR, without RMG Abdominal:  Soft, nontender, without masses, guarding, rebound, organomegaly or hernia Breasts:  Examined lying and sitting.  Left without masses, retractions, discharge or axillary adenopathy.  Right status post lumpectomy with nipple resection.  No masses or axillary adenopathy Pelvic:  Ext, BUS, Vagina: With atrophic changes  Cervix: With atrophic changes  Uterus: Anteverted, normal size, shape and contour, midline and mobile nontender   Adnexa: Without masses or tenderness    Anus and perineum: Normal   Rectovaginal: Normal sphincter tone without palpated masses or tenderness.    Assessment/Plan:  73 y.o. G57P1001 female for annual gynecologic exam.   1. Postmenopausal.  No significant menopausal symptoms or any vaginal bleeding. 2. History of right breast cancer.  Exam NED.  Has mammography scheduled and she will follow-up for this. 3. Osteoporosis.  DEXA 2019 T score -2.5 on Prolia.  Followed by Dr. Cruzita Lederer 4. Colonoscopy 2018.  Repeat at their recommended interval. 5. Pap smear 2019.  No Pap smear done today.  No history of abnormal  Pap smears.  Options to stop screening per current screening guidelines reviewed.  Will readdress on an annual basis. 6. Health maintenance.  No routine lab work done as patient does this elsewhere.  Follow-up 1 year, sooner as needed.   Anastasio Auerbach MD, 9:30 AM 09/16/2019

## 2019-09-17 ENCOUNTER — Ambulatory Visit
Admission: RE | Admit: 2019-09-17 | Discharge: 2019-09-17 | Disposition: A | Discharge status: Home / Self Care | Payer: Medicare Other | Source: Ambulatory Visit | Attending: Gynecology | Admitting: Gynecology

## 2019-09-17 DIAGNOSIS — Z1231 Encounter for screening mammogram for malignant neoplasm of breast: Secondary | ICD-10-CM

## 2019-10-22 ENCOUNTER — Telehealth: Payer: Self-pay

## 2019-10-22 NOTE — Telephone Encounter (Signed)
LVM requesting patient call back to schedule Prolia - she owes $40 at check-in and can be scheduled on or after 11/21/19

## 2019-11-26 ENCOUNTER — Other Ambulatory Visit: Payer: Self-pay

## 2019-11-26 ENCOUNTER — Ambulatory Visit: Payer: Medicare Other

## 2019-11-26 DIAGNOSIS — M818 Other osteoporosis without current pathological fracture: Secondary | ICD-10-CM

## 2019-11-26 MED ORDER — DENOSUMAB 60 MG/ML ~~LOC~~ SOSY
60.0000 mg | PREFILLED_SYRINGE | Freq: Once | SUBCUTANEOUS | Status: AC
  Administered 2019-11-26: 60 mg via SUBCUTANEOUS

## 2019-11-26 NOTE — Progress Notes (Signed)
Per orders of Dr. Gherghe injection of Prolia given today by Ryszard Socarras, Certified Medical Assistant . Patient tolerated injection well.  

## 2019-12-17 NOTE — Progress Notes (Signed)
Phone: [phone]   Subjective:  Patient presents today for their annual physical. Chief complaint-noted.   See problem oriented charting- Review of Systems  Constitutional: Negative.   HENT: Positive for hearing loss and tinnitus.        On going   Eyes: Positive for pain.       Has issue with inside of left eye has app tomorrow.   Respiratory: Negative for cough, shortness of breath and wheezing.   Cardiovascular: Negative for chest pain, palpitations and leg swelling.  Gastrointestinal: Positive for abdominal pain and heartburn. Negative for blood in stool, constipation, nausea and vomiting.       Controlled with medications  LLQ for 3 months with gas, did have change in bowel movements but no blood. Has been seen by GI in past   Genitourinary: Negative.   Musculoskeletal: Negative.   Skin: Negative.   Neurological: Negative.   Endo/Heme/Allergies: Negative.   Psychiatric/Behavioral: Negative.    The following were reviewed and entered/updated in epic: Past Medical History:  Diagnosis Date  . Basal cell carcinoma of skin    left ankle, right thigh  . Bursitis    right hip  . Cancer [facility]) 2006   Breast/right  . Diverticulosis   . GERD (gastroesophageal reflux disease)   . History of breast cancer/33 radiation treatments/ no chemo    2006  S/P RIGHT LUMPECTOMY AND RADIATION FOLLOWED BY DR RUBIN--  NO RECURRENCE  . History of kidney stones   . Hyperplastic colon polyp   . IBS (irritable bowel syndrome)   . Idiopathic peripheral neuropathy    bilateral tingling/ due to fall in [place]  . Internal hemorrhoid   . Kidney stone   . Leiomyoma    Multiple small  . Liver cyst 2006   Benign  . OA (osteoarthritis)   . Osteoporosis 2019   T score -2.5  . Personal history of radiation therapy   . Raynaud disease   . Renal calculus, left    non obstructive  . Rib pain on left side    able to lay on left side  . Rosacea   . Tinnitus    Patient Active Problem List   Diagnosis Date Noted  . Hyperlipidemia- february 2020 coronary Calcium score of 0 12/12/2018    Priority: Medium  . Rib pain on left side 03/21/2017    Priority: Medium  . Scoliosis 03/21/2017    Priority: Medium  . Osteoporosis 06/29/2009    Priority: Medium  . 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    Priority: Medium  . GERD 05/01/1999    Priority: Medium  . Hearing loss associated with syndrome of both ears 12/05/2016    Priority: Low  . Neuropathy 03/06/2012    Priority: Low  . COLONIC POLYPS 05/25/2006    Priority: Low  . Vitamin D insufficiency 08/13/2018  . Elevated glycosylated hemoglobin 08/13/2018   Past Surgical History:  Procedure Laterality Date  . BREAST BIOPSY Left 2016   benign  . BREAST EXCISIONAL BIOPSY Right 1973   benign  . BREAST LUMPECTOMY  2006    Right -- S/P RADIATION--  NO RECURRENCE  . DILATION AND CURETTAGE OF UTERUS    . EXTRACORPOREAL SHOCK WAVE LITHOTRIPSY     X2  . HYSTEROSCOPY    . HYSTEROSCOPY WITH D & C  08/03/2012   Procedure: DILATATION AND CURETTAGE /HYSTEROSCOPY;  Surgeon: Anastasio Auerbach, MD;  Location: [place];  Service: Gynecology;  Laterality: N/A;  .  SUPRANUMERARY NIPPLE EXCISION  1973   CYST  . TONSILLECTOMY  AGE 22    Family History  Problem Relation Age of Onset  . Dementia Mother   . Depression Mother   . Osteoporosis Mother   . Stroke Father   . Heart attack Father 56  . Rectal cancer Maternal Grandmother   . Colon cancer Maternal Grandmother   . Stroke Sister        TIA. ? dementia. another stroke after covid then feeding issues led to eath  . Dementia Sister   . Breast cancer Neg Hx     Medications- reviewed and updated Current Outpatient Medications  Medication Sig Dispense Refill  . Cholecalciferol (VITAMIN D PO) Take by mouth.    . Cyanocobalamin (VITAMIN B 12 PO) Take by mouth.    . denosumab (PROLIA) 60 MG/ML SOSY injection Inject 60 mg into the skin every 6 (six) months.     . esomeprazole (NEXIUM) 40 MG capsule TAKE ONE CAPSULE BY MOUTH EVERY DAY AT NOON 90 capsule 3   No current facility-administered medications for this visit.    Allergies-reviewed and updated Allergies  Allergen Reactions  . Amoxicillin Diarrhea    GI upset    Social History   Social History Narrative   Married (husband patient of Dr. Yong Channel). 1 daughter. 1 granddaughter.       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       Hobbies: reading, travel, time with granddaughter. Very active   Objective  Objective:  BP 122/78   Pulse 89   Temp 98 F (36.7 C) (Temporal)   Ht 5\' 6"  (1.676 m)   Wt 143 lb 6.4 oz (65 kg)   LMP  (LMP Unknown)   SpO2 94%   BMI 23.15 kg/m  Gen: NAD, resting comfortably HEENT: Mask not removed due to covid 19. TM normal. Bridge of nose normal. Eyelids normal.  Neck: no thyromegaly or cervical lymphadenopathy  CV: RRR no murmurs rubs or gallops Lungs: CTAB no crackles, wheeze, rhonchi Abdomen: soft/nontender except mild tenderness with palpation in left lower quadrant/nondistended/normal bowel sounds. No rebound or guarding.  Ext: no edema Skin: warm, dry Neuro: grossly normal, moves all extremities, PERRLA   Assessment and Plan   74 y.o. female presenting for annual physical.  Health Maintenance counseling: 1. Anticipatory guidance: Patient counseled regarding regular dental exams q6 months, eye exams has one tomorrow ,  avoiding smoking and second hand smoke , limiting alcohol to 1 beverage per day . On average will have 1-2 glasses of wine with dinner - recommended cutting back to 1  2. Risk factor reduction:  Advised patient of need for regular exercise and diet rich and fruits and vegetables to reduce risk of heart attack and stroke. Exercise- Had been going to [facility] for water walking 2 times a week for 45 minutes- heater was broken but should be able to get back in soon. Diet- tries to eat healthy diet.   Wt Readings from Last 3 Encounters:    12/18/19 143 lb 6.4 oz (65 kg)  09/16/19 143 lb (64.9 kg)  08/19/19 142 lb (64.4 kg)  3. Immunizations/screenings/ancillary studies- vaccine for covid scheduled in 5 days Immunization History  Administered Date(s) Administered  . Influenza Whole 10/29/2009, 08/11/2010, 08/19/2011  . Influenza, High Dose Seasonal PF 09/29/2016, 08/21/2017, 08/01/2018, 07/15/2019  . Influenza,inj,Quad PF,6+ Mos 08/21/2013, 09/02/2014  . Influenza-Unspecified 09/09/2015, 08/01/2018, 07/17/2019  . Pneumococcal Conjugate-13 09/02/2014  . Pneumococcal Polysaccharide-23 03/06/2012  . Tdap 03/06/2012  .  Zoster Recombinat (Shingrix) 03/10/2018, 05/10/2018  4. Cervical cancer screening-past age based screening recommendations  5. Breast cancer screening-  breast exam monthly  and mammogram-completed 09/17/2019 send for notes.  History of Paget's disease of the breast surgery and radiation in 2006. 6. Colon cancer screening - due in 2023 with last done March 2018 7. Skin cancer screening- followed by dermatology  advised regular sunscreen use. Denies worrisome, changing, or new skin lesions.  8. Birth control/STD check- postmenopausal and monogamous 9. Osteoporosis screening at 65-see discussion below -never smoker  Status of chronic or acute concerns   Other osteoporosis without current pathological fracture-follows with Dr. Cruzita Lederer.  She is on Prolia with plan for at least 6 to 10 years total treatment.  Plan to use Reclast for a year after Prolia due to history of GERD. Vitamin D insufficiency-normal vitamin D in September.  Deferred today  Hyperlipidemia, unspecified hyperlipidemia type-patient prefers to avoid statin.  Update lipids and 10-year ASCVD risk.  Using last years labs risk is 15.3%-she wants to use 20% cut off before considering statin. Did have coronary CT with score of 0- still with CV issus in family is open to considering if risk gets above 20%.  Lab Results  Component Value Date   CHOL 285  (H) 12/12/2018   HDL 82.00 12/12/2018   LDLCALC 186 (H) 12/12/2018   LDLDIRECT 168.5 02/02/2012   TRIG 81.0 12/12/2018   CHOLHDL 3 12/12/2018    Gastroesophageal reflux disease, unspecified whether esophagitis present-compliant with Nexium 40 mg (rare breakthrough symptoms).  Has not tolerated 20 mg dose.  She is taking B12   Prediabetes-stable on last check-repeat blood work  Lab Results  Component Value Date   HGBA1C 5.9 08/19/2019   HGBA1C 5.9 12/12/2018   HGBA1C 5.8 07/16/2018   Left lower quadrant pain for 3 months. No blood in stool.  Has had some mild intermittent left abdominal pain and then since then has had a lot of rumbling/gas that is odorous in digestive tract. Had more frequent BMs. Gas-x was helpful. Has mild tenderness in left still but no regular pain. Bowel movements are less frequent now down to every 2-3 days. Worried about constriction but has continued to improve. Apparently when working with Dr. Olevia Perches of GI- thought possible IBS. She is going to trial align which has used in the past and if fails to improve she will let us know.with improving symptoms hesitant to do abdominal imaging. Could also have some constipation contributing so could try miralax 1 capful as needed.   Of note has had a lot of stress-Lost brother in law to glioblastoma. Sister had covid then had stroke and then died from complications from not being able to get nourishment.   Recommended follow up: Return in about 1 year (around 12/17/2020) for physical or sooner if needed. Future Appointments  Date Time Provider [place]  12/23/2019  9:45 AM [place] [facility]  08/17/2020  9:00 AM Philemon Kingdom, MD [facility] None  09/21/2020  9:30 AM Larey Days, MD [facility]   Lab/Order associations: fasting   ICD-10-CM   1. Preventative health care  Z00.00   2. Other osteoporosis without current pathological fracture  M81.8   3. Hyperlipidemia, unspecified  hyperlipidemia type  E78.5 CBC with Differential/Platelet    Comprehensive metabolic panel    Lipid panel  4. Vitamin D insufficiency  E55.9   5. Gastroesophageal reflux disease, unspecified whether esophagitis present  K21.9   6. History of  Paget's disease of breast  Z85.3   7. Hyperglycemia  R73.9 Hemoglobin A1c    Meds ordered this encounter  Medications  . esomeprazole (NEXIUM) 40 MG capsule    Sig: TAKE ONE CAPSULE BY MOUTH EVERY DAY AT NOON    Dispense:  90 capsule    Refill:  3   Return precautions advised.  Garret Reddish, MD

## 2019-12-18 ENCOUNTER — Encounter: Payer: Self-pay | Admitting: Family Medicine

## 2019-12-18 ENCOUNTER — Ambulatory Visit (INDEPENDENT_AMBULATORY_CARE_PROVIDER_SITE_OTHER): Payer: Medicare PPO | Admitting: Family Medicine

## 2019-12-18 VITALS — BP 122/78 | HR 89 | Temp 98.0°F | Ht 66.0 in | Wt 143.4 lb

## 2019-12-18 DIAGNOSIS — R739 Hyperglycemia, unspecified: Secondary | ICD-10-CM

## 2019-12-18 DIAGNOSIS — M818 Other osteoporosis without current pathological fracture: Secondary | ICD-10-CM | POA: Diagnosis not present

## 2019-12-18 DIAGNOSIS — E559 Vitamin D deficiency, unspecified: Secondary | ICD-10-CM

## 2019-12-18 DIAGNOSIS — Z853 Personal history of malignant neoplasm of breast: Secondary | ICD-10-CM | POA: Diagnosis not present

## 2019-12-18 DIAGNOSIS — E785 Hyperlipidemia, unspecified: Secondary | ICD-10-CM

## 2019-12-18 DIAGNOSIS — Z0000 Encounter for general adult medical examination without abnormal findings: Secondary | ICD-10-CM | POA: Diagnosis not present

## 2019-12-18 DIAGNOSIS — K219 Gastro-esophageal reflux disease without esophagitis: Secondary | ICD-10-CM

## 2019-12-18 LAB — COMPREHENSIVE METABOLIC PANEL
ALT: 12 U/L (ref 0–35)
AST: 17 U/L (ref 0–37)
Albumin: 4.7 g/dL (ref 3.5–5.2)
Alkaline Phosphatase: 59 U/L (ref 39–117)
BUN: 17 mg/dL (ref 6–23)
CO2: 31 mEq/L (ref 19–32)
Calcium: 9.7 mg/dL (ref 8.4–10.5)
Chloride: 101 mEq/L (ref 96–112)
Creatinine, Ser: 0.83 mg/dL (ref 0.40–1.20)
GFR: 66.97 mL/min (ref >60.00)
Glucose, Bld: 101 mg/dL — ABNORMAL HIGH (ref 70–99)
Potassium: 5.1 mEq/L (ref 3.5–5.1)
Sodium: 139 mEq/L (ref 135–145)
Total Bilirubin: 0.5 mg/dL (ref 0.2–1.2)
Total Protein: 7 g/dL (ref 6.0–8.3)

## 2019-12-18 LAB — LIPID PANEL
Cholesterol: 308 mg/dL — ABNORMAL HIGH (ref 0–200)
HDL: 77.9 mg/dL (ref >39.00)
LDL Cholesterol: 209 mg/dL — ABNORMAL HIGH (ref 0–99)
NonHDL: 229.74
Total CHOL/HDL Ratio: 4
Triglycerides: 106 mg/dL (ref 0.0–149.0)
VLDL: 21.2 mg/dL (ref 0.0–40.0)

## 2019-12-18 LAB — CBC WITH DIFFERENTIAL/PLATELET
Basophils Absolute: 0 10*3/uL (ref 0.0–0.1)
Basophils Relative: 0.6 % (ref 0.0–3.0)
Eosinophils Absolute: 0.1 10*3/uL (ref 0.0–0.7)
Eosinophils Relative: 1.7 % (ref 0.0–5.0)
HCT: 44.9 % (ref 36.0–46.0)
Hemoglobin: 14.8 g/dL (ref 12.0–15.0)
Lymphocytes Relative: 38.9 % (ref 12.0–46.0)
Lymphs Abs: 2.7 10*3/uL (ref 0.7–4.0)
MCHC: 33 g/dL (ref 30.0–36.0)
MCV: 91.2 fl (ref 78.0–100.0)
Monocytes Absolute: 0.5 10*3/uL (ref 0.1–1.0)
Monocytes Relative: 6.8 % (ref 3.0–12.0)
Neutro Abs: 3.6 10*3/uL (ref 1.4–7.7)
Neutrophils Relative %: 52 % (ref 43.0–77.0)
Platelets: 325 10*3/uL (ref 150.0–400.0)
RBC: 4.93 Mil/uL (ref 3.87–5.11)
RDW: 13.2 % (ref 11.5–15.5)
WBC: 7 10*3/uL (ref 4.0–10.5)

## 2019-12-18 LAB — HEMOGLOBIN A1C: Hgb A1c MFr Bld: 5.7 % (ref 4.6–6.5)

## 2019-12-18 MED ORDER — ESOMEPRAZOLE MAGNESIUM 40 MG PO CPDR: DELAYED_RELEASE_CAPSULE | ORAL | 3 refills | Status: DC

## 2019-12-18 NOTE — Patient Instructions (Addendum)
If abdominal issues do not continue to improve with gas x as needed, miralax as needed for constipation, and probiotics please see [place] back.  Please stop by lab before you go If you do not have mychart- we will call you about results within 5 business days of [place] receiving them.  If you have mychart- we will send your results within 3 business days of [place] receiving them.  If abnormal or we want to clarify a result, we will call or mychart you to make sure you receive the message.  If you have questions or concerns or don't hear within 5-7 days, please send [place] a message or call us.   Recommended follow up: Return in about 1 year (around 12/17/2020) for physical or sooner if needed.

## 2019-12-19 ENCOUNTER — Other Ambulatory Visit: Payer: Self-pay

## 2019-12-19 MED ORDER — ROSUVASTATIN CALCIUM 10 MG PO TABS: 10.0000 mg | ORAL_TABLET | ORAL | 3 refills | Status: DC

## 2019-12-23 ENCOUNTER — Ambulatory Visit: Payer: Medicare PPO | Attending: Internal Medicine

## 2019-12-23 DIAGNOSIS — Z23 Encounter for immunization: Secondary | ICD-10-CM

## 2019-12-23 NOTE — Progress Notes (Signed)
   Covid-19 Vaccination Clinic  Name:  Dana Chambers    MRN: [id number] DOB: [birth year]  12/23/2019  [name] was observed post Covid-19 immunization for 15 minutes without incidence. She was provided with Vaccine Information Sheet and instruction to access the V-Safe system.   [name] was instructed to call 911 with any severe reactions post vaccine: Marland Kitchen Difficulty breathing  . Swelling of your face and throat  . A fast heartbeat  . A bad rash all over your body  . Dizziness and weakness    Immunizations Administered    Name Date Dose VIS Date Route   Pfizer COVID-19 Vaccine 12/23/2019  9:39 AM 0.3 mL 11/08/2019 Intramuscular   Manufacturer: [place]   Lot: [id number]   [place]: [id number]

## 2020-01-06 DIAGNOSIS — H02825 Cysts of left lower eyelid: Secondary | ICD-10-CM | POA: Diagnosis not present

## 2020-01-06 DIAGNOSIS — H40013 Open angle with borderline findings, low risk, bilateral: Secondary | ICD-10-CM | POA: Diagnosis not present

## 2020-01-13 ENCOUNTER — Ambulatory Visit: Payer: Medicare PPO | Attending: Internal Medicine

## 2020-01-13 DIAGNOSIS — Z23 Encounter for immunization: Secondary | ICD-10-CM

## 2020-01-13 NOTE — Progress Notes (Signed)
   Covid-19 Vaccination Clinic  Name:  Dana Chambers    MRN: [id number] DOB: 07-14-1946  01/13/2020  Dana Chambers was observed post Covid-19 immunization for 15 minutes without incidence. She was provided with Vaccine Information Sheet and instruction to access the V-Safe system.   Dana Chambers was instructed to call 911 with any severe reactions post vaccine: Marland Kitchen Difficulty breathing  . Swelling of your face and throat  . A fast heartbeat  . A bad rash all over your body  . Dizziness and weakness    Immunizations Administered    Name Date Dose VIS Date Route   Pfizer COVID-19 Vaccine 01/13/2020 10:04 AM 0.3 mL 11/08/2019 Intramuscular   Manufacturer: [place]   Lot: [id number]   [place]: [id number]

## 2020-01-22 ENCOUNTER — Other Ambulatory Visit: Payer: Self-pay

## 2020-01-22 ENCOUNTER — Encounter: Payer: Self-pay | Admitting: Family Medicine

## 2020-01-22 ENCOUNTER — Ambulatory Visit (INDEPENDENT_AMBULATORY_CARE_PROVIDER_SITE_OTHER): Payer: Medicare PPO | Admitting: Family Medicine

## 2020-01-22 ENCOUNTER — Telehealth: Payer: Self-pay

## 2020-01-22 VITALS — BP 138/80 | HR 75 | Temp 98.0°F | Ht 66.0 in | Wt 142.8 lb

## 2020-01-22 DIAGNOSIS — E785 Hyperlipidemia, unspecified: Secondary | ICD-10-CM

## 2020-01-22 DIAGNOSIS — L729 Follicular cyst of the skin and subcutaneous tissue, unspecified: Secondary | ICD-10-CM | POA: Diagnosis not present

## 2020-01-22 NOTE — Patient Instructions (Addendum)
Looks like a small cyst on left lower leg- I do not think this is related to prior basal cell. I still think asking Dr. Ubaldo Glassing is reasonable at next visit but this is not a rush. If area enlarges significantly or causes worsening pain or you have calf pain or shortness of breath- return to see [place] ASAP  Great to see you today! Have a wonderful and safe trip

## 2020-01-22 NOTE — Telephone Encounter (Signed)
Ok to use SDA.

## 2020-01-22 NOTE — Progress Notes (Signed)
Phone [phone] In person visit   Subjective:   Dana Chambers is a 74 y.o. year old very pleasant female patient who presents for/with See problem oriented charting Chief Complaint  Patient presents with  . Follow-up  . knot on ankle   This visit occurred during the SARS-CoV-2 public health emergency.  Safety protocols were in place, including screening questions prior to the visit, additional usage of staff PPE, and extensive cleaning of exam room while observing appropriate contact time as indicated for disinfecting solutions.   Past Medical History-  Patient Active Problem List   Diagnosis Date Noted  . Hyperlipidemia- february 2020 coronary Calcium score of 0 12/12/2018    Priority: Medium  . Rib pain on left side 03/21/2017    Priority: Medium  . Scoliosis 03/21/2017    Priority: Medium  . Osteoporosis 06/29/2009    Priority: Medium  . 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    Priority: Medium  . GERD 05/01/1999    Priority: Medium  . Hearing loss associated with syndrome of both ears 12/05/2016    Priority: Low  . Neuropathy 03/06/2012    Priority: Low  . COLONIC POLYPS 05/25/2006    Priority: Low  . Vitamin D insufficiency 08/13/2018  . Elevated glycosylated hemoglobin 08/13/2018    Medications- reviewed and updated Current Outpatient Medications  Medication Sig Dispense Refill  . Cholecalciferol (VITAMIN D PO) Take by mouth.    . Cyanocobalamin (VITAMIN B 12 PO) Take by mouth.    . denosumab (PROLIA) 60 MG/ML SOSY injection Inject 60 mg into the skin every 6 (six) months.    . esomeprazole (NEXIUM) 40 MG capsule TAKE ONE CAPSULE BY MOUTH EVERY DAY AT NOON 90 capsule 3  . rosuvastatin (CRESTOR) 10 MG tablet Take 1 tablet (10 mg total) by mouth once a week. 13 tablet 3   No current facility-administered medications for this visit.     Objective:  BP 138/80   Pulse 75   Temp 98 F (36.7 C)   Ht 5\' 6"  (1.676 m)   Wt 142 lb 12.8 oz (64.8 kg)    LMP  (LMP Unknown)   SpO2 100%   BMI 23.05 kg/m  Gen: NAD, resting comfortably Ext: no edema, patient with slight scar on left lower extremity-about a centimeter or 2 up from this a subcutaneous nodule less than a centimeter by centimeter is noted-freely mobile and not painful to touch Skin: warm, dry    Assessment and Plan  Knot on ankle S: pt c/o sensitive knot on the inside of her left ankle. She states in 03/2017 she has a precancerous lesion removed below that area.  She notes a small raised area underneath the skin that is not particularly painful to touch but is noticeable  A/P: This appears to be a subcutaneous cyst-discussed I did not think this was related to prior basal cell skin cancer.  Patient has follow-up visit with dermatology within 6 months and I did recommend she discuss with dermatologist but once again I do believe this is a benign cyst-if she has new or worsening symptoms she agrees to follow-up with [place] sooner than that dermatology visit (or could see dermatology).  She also asked about blood clots and reassured her no evidence of blood clots but if she has rapid expansion of area, calf pain, shortness of breath should let us know immediately.   #hyperlipidemia S: compliant with rosuvastatin 10 mg once a week without side effects Lab Results  Component Value Date   CHOL 308 (H) 12/18/2019   HDL 77.90 12/18/2019   LDLCALC 209 (H) 12/18/2019   LDLDIRECT 168.5 02/02/2012   TRIG 106.0 12/18/2019   CHOLHDL 4 12/18/2019   A/P: Poor control last check-hopefully improved with once a week statin.  Fortunately she had a coronary calcium score of 0 on January 16, 2019 but we are doing this more for long-term prevention.  We discussed possible wellness visit in August but ultimately she decided to combine wellness visit and physical on January 24 visit   Recommended follow up: Physical and annual wellness visit December 21, 2020 Future Appointments  Date Time Provider  [place]  08/17/2020  9:00 AM Philemon Kingdom, MD [facility] None  09/21/2020  9:30 AM Joseph Pierini, MD [facility]  12/21/2020 10:40 AM Marin Olp, MD [facility]    Lab/Order associations:   ICD-10-CM   1. Subcutaneous cyst  L72.9   2. Hyperlipidemia, unspecified hyperlipidemia type  E78.5     Time Spent: 21 minutes of total time (11:27 AM- 11:44 AM, 5:25-5:31 PM) was spent on the date of the encounter performing the following actions: chart review prior to seeing the patient, obtaining history, performing a medically necessary exam, counseling on the treatment plan, placing orders, and documenting in our EHR.   Return precautions advised.  Garret Reddish, MD

## 2020-01-22 NOTE — Telephone Encounter (Signed)
Patient is calling because a knot has appeared on her ankle. Patient states that its about the size of a dime. Patient states that its not urgent but she wanted Dr. Yong Channel to take a look at it before she go out of town. Patient leaves March 2nd. The next available appt is March 3rd 2021. We do have a same day appt for today.

## 2020-02-25 DIAGNOSIS — L723 Sebaceous cyst: Secondary | ICD-10-CM | POA: Diagnosis not present

## 2020-02-25 DIAGNOSIS — L821 Other seborrheic keratosis: Secondary | ICD-10-CM | POA: Diagnosis not present

## 2020-02-25 DIAGNOSIS — Z85828 Personal history of other malignant neoplasm of skin: Secondary | ICD-10-CM | POA: Diagnosis not present

## 2020-04-13 ENCOUNTER — Telehealth: Payer: Self-pay

## 2020-04-13 NOTE — Telephone Encounter (Signed)
Patient has been submitted to [employer] Assist Portal to get summary of benefits. 

## 2020-05-01 ENCOUNTER — Encounter: Payer: Self-pay | Admitting: Family Medicine

## 2020-05-01 ENCOUNTER — Other Ambulatory Visit: Payer: Self-pay

## 2020-05-01 DIAGNOSIS — R739 Hyperglycemia, unspecified: Secondary | ICD-10-CM

## 2020-05-01 DIAGNOSIS — E785 Hyperlipidemia, unspecified: Secondary | ICD-10-CM

## 2020-05-06 ENCOUNTER — Other Ambulatory Visit: Payer: Self-pay

## 2020-05-06 ENCOUNTER — Other Ambulatory Visit (INDEPENDENT_AMBULATORY_CARE_PROVIDER_SITE_OTHER): Payer: Medicare PPO

## 2020-05-06 DIAGNOSIS — R739 Hyperglycemia, unspecified: Secondary | ICD-10-CM

## 2020-05-06 DIAGNOSIS — E785 Hyperlipidemia, unspecified: Secondary | ICD-10-CM | POA: Diagnosis not present

## 2020-05-06 LAB — LIPID PANEL
Cholesterol: 200 mg/dL (ref 0–200)
HDL: 72.4 mg/dL (ref >39.00)
LDL Cholesterol: 113 mg/dL — ABNORMAL HIGH (ref 0–99)
NonHDL: 127.39
Total CHOL/HDL Ratio: 3
Triglycerides: 72 mg/dL (ref 0.0–149.0)
VLDL: 14.4 mg/dL (ref 0.0–40.0)

## 2020-05-06 LAB — COMPREHENSIVE METABOLIC PANEL
ALT: 15 U/L (ref 0–35)
AST: 19 U/L (ref 0–37)
Albumin: 4.5 g/dL (ref 3.5–5.2)
Alkaline Phosphatase: 56 U/L (ref 39–117)
BUN: 17 mg/dL (ref 6–23)
CO2: 30 mEq/L (ref 19–32)
Calcium: 9.3 mg/dL (ref 8.4–10.5)
Chloride: 105 mEq/L (ref 96–112)
Creatinine, Ser: 0.83 mg/dL (ref 0.40–1.20)
GFR: 66.9 mL/min (ref >60.00)
Glucose, Bld: 92 mg/dL (ref 70–99)
Potassium: 5.1 mEq/L (ref 3.5–5.1)
Sodium: 142 mEq/L (ref 135–145)
Total Bilirubin: 0.4 mg/dL (ref 0.2–1.2)
Total Protein: 6.8 g/dL (ref 6.0–8.3)

## 2020-05-06 LAB — HEMOGLOBIN A1C: Hgb A1c MFr Bld: 5.7 % (ref 4.6–6.5)

## 2020-05-08 ENCOUNTER — Telehealth: Payer: Self-pay

## 2020-05-08 NOTE — Telephone Encounter (Signed)
Patient submitted to [employer] portal for Prolia verification. Awaiting summary of benefits.

## 2020-05-15 ENCOUNTER — Telehealth: Payer: Self-pay

## 2020-05-15 NOTE — Telephone Encounter (Signed)
PA initiated via [email or web address] for Prolia.   Gloris [place] Key: [id number] - PA Case ID: [id number] Need help? Call us at [phone] Status Sent to [place] 60MG /ML syringes Form Nurse, adult and Medical Benefit PA Form

## 2020-05-20 NOTE — Telephone Encounter (Signed)
Prolia Shot scheduled for 05/27/2020 @ 10:00 AM - patient insisted on scheduling shot even after being advised that the second part of the Prolia process (covermymeds) had not been fully completed.  Advised patient that we were waiting to schedule her pending getting the assistance information so that a cost could be determined for her.  Patient expressed understanding

## 2020-05-20 NOTE — Telephone Encounter (Signed)
PA has been approved.  Approvedon June 18 PA Case: [id number], Status: Approved, Coverage Starts on: 05/15/2020 12:00:00 AM, Coverage Ends on: 11/27/2020 12:00:00 AM. Questions? Contact [phone].

## 2020-05-20 NOTE — Telephone Encounter (Signed)
Pt called this office and spoke with a clerical staff member and stated that she has a letter from her insurance company that stated she has been approved for Prolia. This office did initiate a PA for this patient but has not yet received notification that she has been approved as of yet. At this time, this office also does not know fully what the patient's financial responsibility is for this drug. Patient was made aware of this numerous times while this phone call was taking place. Pt was placed on hold by clerical staff and staff member consulted with myself. Pt was then made aware that if this office does not complete verification, whether approved or not, the patient may incur a larger than expected bill, and it is most advisable to wait until the verification process is completed. Pt denied to do this and verbalized that she wanted to proceed and schedule this injection regardless of the efforts this office has made thus far.  For this reason, patient was scheduled and she is fully aware that she may incur charges for this injection from her insurance company that they will not pay because verification of copay information was not yet completed, and patient insisted on scheduled anyways.

## 2020-05-27 ENCOUNTER — Other Ambulatory Visit: Payer: Self-pay

## 2020-05-27 ENCOUNTER — Ambulatory Visit (INDEPENDENT_AMBULATORY_CARE_PROVIDER_SITE_OTHER): Payer: Medicare PPO

## 2020-05-27 DIAGNOSIS — M818 Other osteoporosis without current pathological fracture: Secondary | ICD-10-CM | POA: Diagnosis not present

## 2020-05-27 MED ORDER — DENOSUMAB 60 MG/ML ~~LOC~~ SOSY
60.0000 mg | PREFILLED_SYRINGE | Freq: Once | SUBCUTANEOUS | Status: AC
  Administered 2020-05-27: 60 mg via SUBCUTANEOUS

## 2020-05-27 NOTE — Progress Notes (Signed)
Per orders of Dr. Gherghe injection of Prolia given today by Taylor Spilde, Certified Medical Assistant . Patient tolerated injection well.  

## 2020-07-06 ENCOUNTER — Telehealth: Payer: Self-pay | Admitting: Family Medicine

## 2020-07-06 NOTE — Telephone Encounter (Signed)
Left message for patient to call back and schedule Medicare Annual Wellness Visit (AWV) either virtually/audio only OR in office. Whatever the patients preference is.  Last AWV 07/08/19; please schedule 07/08/20 OR AFTER with [facility] Health Advisor at [place].  This should be a 45 minute visit.

## 2020-07-22 DIAGNOSIS — H25013 Cortical age-related cataract, bilateral: Secondary | ICD-10-CM | POA: Diagnosis not present

## 2020-07-22 DIAGNOSIS — H2513 Age-related nuclear cataract, bilateral: Secondary | ICD-10-CM | POA: Diagnosis not present

## 2020-07-22 DIAGNOSIS — H0102A Squamous blepharitis right eye, upper and lower eyelids: Secondary | ICD-10-CM | POA: Diagnosis not present

## 2020-07-22 DIAGNOSIS — H40013 Open angle with borderline findings, low risk, bilateral: Secondary | ICD-10-CM | POA: Diagnosis not present

## 2020-07-28 NOTE — Patient Instructions (Addendum)
Health Maintenance Due  Topic Date Due  . INFLUENZA VACCINE -please let us know when you have gotten this.  06/28/2020   Patient's bowel movements have become more regular and not super firm.  With that being said I wonder if she still could have some backup of stool contributing to some of her abdominal discomfort.  We are going to try MiraLAX 1 capful daily every other day for 1 to 2 weeks to see if this makes a difference.  Thankfully she is up-to-date on her colonoscopy which is very reassuring and she will have repeat in 2023  left upper back pain after sleeping on hard pillow- this certainly seems muscular. massage therapy or heat up to 4 x a day for 15-20 minutes would be reasonable. Likely will resolve within a few weeks.  Could refer to sports medicine if doesn't resolve within 3 weeks  Team urine ordered

## 2020-07-28 NOTE — Progress Notes (Signed)
Phone [phone] In person visit   Subjective:   Dana Chambers is a 74 y.o. year old very pleasant female patient who presents for/with See problem oriented charting Chief Complaint  Patient presents with  . GI Problem  . Urinary Tract Infection    small light brown discharge in panties  . Ankle Pain    lump on left ankle    This visit occurred during the SARS-CoV-2 public health emergency.  Safety protocols were in place, including screening questions prior to the visit, additional usage of staff PPE, and extensive cleaning of exam room while observing appropriate contact time as indicated for disinfecting solutions.   Past Medical History-  Patient Active Problem List   Diagnosis Date Noted  . Hyperlipidemia- february 2020 coronary Calcium score of 0 12/12/2018    Priority: Medium  . Rib pain on left side 03/21/2017    Priority: Medium  . Scoliosis 03/21/2017    Priority: Medium  . Osteoporosis 06/29/2009    Priority: Medium  . 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    Priority: Medium  . GERD 05/01/1999    Priority: Medium  . Hearing loss associated with syndrome of both ears 12/05/2016    Priority: Low  . Neuropathy 03/06/2012    Priority: Low  . COLONIC POLYPS 05/25/2006    Priority: Low  . Vitamin D insufficiency 08/13/2018  . Elevated glycosylated hemoglobin 08/13/2018    Medications- reviewed and updated Current Outpatient Medications  Medication Sig Dispense Refill  . Cholecalciferol (VITAMIN D PO) Take by mouth.    . Cyanocobalamin (VITAMIN B 12 PO) Take by mouth.    . denosumab (PROLIA) 60 MG/ML SOSY injection Inject 60 mg into the skin every 6 (six) months.    . esomeprazole (NEXIUM) 40 MG capsule TAKE ONE CAPSULE BY MOUTH EVERY DAY AT NOON 90 capsule 3  . rosuvastatin (CRESTOR) 10 MG tablet Take 1 tablet (10 mg total) by mouth once a week. 13 tablet 3   No current facility-administered medications for this visit.     Objective:  BP  134/80   Pulse 81   Temp 98.5 F (36.9 C) (Temporal)   Ht 5\' 6"  (1.676 m)   Wt 140 lb 9.6 oz (63.8 kg)   LMP  (LMP Unknown)   SpO2 99%   BMI 22.69 kg/m  Gen: NAD, resting comfortably CV: RRR no murmurs rubs or gallops Lungs: CTAB no crackles, wheeze, rhonchi Abdomen: soft/nontender/nondistended/normal bowel sounds. No rebound or guarding.  Ext: no edema Skin: warm, dry Just above left ankle there is a <5 x 5 mm raised area that is nontender  Pending UA No results found for this or any previous visit (from the past 24 hour(s)).     Assessment and Plan   # Constipation/ some abdominal discomfort S:constipation of and on for a while- she reports recently better every 2 days and doesn't feel super firm. No blood or mucus in the stool noted. No thinning of stools. Occasional pain in LUQ if constipated. We previously had discussed probiotic and increase fiber intake- she also does fiber sometimes. Occasionally wakes up with rumbling in stomach.   Patient is up-to-date on colonoscopy last done January 26, 2017 with 5-year repeat planned due to tubular adenoma 11 diarrhea 75 A/P: Patient's bowel movements have become more regular and not super firm.  With that being said I wonder if she still could have some backup of stool contributing to some of her abdominal discomfort.  We  are going to try MiraLAX 1 capful daily every other day for 1 to 2 weeks to see if this makes a difference.  Thankfully she is up-to-date on her colonoscopy which is very reassuring and she will have repeat in 2023  -has bene under a lot of stress with delta which could contribute -prior testing with Dr. Olevia Perches for celiac and there was some concern for IBS  # Left upper Back pain S:traveled recently and did not take her own pillow- had some big firm pillows where she stayed and back started bothering -1.5 weeks ago. Left upper back discomfort.   A/P: left upper back pain after sleeping on hard pillow- this certainly  seems muscular. massage therapy or heat up to 4 x a day for 15-20 minutes would be reasonable. Likely will resolve within a few weeks.  Could refer to sports medicine if doesn't resolve within 3 weeks  # intermittent brown speck in underwear S:small light brown discharge in panties perhaps every 4 days that seems to be higher than would be related to any other discharge in the past. Sees Dr. Delilah Shan with [facility] in October. Has had kidney stones in past- wants to have a urine checked A/P: I agree with GYN follow up for this primarily- im also fine checking UA (under hyperlipidemia) . If blood in urine likely refer back to urology- has known stones in the past  # left ankle subcutaneous lesion S:off and on for a while.  A/P: less than 5 mmx 5 mm lesion above left ankle- could be cyst or neurofibroma or inflammed lipoma. Has seen dr Donne Hazel in past for prior issues and could be referred back if bothering her or wants to remove. Not increasing in size- reasonable to monitor.    Recommended follow up:  Keep January visit Future Appointments  Date Time Provider [place]  08/21/2020  9:20 AM Philemon Kingdom, MD [facility] None  09/21/2020  9:30 AM Joseph Pierini, MD [facility]  12/21/2020 10:40 AM Yong Channel Brayton Mars, MD [facility]   Lab/Order associations:   ICD-10-CM   1. Constipation, unspecified constipation type  K59.00   2. Upper back pain on left side  M54.9   3. Hyperlipidemia, unspecified hyperlipidemia type  E78.5 POCT Urinalysis Dipstick (Automated)   Time Spent: 25 minutes of total time (10:10 AM- 10:35 AM) was spent on the date of the encounter performing the following actions: chart review prior to seeing the patient, obtaining history, performing a medically necessary exam, counseling on the treatment plan, placing orders, and documenting in our EHR.   Return precautions advised.  Garret Reddish, MD

## 2020-07-29 ENCOUNTER — Encounter: Payer: Self-pay | Admitting: Family Medicine

## 2020-07-29 ENCOUNTER — Other Ambulatory Visit: Payer: Self-pay

## 2020-07-29 ENCOUNTER — Other Ambulatory Visit: Payer: Self-pay | Admitting: Obstetrics and Gynecology

## 2020-07-29 ENCOUNTER — Ambulatory Visit: Payer: Medicare PPO | Admitting: Family Medicine

## 2020-07-29 VITALS — BP 134/80 | HR 81 | Temp 98.5°F | Ht 66.0 in | Wt 140.6 lb

## 2020-07-29 DIAGNOSIS — E785 Hyperlipidemia, unspecified: Secondary | ICD-10-CM

## 2020-07-29 DIAGNOSIS — M549 Dorsalgia, unspecified: Secondary | ICD-10-CM | POA: Diagnosis not present

## 2020-07-29 DIAGNOSIS — K5900 Constipation, unspecified: Secondary | ICD-10-CM

## 2020-07-29 DIAGNOSIS — Z1231 Encounter for screening mammogram for malignant neoplasm of breast: Secondary | ICD-10-CM

## 2020-07-29 LAB — POC URINALSYSI DIPSTICK (AUTOMATED)
Bilirubin, UA: NEGATIVE
Blood, UA: NEGATIVE
Glucose, UA: NEGATIVE
Ketones, UA: NEGATIVE
Leukocytes, UA: NEGATIVE
Nitrite, UA: NEGATIVE
Protein, UA: NEGATIVE
Spec Grav, UA: 1.01 (ref 1.010–1.025)
Urobilinogen, UA: 0.2 E.U./dL
pH, UA: 6.5 (ref 5.0–8.0)

## 2020-07-29 NOTE — Addendum Note (Signed)
Addended by: Genevie Cheshire L on: 07/29/2020 11:58 AM   Modules accepted: Orders

## 2020-08-02 ENCOUNTER — Encounter: Payer: Self-pay | Admitting: Family Medicine

## 2020-08-17 ENCOUNTER — Ambulatory Visit: Payer: Medicare Other | Admitting: Internal Medicine

## 2020-08-18 ENCOUNTER — Ambulatory Visit: Payer: Medicare Other | Admitting: Internal Medicine

## 2020-08-20 DIAGNOSIS — D2271 Melanocytic nevi of right lower limb, including hip: Secondary | ICD-10-CM | POA: Diagnosis not present

## 2020-08-20 DIAGNOSIS — D692 Other nonthrombocytopenic purpura: Secondary | ICD-10-CM | POA: Diagnosis not present

## 2020-08-20 DIAGNOSIS — D2239 Melanocytic nevi of other parts of face: Secondary | ICD-10-CM | POA: Diagnosis not present

## 2020-08-20 DIAGNOSIS — L821 Other seborrheic keratosis: Secondary | ICD-10-CM | POA: Diagnosis not present

## 2020-08-20 DIAGNOSIS — D2262 Melanocytic nevi of left upper limb, including shoulder: Secondary | ICD-10-CM | POA: Diagnosis not present

## 2020-08-20 DIAGNOSIS — L814 Other melanin hyperpigmentation: Secondary | ICD-10-CM | POA: Diagnosis not present

## 2020-08-20 DIAGNOSIS — D1801 Hemangioma of skin and subcutaneous tissue: Secondary | ICD-10-CM | POA: Diagnosis not present

## 2020-08-20 DIAGNOSIS — D225 Melanocytic nevi of trunk: Secondary | ICD-10-CM | POA: Diagnosis not present

## 2020-08-20 DIAGNOSIS — D2261 Melanocytic nevi of right upper limb, including shoulder: Secondary | ICD-10-CM | POA: Diagnosis not present

## 2020-08-21 ENCOUNTER — Other Ambulatory Visit: Payer: Self-pay

## 2020-08-21 ENCOUNTER — Encounter: Payer: Self-pay | Admitting: Internal Medicine

## 2020-08-21 ENCOUNTER — Ambulatory Visit: Payer: Medicare PPO | Admitting: Internal Medicine

## 2020-08-21 VITALS — BP 130/82 | HR 89 | Ht 66.0 in | Wt 142.0 lb

## 2020-08-21 DIAGNOSIS — R7303 Prediabetes: Secondary | ICD-10-CM

## 2020-08-21 DIAGNOSIS — M818 Other osteoporosis without current pathological fracture: Secondary | ICD-10-CM

## 2020-08-21 DIAGNOSIS — E559 Vitamin D deficiency, unspecified: Secondary | ICD-10-CM

## 2020-08-21 LAB — HEMOGLOBIN A1C: Hgb A1c MFr Bld: 6.2 % (ref 4.6–6.5)

## 2020-08-21 NOTE — Progress Notes (Signed)
Patient ID: Dana Chambers, female   DOB: 05/10/1946, 74 y.o.   MRN: [id number]  This visit occurred during the SARS-CoV-2 public health emergency.  Safety protocols were in place, including screening questions prior to the visit, additional usage of staff PPE, and extensive cleaning of exam room while observing appropriate contact time as indicated for disinfecting solutions.   HPI  Dana Chambers is a 74 y.o.-year-old female, initially referred by Dr. Phineas Real, for management of osteoporosis.  Last visit 1 year ago.  Pt was dx with OP in 2006.  Reviewed patient's DXA scan report: Date L1-L3 T score FN T score 33% distal Radius Ultra distal radius   10/17/2018 ([facility]) -0.4 (+12.6%*) RFN: -2.5 (+2.0%) LFN: -2.1 (+3.9%) -2.4 (-3.6%) n/a  10/12/2016 ([facility])  -1.4 (-2%)  RFN: -2.6 LFN: -2.4  -2.1 (-2.9%)  n/a  10/12/2015 ([facility])  -1.3 (+7.1%*)  RFN: -2.6 LFN: -2.7 -1.8  n/a  10/09/2013 ([facility])  -1.8  RFN: -2.5 LFN: -2.2 -3.0 (-14.9%*)  -4.5   09/07/2011 ([facility])  n/a RFN: -2.2   -3.1 (+3.0%)   08/26/2009 ([facility])  -1.1  RFN: -1.7 LFN: -1.9     She denies any fractures. In the past, she had a fall (slipped) x1 >> on coccyx + hands >> no fxs, but developed traumatic carpal tunnel.   She denies dizziness, vertigo, orthostasis, poor vision.  Reviewed previous osteoporosis treatments: - Bisphosphonates (Fosamax, Boniva) for approximately 10 years, followed by drug holiday - Prolia x 9 inj now - last 05/27/2020, previously 11/26/2019  (She had 4 injections up to 05/2017)  We started supplementation in 2018.  Subsequent vitamin D levels were normal: Lab Results  Component Value Date   VD25OH 67.38 08/19/2019   VD25OH 41.53 12/12/2018   VD25OH 40.81 08/13/2018   VD25OH 37.58 12/06/2017   VD25OH 26.10 (L) 08/10/2017   VD25OH 50 07/12/2011   VD25OH 55 06/28/2010   VD25OH 45 05/18/2009   VD25OH 37 10/21/2008   VD25OH 36 04/04/2008   She is on  vitamin D 2000 units daily.  No weightbearing exercises-for the last 16 years: [employer] - water walking 45-60 min twice a week.  In the past, she was also using weights but not anymore due to upper back pain.  Menopause was at 74 years old.  Pt does have a FH of osteoporosis: In mother, who also had dowagers hump. Mother's aunt also had this.  No history of hyper or hypocalcemia or hyperparathyroidism.  She has a history of kidney stones and had lithotripsy x2.  No history of hyper or hypocalcemia or hyperparathyroidism. Lab Results  Component Value Date   PTH 48 08/10/2017   PTH Comment 08/10/2017   CALCIUM 9.3 05/06/2020   CALCIUM 9.7 12/18/2019   CALCIUM 9.7 08/19/2019   CALCIUM 9.4 12/12/2018   CALCIUM 9.8 08/13/2018   CALCIUM 9.3 12/06/2017   CALCIUM 9.4 08/10/2017   CALCIUM 9.2 11/29/2016   CALCIUM 9.3 11/12/2015   CALCIUM 9.0 10/02/2014   No thyrotoxicosis: Lab Results  Component Value Date   TSH 1.05 07/16/2018   TSH 1.05 11/29/2016   TSH 1.25 11/12/2015   TSH 1.11 10/02/2014   TSH 1.01 09/02/2013   TSH 1.08 09/02/2013   No CKD. Last BUN/Cr: Lab Results  Component Value Date   BUN 17 05/06/2020   CREATININE 0.83 05/06/2020   She had BrCA 2006, s/p RxTx, no ChTx >> she was on tamoxifen for 4.5 years.  She continues on PPIs for GERD.  She also has  a history of neuropathy - mostly in L leg.  She is on B12 supplements.  She has a history of elevated HbA1c: Lab Results  Component Value Date   HGBA1C 5.7 05/06/2020   HGBA1C 5.7 12/18/2019   HGBA1C 5.9 08/19/2019   HGBA1C 5.9 12/12/2018   HGBA1C 5.8 07/16/2018   ROS: Constitutional: no weight gain/no weight loss, no fatigue, no subjective hyperthermia, no subjective hypothermia Eyes: no blurry vision, no xerophthalmia ENT: no sore throat, no nodules palpated in neck, no dysphagia, no odynophagia, no hoarseness Cardiovascular: no CP/no SOB/no palpitations/no leg swelling Respiratory: no cough/no SOB/no  wheezing Gastrointestinal: no N/no V/no D/no C/+ acid reflux Musculoskeletal: no muscle aches/no joint aches Skin: no rashes, no hair loss Neurological: no tremors/no numbness/no tingling/no dizziness  I reviewed pt's medications, allergies, PMH, social hx, family hx, and changes were documented in the history of present illness. Otherwise, unchanged from my initial visit note.  Past Medical History:  Diagnosis Date  . Basal cell carcinoma of skin    left ankle, right thigh  . Bursitis    right hip  . Cancer [facility]) 2006   Breast/right  . Diverticulosis   . GERD (gastroesophageal reflux disease)   . History of breast cancer/33 radiation treatments/ no chemo    2006  S/P RIGHT LUMPECTOMY AND RADIATION FOLLOWED BY DR RUBIN--  NO RECURRENCE  . History of kidney stones   . Hyperplastic colon polyp   . IBS (irritable bowel syndrome)   . Idiopathic peripheral neuropathy    bilateral tingling/ due to fall in [place]  . Internal hemorrhoid   . Kidney stone   . Leiomyoma    Multiple small  . Liver cyst 2006   Benign  . OA (osteoarthritis)   . Osteoporosis 2019   T score -2.5  . Personal history of radiation therapy   . Raynaud disease   . Renal calculus, left    non obstructive  . Rib pain on left side    able to lay on left side  . Rosacea   . Tinnitus    Past Surgical History:  Procedure Laterality Date  . BREAST BIOPSY Left 2016   benign  . BREAST EXCISIONAL BIOPSY Right 1973   benign  . BREAST LUMPECTOMY  2006    Right -- S/P RADIATION--  NO RECURRENCE  . DILATION AND CURETTAGE OF UTERUS    . EXTRACORPOREAL SHOCK WAVE LITHOTRIPSY     X2  . HYSTEROSCOPY    . HYSTEROSCOPY WITH D & C  08/03/2012   Procedure: DILATATION AND CURETTAGE /HYSTEROSCOPY;  Surgeon: Anastasio Auerbach, MD;  Location: [facility];  Service: Gynecology;  Laterality: N/A;  . SUPRANUMERARY NIPPLE EXCISION  1973   CYST  . TONSILLECTOMY  AGE 44   Social History   Social History  .  Marital status: Married    Spouse name: N/A  . Number of children: 1   Occupational History  . Retired- Psychologist, prison and probation services Also, used to sing >> now hoarseness   Social History Main Topics  . Smoking status: Never Smoker  . Smokeless tobacco: Never Used  . Alcohol use 3.6 oz/week    6 Standard drinks or equivalent per week  . Drug use: No   Social History Narrative   Married (husband patient of Dr. Yong Channel). 1 daughter. 1 granddaughter.       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       Hobbies: reading, travel, time with granddaughter. Very active  Current Outpatient Medications on File Prior to Visit  Medication Sig Dispense Refill  . Cholecalciferol (VITAMIN D PO) Take by mouth.    . Cyanocobalamin (VITAMIN B 12 PO) Take by mouth.    . denosumab (PROLIA) 60 MG/ML SOSY injection Inject 60 mg into the skin every 6 (six) months.    . esomeprazole (NEXIUM) 40 MG capsule TAKE ONE CAPSULE BY MOUTH EVERY DAY AT NOON 90 capsule 3  . rosuvastatin (CRESTOR) 10 MG tablet Take 1 tablet (10 mg total) by mouth once a week. 13 tablet 3   No current facility-administered medications on file prior to visit.   Allergies  Allergen Reactions  . Amoxicillin Diarrhea    GI upset   Family History  Problem Relation Age of Onset  . Dementia Mother   . Depression Mother   . Osteoporosis Mother   . Stroke Father   . Heart attack Father 54  . Rectal cancer Maternal Grandmother   . Colon cancer Maternal Grandmother   . Stroke Sister        TIA. ? dementia. another stroke after covid then feeding issues led to eath  . Dementia Sister   . Breast cancer Neg Hx     PE: BP 130/82 (BP Location: Left Arm, Patient Position: Sitting, Cuff Size: Normal)   Pulse 89   Ht $R'5\' 6"'Ll$  (1.676 m)   Wt 142 lb (64.4 kg)   LMP  (LMP Unknown)   SpO2 99%   BMI 22.92 kg/m  Wt Readings from Last 3 Encounters:  08/21/20 142 lb (64.4 kg)  07/29/20 140 lb 9.6 oz (63.8 kg)  01/22/20 142 lb 12.8 oz (64.8 kg)    Constitutional: normal weight, in NAD Eyes: PERRLA, EOMI, no exophthalmos ENT: moist mucous membranes, no thyromegaly, no cervical lymphadenopathy Cardiovascular: RRR, No MRG Respiratory: CTA B Gastrointestinal: abdomen soft, NT, ND, BS+ Musculoskeletal: no deformities, strength intact in all 4 Skin: moist, warm, no rashes Neurological: no tremor with outstretched hands, DTR normal in all 4  Assessment: 1. Osteoporosis Lancet Diabetes Endocrinol. 2017 Jul;5(7):513-523. doi: 10.1016/S2213-8587(17)30138-9. Epub 2017 May 22.  10 years of denosumab treatment in postmenopausal women with osteoporosis: results from the phase 3 randomised FREEDOM trial and open-label extension. Bone HG et al.  Denosumab treatment for up to 10 years was associated with low rates of adverse events, low fracture incidence compared with that observed during the original trial, and continued increases in BMD without plateau.  - she denies fractures but she has a decrease in height from 5 ft 8 in to 5 ft 6.5 in over the years  2.  Vitamin D insufficiency  3.  Elevated HbA1c  Plan: 1. Osteoporosis -Likely age-related/postmenopausal.  She also has a family history of osteoporosis. -She continues to be fracture-free -We again discussed about fall precautions -We reviewed together her latest bone density scan obtained on 10/17/2018: The left femoral neck T-score was stable, while the right femoral neck T score in the spine T score increased significantly.  These are excellent results. -So far, she had 9 Prolia injections -She tolerates Prolia well, without jaw, hip, thigh pain -We can continue with Prolia for at least 6 to 10 years -after we finished Prolia, we need to give her at least a year of bisphosphonate, most likely Reclast since she has a history of GERD.   -At today's visit, we will check a vitamin D - reviewed CMP from 04/2020 >> normal kidney fxn; will not repeat this today -She is due  for another  bone density scan in 09/2020 -ordered today -Recommended weightbearing exercises and increase walking. -I will see her back in 1 year  2.  Vitamin D insufficiency -Vitamin D level was normal at last check -At last visit, she was actually taking 5000 units of vitamin D daily for 2 months instead of the recommended 2000 units daily -She switched back to 2000 units vitamin D daily -We will recheck vitamin D level today  3.  Elevated HbA1c -This remains in the prediabetic range: Lab Results  Component Value Date   HGBA1C 5.7 05/06/2020   HGBA1C 5.7 12/18/2019   HGBA1C 5.9 08/19/2019   HGBA1C 5.9 12/12/2018   HGBA1C 5.8 07/16/2018  -We discussed in the past about cutting down fatty foods like cheese, meat, eggs -No therapeutic intervention needed for now -We will repeat an HbA1c level today  Component     Latest Ref Rng & Units 08/21/2020  Hemoglobin A1C     4.6 - 6.5 % 6.2  Vitamin D, 25-Hydroxy     30.0 - 100.0 ng/mL 47.5  Normal TSH and vit D.  Philemon Kingdom, MD PhD [facility] Endocrinology

## 2020-08-21 NOTE — Patient Instructions (Addendum)
Please continue vitamin D 2000 units daily.  Please stop at the lab.  Please come back for a follow-up appointment in 1 year. 

## 2020-08-22 LAB — VITAMIN D 25 HYDROXY (VIT D DEFICIENCY, FRACTURES): Vit D, 25-Hydroxy: 47.5 ng/mL (ref 30.0–100.0)

## 2020-09-01 ENCOUNTER — Encounter: Payer: Self-pay | Admitting: Family Medicine

## 2020-09-21 ENCOUNTER — Encounter: Payer: Medicare Other | Admitting: Gynecology

## 2020-09-21 ENCOUNTER — Other Ambulatory Visit: Payer: Self-pay

## 2020-09-21 ENCOUNTER — Ambulatory Visit: Payer: Medicare PPO | Admitting: Obstetrics and Gynecology

## 2020-09-21 ENCOUNTER — Encounter: Payer: Self-pay | Admitting: Obstetrics and Gynecology

## 2020-09-21 VITALS — BP 120/78 | Ht 66.0 in | Wt 143.0 lb

## 2020-09-21 DIAGNOSIS — N952 Postmenopausal atrophic vaginitis: Secondary | ICD-10-CM | POA: Diagnosis not present

## 2020-09-21 DIAGNOSIS — Z01419 Encounter for gynecological examination (general) (routine) without abnormal findings: Secondary | ICD-10-CM

## 2020-09-21 DIAGNOSIS — N898 Other specified noninflammatory disorders of vagina: Secondary | ICD-10-CM

## 2020-09-21 DIAGNOSIS — M810 Age-related osteoporosis without current pathological fracture: Secondary | ICD-10-CM

## 2020-09-21 MED ORDER — NYSTATIN-TRIAMCINOLONE 100000-0.1 UNIT/GM-% EX OINT: 1.0000 "application " | TOPICAL_OINTMENT | Freq: Two times a day (BID) | CUTANEOUS | 0 refills | Status: AC

## 2020-09-21 NOTE — Progress Notes (Signed)
   Dana Chambers 1945-12-17 [id number]  SUBJECTIVE:  74 y.o. G48P1001 female here for a breast and pelvic exam. Notes some external vaginal irritation/rawness after using health club soaps on bottom.  No discharge or bleeding.  Has mild soreness in right breast from time to time but mostly positional. She has no other gynecologic concerns.  Current Outpatient Medications  Medication Sig Dispense Refill  . Cholecalciferol (VITAMIN D PO) Take by mouth.    . Cyanocobalamin (VITAMIN B 12 PO) Take by mouth.    . denosumab (PROLIA) 60 MG/ML SOSY injection Inject 60 mg into the skin every 6 (six) months.    . esomeprazole (NEXIUM) 40 MG capsule TAKE ONE CAPSULE BY MOUTH EVERY DAY AT NOON 90 capsule 3   No current facility-administered medications for this visit.   Allergies: Amoxicillin  No LMP recorded (lmp unknown). Patient is postmenopausal.  Past medical history,surgical history, problem list, medications, allergies, family history and social history were all reviewed and documented as reviewed in the EPIC chart.  GYN ROS: no abnormal bleeding, pelvic pain or discharge, no breast pain or new or enlarging lumps on self exam.  No dysuria, urinary frequency, pain with urination, cloudy/malodorous urine.   OBJECTIVE:  BP 120/78   Ht 5\' 6"  (1.676 m)   Wt 143 lb (64.9 kg)   LMP  (LMP Unknown)   BMI 23.08 kg/m  The patient appears well, alert, oriented, in no distress.  BREAST EXAM: breasts appear normal, right breast status post lumpectomy with nipple resection, no suspicious masses, no skin or nipple changes or axillary nodes  PELVIC EXAM: VULVA: normal appearing vulva with atrophic change, no masses, tenderness or lesions, VAGINA: normal appearing vagina with atrophic change, normal color and discharge, no lesions, CERVIX: normal appearing atrophic cervix without discharge or lesions, UTERUS: uterus is normal size, shape, consistency and nontender, ADNEXA: normal adnexa in size, nontender  and no masses, WET MOUNT done - results: negative for pathogens, normal epithelial cells   Chaperone: [name] present during the examination  ASSESSMENT:  74 y.o. G1P1001 here for a breast and pelvic exam  PLAN:   1. Postmenopausal.  No significant hot flashes or night sweats.  No vaginal bleeding.  Vaginal irritation likely due to use of soaps and the drying effect from that. Vaginal wet mount negative.  Recommend using Mycolog ointment twice daily for 7 days, rx sent to pharmacy, follow-up if no improvement in symptoms. 2. Pap smear 2019.  No significant history of abnormal Pap smears.  Will revisit at next 3-year interval in 2022 if she desires to continue with screening based on the current guidelines. 3.  History of right breast cancer.  Mammogram scheduled for tomorrow.  Normal breast exam today/NED.  The intermittent soreness is positional and there are no abnormalities detected on examination today and she does have a mammogram tomorrow.  Will follow-up if there is any worsening of those symptoms or she notes any changes in the breast. 4. Colonoscopy 2018.  She will follow up at the interval recommended by her GI specialist.   5. Osteoporosis.  DEXA 2019 with T score -2.5 on Prolia.  Following with Dr. Cruzita Lederer. 6. Health maintenance.  No labs today as she normally has these completed with her primary care doctor.  Return annually or sooner, prn.  Joseph Pierini MD 09/21/20

## 2020-09-22 ENCOUNTER — Ambulatory Visit
Admission: RE | Admit: 2020-09-22 | Discharge: 2020-09-22 | Disposition: A | Discharge status: Home / Self Care | Payer: Medicare PPO | Source: Ambulatory Visit | Attending: Obstetrics and Gynecology | Admitting: Obstetrics and Gynecology

## 2020-09-22 DIAGNOSIS — Z1231 Encounter for screening mammogram for malignant neoplasm of breast: Secondary | ICD-10-CM

## 2020-09-22 LAB — WET PREP FOR TRICH, YEAST, CLUE

## 2020-09-24 ENCOUNTER — Other Ambulatory Visit: Payer: Self-pay | Admitting: Obstetrics and Gynecology

## 2020-09-24 DIAGNOSIS — R928 Other abnormal and inconclusive findings on diagnostic imaging of breast: Secondary | ICD-10-CM

## 2020-10-12 ENCOUNTER — Other Ambulatory Visit: Payer: Self-pay

## 2020-10-12 ENCOUNTER — Other Ambulatory Visit: Payer: Self-pay | Admitting: Obstetrics and Gynecology

## 2020-10-12 ENCOUNTER — Ambulatory Visit
Admission: RE | Admit: 2020-10-12 | Discharge: 2020-10-12 | Disposition: A | Discharge status: Home / Self Care | Payer: Medicare PPO | Source: Ambulatory Visit | Attending: Obstetrics and Gynecology | Admitting: Obstetrics and Gynecology

## 2020-10-12 DIAGNOSIS — R921 Mammographic calcification found on diagnostic imaging of breast: Secondary | ICD-10-CM | POA: Diagnosis not present

## 2020-10-12 DIAGNOSIS — R928 Other abnormal and inconclusive findings on diagnostic imaging of breast: Secondary | ICD-10-CM

## 2020-10-19 DIAGNOSIS — M8589 Other specified disorders of bone density and structure, multiple sites: Secondary | ICD-10-CM | POA: Diagnosis not present

## 2020-10-19 DIAGNOSIS — M810 Age-related osteoporosis without current pathological fracture: Secondary | ICD-10-CM | POA: Diagnosis not present

## 2020-10-21 ENCOUNTER — Ambulatory Visit
Admission: RE | Admit: 2020-10-21 | Discharge: 2020-10-21 | Disposition: A | Discharge status: Home / Self Care | Payer: Medicare PPO | Source: Ambulatory Visit | Attending: Obstetrics and Gynecology | Admitting: Obstetrics and Gynecology

## 2020-10-21 ENCOUNTER — Encounter: Payer: Self-pay | Admitting: Gynecology

## 2020-10-21 ENCOUNTER — Other Ambulatory Visit: Payer: Self-pay

## 2020-10-21 DIAGNOSIS — R921 Mammographic calcification found on diagnostic imaging of breast: Secondary | ICD-10-CM | POA: Diagnosis not present

## 2020-10-21 DIAGNOSIS — N6031 Fibrosclerosis of right breast: Secondary | ICD-10-CM | POA: Diagnosis not present

## 2020-10-26 ENCOUNTER — Telehealth: Payer: Self-pay

## 2020-10-26 ENCOUNTER — Encounter: Payer: Self-pay | Admitting: Internal Medicine

## 2020-10-26 NOTE — Telephone Encounter (Signed)
Avoid all soaps on the bottom and use petroleum jelly/Aquaphor 2-3 times daily

## 2020-10-26 NOTE — Telephone Encounter (Signed)
At 09/21/20 office visit you wrote "Notes some external vaginal irritation/rawness after using health club soaps on bottom."  You prescribed Nystatin cream for her. She said she used it for a week with little relief and she felt like it irritated it worse.  Sx persist. She asked if anything else you can recommend?

## 2020-10-26 NOTE — Telephone Encounter (Signed)
Spoke with patient and informed her. °

## 2020-11-06 ENCOUNTER — Encounter: Payer: Self-pay | Admitting: Gynecology

## 2020-11-17 ENCOUNTER — Telehealth: Payer: Self-pay | Admitting: Internal Medicine

## 2020-11-17 NOTE — Telephone Encounter (Signed)
Patient requests to be called at ph# [phone] re: Prolia injection (Patient states she is due for next Prolia injection). Patient states she has not heard from our office to date and to make sure the Prolia injection has insurance approval.

## 2020-11-17 NOTE — Telephone Encounter (Signed)
She will not get this verified until after Jan 1st that is when she is due and that is when the process with insurance starts over she is on the list to be verified in January

## 2020-11-17 NOTE — Telephone Encounter (Signed)
Could you help patient? 

## 2020-11-18 NOTE — Telephone Encounter (Addendum)
Spoken to patient and notified comments below. Verbalized understanding.

## 2020-12-17 NOTE — Progress Notes (Signed)
Phone [phone]   Subjective:  Patient presents today for their annual physical. Chief complaint-noted.   See problem oriented charting- ROS- full  review of systems was completed and negative except for: swearing, hearing loss unchanged, tinnitus, join tpain- left shoulder/upper back pain, sleep issue related to shoulder The following were reviewed and entered/updated in epic: Past Medical History:  Diagnosis Date  . Basal cell carcinoma of skin    left ankle, right thigh  . Bursitis    right hip  . Cancer [facility]) 2006   Breast/right  . Diverticulosis   . GERD (gastroesophageal reflux disease)   . History of breast cancer/33 radiation treatments/ no chemo    2006  S/P RIGHT LUMPECTOMY AND RADIATION FOLLOWED BY DR RUBIN--  NO RECURRENCE  . History of kidney stones   . Hyperplastic colon polyp   . IBS (irritable bowel syndrome)   . Idiopathic peripheral neuropathy    bilateral tingling/ due to fall in [place]  . Internal hemorrhoid   . Kidney stone   . Leiomyoma    Multiple small  . Liver cyst 2006   Benign  . OA (osteoarthritis)   . Osteoporosis 2019   T score -2.5  . Personal history of radiation therapy   . Raynaud disease   . Renal calculus, left    non obstructive  . Rib pain on left side    able to lay on left side  . Rosacea   . Tinnitus    Patient Active Problem List   Diagnosis Date Noted  . Hyperlipidemia- february 2020 coronary Calcium score of 0 12/12/2018    Priority: Medium  . Rib pain on left side 03/21/2017    Priority: Medium  . Scoliosis 03/21/2017    Priority: Medium  . Osteoporosis 06/29/2009    Priority: Medium  . 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    Priority: Medium  . GERD 05/01/1999    Priority: Medium  . Hearing loss associated with syndrome of both ears 12/05/2016    Priority: Low  . Neuropathy 03/06/2012    Priority: Low  . COLONIC POLYPS 05/25/2006    Priority: Low  . Vitamin D insufficiency 08/13/2018  .  Elevated glycosylated hemoglobin 08/13/2018   Past Surgical History:  Procedure Laterality Date  . BREAST BIOPSY Left 2016   benign  . BREAST EXCISIONAL BIOPSY Right 1973   benign  . BREAST LUMPECTOMY  2006    Right -- S/P RADIATION--  NO RECURRENCE  . DILATION AND CURETTAGE OF UTERUS    . EXTRACORPOREAL SHOCK WAVE LITHOTRIPSY     X2  . HYSTEROSCOPY    . HYSTEROSCOPY WITH D & C  08/03/2012   Procedure: DILATATION AND CURETTAGE /HYSTEROSCOPY;  Surgeon: Anastasio Auerbach, MD;  Location: [place];  Service: Gynecology;  Laterality: N/A;  . SUPRANUMERARY NIPPLE EXCISION  1973   CYST  . TONSILLECTOMY  AGE 1    Family History  Problem Relation Age of Onset  . Dementia Mother   . Depression Mother   . Osteoporosis Mother   . Stroke Father   . Heart attack Father 75  . Rectal cancer Maternal Grandmother   . Colon cancer Maternal Grandmother   . Stroke Sister        TIA. ? dementia. another stroke after covid then feeding issues led to eath  . Dementia Sister   . Breast cancer Neg Hx     Medications- reviewed and updated Current Outpatient Medications  Medication  Sig Dispense Refill  . Cholecalciferol (VITAMIN D PO) Take by mouth.    . Cyanocobalamin (VITAMIN B 12 PO) Take by mouth.    . denosumab (PROLIA) 60 MG/ML SOSY injection Inject 60 mg into the skin every 6 (six) months.    . esomeprazole (NEXIUM) 20 MG capsule TAKE ONE CAPSULE BY MOUTH EVERY DAY AT NOON 90 capsule 3   No current facility-administered medications for this visit.    Allergies-reviewed and updated Allergies  Allergen Reactions  . Amoxicillin Diarrhea    GI upset    Social History   Social History Narrative   Married (husband patient of Dr. Yong Channel). 1 daughter. 1 granddaughter.       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       Hobbies: reading, travel, time with granddaughter. Very active   Objective  Objective:  BP 136/86   Pulse 80   Temp 98.2 F (36.8 C) (Temporal)    Ht 5\' 6"  (1.676 m)   Wt 142 lb (64.4 kg)   LMP  (LMP Unknown)   BMI 22.92 kg/m  Gen: NAD, resting comfortably HEENT: Mucous membranes are moist. Oropharynx normal Neck: no thyromegaly CV: RRR no murmurs rubs or gallops Lungs: CTAB no crackles, wheeze, rhonchi Abdomen: soft/nontender/nondistended/normal bowel sounds. No rebound or guarding.  Ext: no edema Skin: warm, dry Neuro: grossly normal, moves all extremities, PERRLA   Assessment and Plan   75 y.o. female presenting for annual physical.  Health Maintenance counseling: 1. Anticipatory guidance: Patient counseled regarding regular dental exams -q6 months, eye exams - q6 months,  avoiding smoking and second hand smoke , limiting alcohol to 1 beverage per day-still 1-2 a day- discussed cutting back to 1 once again- harder with covid .   2. Risk factor reduction:  Advised patient of need for regular exercise and diet rich and fruits and vegetables to reduce risk of heart attack and stroke. Exercise- pool has been having issues due to pump- has been off due to this over last month- walking has been hard outdoors. Diet- reasonably healthy diet- husband loves to cook which can make it harder- butter and bacon intake could be lower Wt Readings from Last 3 Encounters:  12/21/20 142 lb (64.4 kg)  09/21/20 143 lb (64.9 kg)  08/21/20 142 lb (64.4 kg)  3. Immunizations/screenings/ancillary studies- fully up to date  Immunization History  Administered Date(s) Administered  . Fluad Quad(high Dose 65+) 07/29/2020  . Influenza Whole 10/29/2009, 08/11/2010, 08/19/2011  . Influenza, High Dose Seasonal PF 09/29/2016, 08/21/2017, 08/01/2018, 07/15/2019, 07/29/2020  . Influenza,inj,Quad PF,6+ Mos 08/21/2013, 09/02/2014  . Influenza-Unspecified 09/09/2015, 08/01/2018, 07/17/2019  . PFIZER(Purple Top)SARS-COV-2 Vaccination 12/23/2019, 01/13/2020, 07/31/2020  . Pneumococcal Conjugate-13 09/02/2014  . Pneumococcal Polysaccharide-23 03/06/2012  . Tdap  03/06/2012  . Zoster Recombinat (Shingrix) 03/10/2018, 05/10/2018  4. Cervical cancer screening- past formal age based screening recommendations.  No vaginal bleeding or discharge noted. Still seeing Dr. Delilah Shan- he has moved and will be likely working with Dr. Talbert Nan or Dr. Quincy Simmonds 5. Breast cancer screening-  breast exam-self exams monthly and sees GYN and mammogram -completed on 10/12/20- ended up with biopsy  And 6 month follow up.  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 Colon cancer screening - due in 2023-last completed March 2018 7. Skin cancer screening-followed by dermatology. advised regular sunscreen use. Denies worrisome, changing, or new skin lesions- other than breast- looks like seborrheic keratoses- can see derm if worsens- may try apple cider  vinegar 8. Birth control/STD check- postmenopausal and monogamous 9. Osteoporosis screening at 100- see discussion below -Never smoker  Status of chronic or acute concerns   #Osteoporosis-patient follows with Dr. Cruzita Lederer.  She is on Prolia with plans for 6 to 10 years of total treatment and then Reclast for 1 year instead of Fosamax due to history of GERD.  Compliant with calcium through diet (due to kidney stones in past)and vitamin D. Had prolia today  #hyperlipidemia-patient with history of coronary calcium score of 0  S: Medication:With family history patient is opted to take rosuvastatin 10 mg once a weekWith incredible improvement of LDL from over 200-113. She came off statin on oct 16 with increased a1c Lab Results  Component Value Date   CHOL 200 05/06/2020   HDL 72.40 05/06/2020   LDLCALC 113 (H) 05/06/2020   LDLDIRECT 168.5 02/02/2012   TRIG 72.0 05/06/2020   CHOLHDL 3 05/06/2020   A/P: update lipids today- hoping has not climbed as significantly back off statin. Thankfully has reassuring coronary calcium scoring of 0  # GERD S:Medication: Nexium 40Mg  B12 levels related to PPI use: Lab  Results  Component Value Date   VITAMINB12 312 07/16/2018  A/P: due to osteoporosis- we are going to try 20 mg and see if this is helpful.    # Hyperglycemia/insulin resistance/prediabetes S:  Medication: none Exercise and diet- see above Lab Results  Component Value Date   HGBA1C 6.2 08/21/2020   HGBA1C 5.7 05/06/2020   HGBA1C 5.7 12/18/2019   A/P: a1c jumped up in September- possibly related to rosuvastatin- we are going to see how #s look back off of medicine  #left upper back pain- started after traveling nad not taking her own pillow but has been persistent- we had discussed massage or heat- has not had a chance on massage. Also gets some mild numbness into left axilla and pain into left upper arm. Worse with certain positions such as sleep - even woke her up today. Does not worsen with exercise or exertion such as going up flight of stairs -referral to sports medicine today- has seen Dr. Paulla Fore in past and requests to see him again if insurance covers  #Rash under left breast- noted on Saturday- thinks may have been there and didn't notice - looks like seborrheic keratoses  Recommended follow up: Return in about 1 year (around 12/21/2021) for physical or sooner if needed. Future Appointments  Date Time Provider [place]  08/23/2021  9:00 AM Philemon Kingdom, MD [facility] None  09/22/2021  9:00 AM Joseph Pierini, MD [facility] None   Lab/Order associations: fasting   ICD-10-CM   1. Preventative health care  Z00.00 CBC with Differential/Platelet    Comprehensive metabolic panel    Lipid panel  2. Gastroesophageal reflux disease, unspecified whether esophagitis present  K21.9   3. Hyperlipidemia, unspecified hyperlipidemia type  E78.5 CBC with Differential/Platelet    Comprehensive metabolic panel    Lipid panel  4. Vitamin D insufficiency  E55.9   5. Hyperglycemia  R73.9 Hemoglobin A1c  6. Upper back pain on left side  M54.9 Ambulatory referral to Sports Medicine     Meds ordered this encounter  Medications  . esomeprazole (NEXIUM) 20 MG capsule    Sig: TAKE ONE CAPSULE BY MOUTH EVERY DAY AT NOON    Dispense:  90 capsule    Refill:  3    Return precautions advised.  Garret Reddish, MD

## 2020-12-21 ENCOUNTER — Ambulatory Visit (INDEPENDENT_AMBULATORY_CARE_PROVIDER_SITE_OTHER): Payer: Medicare PPO | Admitting: Family Medicine

## 2020-12-21 ENCOUNTER — Other Ambulatory Visit: Payer: Self-pay

## 2020-12-21 ENCOUNTER — Encounter: Payer: Self-pay | Admitting: Family Medicine

## 2020-12-21 VITALS — BP 136/86 | HR 80 | Temp 98.2°F | Ht 66.0 in | Wt 142.0 lb

## 2020-12-21 DIAGNOSIS — E785 Hyperlipidemia, unspecified: Secondary | ICD-10-CM | POA: Diagnosis not present

## 2020-12-21 DIAGNOSIS — R739 Hyperglycemia, unspecified: Secondary | ICD-10-CM

## 2020-12-21 DIAGNOSIS — K219 Gastro-esophageal reflux disease without esophagitis: Secondary | ICD-10-CM

## 2020-12-21 DIAGNOSIS — Z0000 Encounter for general adult medical examination without abnormal findings: Secondary | ICD-10-CM | POA: Diagnosis not present

## 2020-12-21 DIAGNOSIS — M549 Dorsalgia, unspecified: Secondary | ICD-10-CM

## 2020-12-21 DIAGNOSIS — E559 Vitamin D deficiency, unspecified: Secondary | ICD-10-CM

## 2020-12-21 LAB — CBC WITH DIFFERENTIAL/PLATELET
Basophils Absolute: 0 10*3/uL (ref 0.0–0.1)
Basophils Relative: 0.3 % (ref 0.0–3.0)
Eosinophils Absolute: 0.1 10*3/uL (ref 0.0–0.7)
Eosinophils Relative: 1.1 % (ref 0.0–5.0)
HCT: 42.8 % (ref 36.0–46.0)
Hemoglobin: 14.5 g/dL (ref 12.0–15.0)
Lymphocytes Relative: 39.1 % (ref 12.0–46.0)
Lymphs Abs: 2.8 10*3/uL (ref 0.7–4.0)
MCHC: 33.8 g/dL (ref 30.0–36.0)
MCV: 90.5 fl (ref 78.0–100.0)
Monocytes Absolute: 0.5 10*3/uL (ref 0.1–1.0)
Monocytes Relative: 7.1 % (ref 3.0–12.0)
Neutro Abs: 3.8 10*3/uL (ref 1.4–7.7)
Neutrophils Relative %: 52.4 % (ref 43.0–77.0)
Platelets: 306 10*3/uL (ref 150.0–400.0)
RBC: 4.73 Mil/uL (ref 3.87–5.11)
RDW: 13.5 % (ref 11.5–15.5)
WBC: 7.2 10*3/uL (ref 4.0–10.5)

## 2020-12-21 LAB — COMPREHENSIVE METABOLIC PANEL
ALT: 12 U/L (ref 0–35)
AST: 18 U/L (ref 0–37)
Albumin: 4.9 g/dL (ref 3.5–5.2)
Alkaline Phosphatase: 57 U/L (ref 39–117)
BUN: 18 mg/dL (ref 6–23)
CO2: 29 mEq/L (ref 19–32)
Calcium: 9.6 mg/dL (ref 8.4–10.5)
Chloride: 102 mEq/L (ref 96–112)
Creatinine, Ser: 0.83 mg/dL (ref 0.40–1.20)
GFR: 68.56 mL/min (ref >60.00)
Glucose, Bld: 96 mg/dL (ref 70–99)
Potassium: 4.4 mEq/L (ref 3.5–5.1)
Sodium: 140 mEq/L (ref 135–145)
Total Bilirubin: 0.5 mg/dL (ref 0.2–1.2)
Total Protein: 7.7 g/dL (ref 6.0–8.3)

## 2020-12-21 LAB — LIPID PANEL
Cholesterol: 306 mg/dL — ABNORMAL HIGH (ref 0–200)
HDL: 82.3 mg/dL (ref >39.00)
LDL Cholesterol: 200 mg/dL — ABNORMAL HIGH (ref 0–99)
NonHDL: 224.1
Total CHOL/HDL Ratio: 4
Triglycerides: 119 mg/dL (ref 0.0–149.0)
VLDL: 23.8 mg/dL (ref 0.0–40.0)

## 2020-12-21 LAB — HEMOGLOBIN A1C: Hgb A1c MFr Bld: 6 % (ref 4.6–6.5)

## 2020-12-21 MED ORDER — ESOMEPRAZOLE MAGNESIUM 20 MG PO CPDR
DELAYED_RELEASE_CAPSULE | ORAL | 3 refills | Status: DC
Start: 2020-12-21 — End: 2021-12-10

## 2020-12-21 NOTE — Telephone Encounter (Signed)
Spoke to the patient today at [facility] and reverified her in the portal for this year-will need a new PA so once verified I will do this and call the pt to schedule a nurse visit

## 2020-12-21 NOTE — Patient Instructions (Addendum)
You are eligible to schedule your annual wellness visit with our nurse specialist Otila Kluver.  Please consider scheduling this before you leave today  We will call you within two weeks about your referral to Dr. Paulla Fore- [facility]. If you do not hear within 2 weeks, give [place] a call.   Please stop by lab before you go If you have mychart- we will send your results within 3 business days of [place] receiving them.  If you do not have mychart- we will call you about results within 5 business days of [place] receiving them.  *please also note that you will see labs on mychart as soon as they post. I will later go in and write notes on them- will say "notes from Dr. Yong Channel"  Recommended follow up: Return in about 1 year (around 12/21/2021) for physical or sooner if needed.

## 2020-12-23 ENCOUNTER — Telehealth: Payer: Self-pay

## 2020-12-23 NOTE — Telephone Encounter (Addendum)
Left pt a VM letting her know she is ok to get scheduled for a nurse visit to get Prolia-she wants to come to [facility] to have this done since is close to her home and she will owe $0 at check-in  PA approval # [id number] Good from 05/15/20 to 11/27/21

## 2020-12-25 DIAGNOSIS — H11441 Conjunctival cysts, right eye: Secondary | ICD-10-CM | POA: Diagnosis not present

## 2020-12-29 ENCOUNTER — Ambulatory Visit
Admission: RE | Admit: 2020-12-29 | Discharge: 2020-12-29 | Disposition: A | Discharge status: Home / Self Care | Payer: Medicare PPO | Source: Ambulatory Visit | Attending: Sports Medicine | Admitting: Sports Medicine

## 2020-12-29 ENCOUNTER — Other Ambulatory Visit: Payer: Self-pay

## 2020-12-29 ENCOUNTER — Other Ambulatory Visit: Payer: Self-pay | Admitting: Sports Medicine

## 2020-12-29 DIAGNOSIS — M9908 Segmental and somatic dysfunction of rib cage: Secondary | ICD-10-CM | POA: Diagnosis not present

## 2020-12-29 DIAGNOSIS — M47814 Spondylosis without myelopathy or radiculopathy, thoracic region: Secondary | ICD-10-CM | POA: Diagnosis not present

## 2020-12-29 DIAGNOSIS — M4003 Postural kyphosis, cervicothoracic region: Secondary | ICD-10-CM | POA: Diagnosis not present

## 2020-12-29 DIAGNOSIS — M542 Cervicalgia: Secondary | ICD-10-CM

## 2020-12-29 DIAGNOSIS — M549 Dorsalgia, unspecified: Secondary | ICD-10-CM

## 2020-12-29 DIAGNOSIS — M419 Scoliosis, unspecified: Secondary | ICD-10-CM | POA: Diagnosis not present

## 2020-12-29 DIAGNOSIS — M9902 Segmental and somatic dysfunction of thoracic region: Secondary | ICD-10-CM | POA: Diagnosis not present

## 2020-12-29 DIAGNOSIS — M25512 Pain in left shoulder: Secondary | ICD-10-CM | POA: Diagnosis not present

## 2020-12-29 DIAGNOSIS — M4312 Spondylolisthesis, cervical region: Secondary | ICD-10-CM | POA: Diagnosis not present

## 2020-12-29 DIAGNOSIS — M8588 Other specified disorders of bone density and structure, other site: Secondary | ICD-10-CM | POA: Diagnosis not present

## 2020-12-29 DIAGNOSIS — M47812 Spondylosis without myelopathy or radiculopathy, cervical region: Secondary | ICD-10-CM | POA: Diagnosis not present

## 2020-12-29 DIAGNOSIS — M810 Age-related osteoporosis without current pathological fracture: Secondary | ICD-10-CM | POA: Diagnosis not present

## 2020-12-29 DIAGNOSIS — M9907 Segmental and somatic dysfunction of upper extremity: Secondary | ICD-10-CM | POA: Diagnosis not present

## 2020-12-30 ENCOUNTER — Ambulatory Visit (INDEPENDENT_AMBULATORY_CARE_PROVIDER_SITE_OTHER): Payer: Medicare PPO | Admitting: *Deleted

## 2020-12-30 ENCOUNTER — Encounter: Payer: Self-pay | Admitting: Family Medicine

## 2020-12-30 DIAGNOSIS — M818 Other osteoporosis without current pathological fracture: Secondary | ICD-10-CM

## 2020-12-30 MED ORDER — DENOSUMAB 60 MG/ML ~~LOC~~ SOSY
60.0000 mg | PREFILLED_SYRINGE | Freq: Once | SUBCUTANEOUS | Status: AC
  Administered 2020-12-30: 60 mg via SUBCUTANEOUS

## 2020-12-30 NOTE — Progress Notes (Signed)
Patient present for Prolia inj Given on right arm. Patient tolerated well  Prolia Injection documented on [facility]

## 2021-01-12 DIAGNOSIS — M9908 Segmental and somatic dysfunction of rib cage: Secondary | ICD-10-CM | POA: Diagnosis not present

## 2021-01-12 DIAGNOSIS — M5451 Vertebrogenic low back pain: Secondary | ICD-10-CM | POA: Diagnosis not present

## 2021-01-12 DIAGNOSIS — M9902 Segmental and somatic dysfunction of thoracic region: Secondary | ICD-10-CM | POA: Diagnosis not present

## 2021-01-12 DIAGNOSIS — M4003 Postural kyphosis, cervicothoracic region: Secondary | ICD-10-CM | POA: Diagnosis not present

## 2021-01-12 DIAGNOSIS — M810 Age-related osteoporosis without current pathological fracture: Secondary | ICD-10-CM | POA: Diagnosis not present

## 2021-01-12 DIAGNOSIS — M25512 Pain in left shoulder: Secondary | ICD-10-CM | POA: Diagnosis not present

## 2021-01-12 DIAGNOSIS — M9907 Segmental and somatic dysfunction of upper extremity: Secondary | ICD-10-CM | POA: Diagnosis not present

## 2021-01-12 DIAGNOSIS — M542 Cervicalgia: Secondary | ICD-10-CM | POA: Diagnosis not present

## 2021-01-12 DIAGNOSIS — M546 Pain in thoracic spine: Secondary | ICD-10-CM | POA: Diagnosis not present

## 2021-01-18 ENCOUNTER — Other Ambulatory Visit: Payer: Self-pay | Admitting: Family Medicine

## 2021-01-26 ENCOUNTER — Telehealth: Payer: Medicare PPO | Admitting: Family Medicine

## 2021-01-26 ENCOUNTER — Encounter: Payer: Self-pay | Admitting: Family Medicine

## 2021-01-26 DIAGNOSIS — U071 COVID-19: Secondary | ICD-10-CM | POA: Diagnosis not present

## 2021-01-26 MED ORDER — BENZONATATE 100 MG PO CAPS: 100.0000 mg | ORAL_CAPSULE | Freq: Three times a day (TID) | ORAL | 0 refills | Status: DC | PRN

## 2021-01-26 NOTE — Patient Instructions (Signed)
  HOME CARE TIPS:   -I sent the medication(s) we discussed to your pharmacy: Meds ordered this encounter  Medications  . benzonatate (TESSALON PERLES) 100 MG capsule    Sig: Take 1 capsule (100 mg total) by mouth 3 (three) times daily as needed.    Dispense:  20 capsule    Refill:  0     -I sent a message to the outpatient Covid treatment center letting them know that you are interested in treatment. Currently there are limited treatment doses available and the are calling the folks that are at the highest risk of hospitalization or severe disease first.   -can use tylenol  if needed for fevers, aches and pains per instructions  -can use nasal saline a few times per day if you have nasal congestion  -stay hydrated, drink plenty of fluids and eat small healthy meals - avoid dairy  -can take 1000 IU (2mcg) Vit D3 and 100-500 mg of Vit C daily per instructions  -follow up with your doctor in 2-3 days unless improving and feeling better  -stay home while sick, except to seek medical care, and if you have [place] ideally it would be best to stay home for a full 10 days since the onset of symptoms PLUS one day of no fever and feeling better. Wear a good mask (such as N95 or KN95) if around others to reduce the risk of transmission.  It was nice to meet you today, and I really hope you are feeling better soon. I help [place] out with telemedicine visits on Tuesdays and Thursdays and am available for visits on those days. If you have any concerns or questions following this visit please schedule a follow up visit with your Primary Care doctor or seek care at a local urgent care clinic to avoid delays in care.    Seek in person care or schedule a follow up video visit promptly if your symptoms worsen, new concerns arise or you are not improving with treatment. Call 911 and/or seek emergency care if your symptoms are severe or life threatening.

## 2021-01-26 NOTE — Progress Notes (Signed)
Virtual Visit via Video Note  I connected with Dana Chambers  on 01/26/21 at 11:00 AM EST by a video enabled telemedicine application and verified that I am speaking with the correct person using two identifiers.  Location patient: home, [place] Location provider:work or home office Persons participating in the virtual visit: patient, provider  I discussed the limitations of evaluation and management by telemedicine and the availability of in person appointments. The patient expressed understanding and agreed to proceed.   HPI:  Acute telemedicine visit for : -Onset: yesterday; covid test was positive today -Symptoms include: body aches, sore throat, mild temp 99.2, BP 123/84, P 94 -Denies:CP, SOB, NVD, inability to eat/drink/get out of bed -Has tried: -Pertinent past medical history: hld, hx paget's disease -Pertinent medication allergies: -COVID-19 vaccine status: fully vaccinated and had booster for covid  ROS: See pertinent positives and negatives per HPI.  Past Medical History:  Diagnosis Date  . Basal cell carcinoma of skin    left ankle, right thigh  . Bursitis    right hip  . Cancer [facility]) 2006   Breast/right  . Diverticulosis   . GERD (gastroesophageal reflux disease)   . History of breast cancer/33 radiation treatments/ no chemo    2006  S/P RIGHT LUMPECTOMY AND RADIATION FOLLOWED BY DR RUBIN--  NO RECURRENCE  . History of kidney stones   . Hyperplastic colon polyp   . IBS (irritable bowel syndrome)   . Idiopathic peripheral neuropathy    bilateral tingling/ due to fall in [place]  . Internal hemorrhoid   . Kidney stone   . Leiomyoma    Multiple small  . Liver cyst 2006   Benign  . OA (osteoarthritis)   . Osteoporosis 2019   T score -2.5  . Personal history of radiation therapy   . Raynaud disease   . Renal calculus, left    non obstructive  . Rib pain on left side    able to lay on left side  . Rosacea   . Tinnitus     Past Surgical History:  Procedure  Laterality Date  . BREAST BIOPSY Left 2016   benign  . BREAST EXCISIONAL BIOPSY Right 1973   benign  . BREAST LUMPECTOMY  2006    Right -- S/P RADIATION--  NO RECURRENCE  . DILATION AND CURETTAGE OF UTERUS    . EXTRACORPOREAL SHOCK WAVE LITHOTRIPSY     X2  . HYSTEROSCOPY    . HYSTEROSCOPY WITH D & C  08/03/2012   Procedure: DILATATION AND CURETTAGE /HYSTEROSCOPY;  Surgeon: Anastasio Auerbach, MD;  Location: [place];  Service: Gynecology;  Laterality: N/A;  . SUPRANUMERARY NIPPLE EXCISION  1973   CYST  . TONSILLECTOMY  AGE 54     Current Outpatient Medications:  .  Cholecalciferol (VITAMIN D PO), Take by mouth., Disp: , Rfl:  .  Cyanocobalamin (VITAMIN B 12 PO), Take by mouth., Disp: , Rfl:  .  denosumab (PROLIA) 60 MG/ML SOSY injection, Inject 60 mg into the skin every 6 (six) months., Disp: , Rfl:  .  esomeprazole (NEXIUM) 20 MG capsule, TAKE ONE CAPSULE BY MOUTH EVERY DAY AT NOON, Disp: 90 capsule, Rfl: 3 .  esomeprazole (NEXIUM) 40 MG capsule, TAKE 1 CAPSULE BY MOUTH DAILY AT NOON, Disp: 90 capsule, Rfl: 3  EXAM:  VITALS per patient if applicable:  GENERAL: alert, oriented, appears well and in no acute distress  HEENT: atraumatic, conjunttiva clear, no obvious abnormalities on inspection of external nose and ears  NECK: normal movements of the head and neck  LUNGS: on inspection no signs of respiratory distress, breathing rate appears normal, no obvious gross SOB, gasping or wheezing  CV: no obvious cyanosis  MS: moves all visible extremities without noticeable abnormality  PSYCH/NEURO: pleasant and cooperative, no obvious depression or anxiety, speech and thought processing grossly intact  ASSESSMENT AND PLAN:  Discussed the following assessment and plan:  No diagnosis found.  -we discussed possible serious and likely etiologies, options for evaluation and workup, limitations of telemedicine visit vs in person visit, treatment, treatment risks  and precautions. Pt prefers to treat via telemedicine empirically rather than in person at this moment.  She is requesting a referral to the [facility] health treatment center.   Discussed treatment options, ideal treatment window, potential complications, isolation and precautions for COVID-19. Her husband had Covid last week and had a great experience with the referral to the treatment center.  She would like to consider the available treatment.  Referral placed.   She did want a prescription for cough, Tessalon Rx sent.  Other symptomatic care measures summarized in patient instructions. Work/School slipped offered:  declined Scheduled follow up with PCP offered: Agrees to schedule follow-up with her primary care office if needed. Advised to seek prompt in person care if worsening, new symptoms arise, or if is not improving with treatment. Discussed options for inperson care if PCP office not available. Did let this patient know that I only do telemedicine on Tuesdays and Thursdays for [place]. Advised to schedule follow up visit with PCP or [facility] if any further questions or concerns to avoid delays in care.   I discussed the assessment and treatment plan with the patient. The patient was provided an opportunity to ask questions and all were answered. The patient agreed with the plan and demonstrated an understanding of the instructions.     Dana Kern, DO

## 2021-01-27 ENCOUNTER — Telehealth: Payer: Self-pay

## 2021-01-27 ENCOUNTER — Encounter: Payer: Self-pay | Admitting: Adult Health

## 2021-01-27 ENCOUNTER — Other Ambulatory Visit: Payer: Self-pay | Admitting: Adult Health

## 2021-01-27 DIAGNOSIS — U071 COVID-19: Secondary | ICD-10-CM

## 2021-01-27 HISTORY — DX: COVID-19: U07.1

## 2021-01-27 MED ORDER — NIRMATRELVIR/RITONAVIR (PAXLOVID)TABLET: 3.0000 | ORAL_TABLET | Freq: Two times a day (BID) | ORAL | 0 refills | Status: AC

## 2021-01-27 MED FILL — PAXLOVID 20 X 150 MG & 10 X: 20 X 150 MG | 5 days supply | Qty: 30 | Fill #0

## 2021-01-27 NOTE — Telephone Encounter (Signed)
Called to discuss with patient about COVID-19 symptoms and the use of one of the available treatments for those with mild to moderate Covid symptoms and at a high risk of hospitalization.  Pt appears to qualify for outpatient treatment due to co-morbid conditions and/or a member of an at-risk group in accordance with the FDA Emergency Use Authorization.    Symptom onset: 01/25/21 per chart Vaccinated: Yes Booster? Yes Immunocompromised? No Qualifiers: History of breast cancer  Unable to reach pt - Left message and call back number [phone].  Dana Chambers

## 2021-01-27 NOTE — Progress Notes (Signed)
Outpatient Oral COVID Treatment Note  I connected with Dana Chambers on 01/27/2021/2:22 PM by telephone and verified that I am speaking with the correct person using two identifiers.  I discussed the limitations, risks, security, and privacy concerns of performing an evaluation and management service by telephone and the availability of in person appointments. I also discussed with the patient that there may be a patient responsible charge related to this service. The patient expressed understanding and agreed to proceed.  Patient location: home  Provider location: home Others participating in call: none  Diagnosis: COVID-19 infection  Purpose of visit: Discussion of potential use of Molnupiravir or Paxlovid, a new treatment for mild to moderate COVID-19 viral infection in non-hospitalized patients.   Subjective: Patient is a 75 y.o. female who has been diagnosed with COVID 19 viral infection.  Their symptoms began on 2/28 with fever/fatigue, achiness.    Past Medical History:  Diagnosis Date  . Basal cell carcinoma of skin    left ankle, right thigh  . Bursitis    right hip  . Cancer [facility] 2006   Breast/right  . Diverticulosis   . GERD (gastroesophageal reflux disease)   . History of breast cancer/33 radiation treatments/ no chemo    2006  S/P RIGHT LUMPECTOMY AND RADIATION FOLLOWED BY DR RUBIN--  NO RECURRENCE  . History of kidney stones   . Hyperplastic colon polyp   . IBS (irritable bowel syndrome)   . Idiopathic peripheral neuropathy    bilateral tingling/ due to fall in [place]  . Internal hemorrhoid   . Kidney stone   . Leiomyoma    Multiple small  . Liver cyst 2006   Benign  . OA (osteoarthritis)   . Osteoporosis 2019   T score -2.5  . Personal history of radiation therapy   . Raynaud disease   . Renal calculus, left    non obstructive  . Rib pain on left side    able to lay on left side  . Rosacea   . Tinnitus     Allergies  Allergen Reactions  .  Amoxicillin Diarrhea    GI upset     Current Outpatient Medications:  .  acetaminophen (TYLENOL) 500 MG tablet, Take 1,000 mg by mouth 3 (three) times daily as needed., Disp: , Rfl:  .  benzonatate (TESSALON PERLES) 100 MG capsule, Take 1 capsule (100 mg total) by mouth 3 (three) times daily as needed., Disp: 20 capsule, Rfl: 0 .  Cholecalciferol (VITAMIN D PO), Take by mouth., Disp: , Rfl:  .  Cyanocobalamin (VITAMIN B 12 PO), Take by mouth., Disp: , Rfl:  .  denosumab (PROLIA) 60 MG/ML SOSY injection, Inject 60 mg into the skin every 6 (six) months., Disp: , Rfl:  .  dextromethorphan-guaiFENesin (MUCINEX DM) 30-600 MG 12hr tablet, Take 1 tablet by mouth 2 (two) times daily., Disp: , Rfl:  .  esomeprazole (NEXIUM) 20 MG capsule, TAKE ONE CAPSULE BY MOUTH EVERY DAY AT NOON, Disp: 90 capsule, Rfl: 3 .  Nirmatrelvir & Ritonavir (PAXLOVID) 20 x 150 MG & 10 x 100MG  TBPK, Take by mouth., Disp: , Rfl:   Objective: Patient appears/sounds moderately well, but tired.  They are in no apparent distress.  Breathing is non labored.  Mood and behavior are normal.  Laboratory Data:  Recent Results (from the past 2160 hour(s))  CBC with Differential/Platelet     Status: None   Collection Time: 12/21/20 11:42 AM  Result Value Ref Range   WBC  7.2 4.0 - 10.5 K/uL   RBC 4.73 3.87 - 5.11 Mil/uL   Hemoglobin 14.5 12.0 - 15.0 g/dL   HCT 42.8 36.0 - 46.0 %   MCV 90.5 78.0 - 100.0 fl   MCHC 33.8 30.0 - 36.0 g/dL   RDW 13.5 11.5 - 15.5 %   Platelets 306.0 150.0 - 400.0 K/uL   Neutrophils Relative % 52.4 43.0 - 77.0 %   Lymphocytes Relative 39.1 12.0 - 46.0 %   Monocytes Relative 7.1 3.0 - 12.0 %   Eosinophils Relative 1.1 0.0 - 5.0 %   Basophils Relative 0.3 0.0 - 3.0 %   Neutro Abs 3.8 1.4 - 7.7 K/uL   Lymphs Abs 2.8 0.7 - 4.0 K/uL   Monocytes Absolute 0.5 0.1 - 1.0 K/uL   Eosinophils Absolute 0.1 0.0 - 0.7 K/uL   Basophils Absolute 0.0 0.0 - 0.1 K/uL  Comprehensive metabolic panel     Status: None    Collection Time: 12/21/20 11:42 AM  Result Value Ref Range   Sodium 140 135 - 145 mEq/L   Potassium 4.4 3.5 - 5.1 mEq/L   Chloride 102 96 - 112 mEq/L   CO2 29 19 - 32 mEq/L   Glucose, Bld 96 70 - 99 mg/dL   BUN 18 6 - 23 mg/dL   Creatinine, Ser 0.83 0.40 - 1.20 mg/dL   Total Bilirubin 0.5 0.2 - 1.2 mg/dL   Alkaline Phosphatase 57 39 - 117 U/L   AST 18 0 - 37 U/L   ALT 12 0 - 35 U/L   Total Protein 7.7 6.0 - 8.3 g/dL   Albumin 4.9 3.5 - 5.2 g/dL   GFR 68.56 >60.00 mL/min    Comment: Calculated using the CKD-EPI Creatinine Equation (2021)   Calcium 9.6 8.4 - 10.5 mg/dL  Lipid panel     Status: Abnormal   Collection Time: 12/21/20 11:42 AM  Result Value Ref Range   Cholesterol 306 (H) 0 - 200 mg/dL    Comment: ATP III Classification       Desirable:  < 200 mg/dL               Borderline High:  200 - 239 mg/dL          High:  > = 240 mg/dL   Triglycerides 119.0 0.0 - 149.0 mg/dL    Comment: Normal:  <150 mg/dLBorderline High:  150 - 199 mg/dL   HDL 82.30 >39.00 mg/dL   VLDL 23.8 0.0 - 40.0 mg/dL   LDL Cholesterol 200 (H) 0 - 99 mg/dL   Total CHOL/HDL Ratio 4     Comment:                Men          Women1/2 Average Risk     3.4          3.3Average Risk          5.0          4.42X Average Risk          9.6          7.13X Average Risk          15.0          11.0                       NonHDL 224.10     Comment: NOTE:  Non-HDL goal should be 30 mg/dL higher than patient's LDL goal (  i.e. LDL goal of < 70 mg/dL, would have non-HDL goal of < 100 mg/dL)  Hemoglobin A1c     Status: None   Collection Time: 12/21/20 11:42 AM  Result Value Ref Range   Hgb A1c MFr Bld 6.0 4.6 - 6.5 %    Comment: Glycemic Control Guidelines for People with Diabetes:Non Diabetic:  <6%Goal of Therapy: <7%Additional Action Suggested:  >8%      Assessment: 75 y.o. female with mild/moderate COVID 19 viral infection diagnosed yesterday at high risk for progression to severe COVID 19.  Plan:  This patient is a 75  y.o. female that meets the following criteria for Emergency Use Authorization of: Paxlovid 1. Age >12 yr AND > 40 kg 2. SARS-COV-2 positive test 3. Symptom onset < 5 days 4. Mild-to-moderate COVID disease with high risk for severe progression to hospitalization or death  I have spoken and communicated the following to the patient or parent/caregiver regarding: 1. Paxlovid is an unapproved drug that is authorized for use under an Emergency Use Authorization.  2. There are no adequate, approved, available products for the treatment of COVID-19 in adults who have mild-to-moderate COVID-19 and are at high risk for progressing to severe COVID-19, including hospitalization or death. 3. Other therapeutics are currently authorized. For additional information on all products authorized for treatment or prevention of COVID-19, please see [email or web address].  4. There are benefits and risks of taking this treatment as outlined in the "Fact Sheet for Patients and Caregivers."  5. "Fact Sheet for Patients and Caregivers" was reviewed with patient. A hard copy will be provided to patient from pharmacy prior to the patient receiving treatment. 6. Patients should continue to self-isolate and use infection control measures (e.g., wear mask, isolate, social distance, avoid sharing personal items, clean and disinfect "high touch" surfaces, and frequent handwashing) according to CDC guidelines.  7. The patient or parent/caregiver has the option to accept or refuse treatment. 8. Patient medication history was reviewed for potential drug interactions:No drug interactions 9. Patient's GFR was calculated to be 68, and they were therefore prescribed Normal dose (GFR>60) - nirmatrelvir 150mg  tab (2 tablet) by mouth twice daily AND ritonavir 100mg  tab (1 tablet) by mouth twice daily   After reviewing above information  with the patient, the patient agrees to receive Paxlovid.  Follow up instructions:    . Take prescription BID x 5 days as directed . Reach out to pharmacist for counseling on medication if desired . For concerns regarding further COVID symptoms please follow up with your PCP or urgent care . For urgent or life-threatening issues, seek care at your local emergency department  The patient was provided an opportunity to ask questions, and all were answered. The patient agreed with the plan and demonstrated an understanding of the instructions.   Script sent to [facility] and opted to pick up RX.  The patient was advised to call their PCP or seek an in-person evaluation if the symptoms worsen or if the condition fails to improve as anticipated.   I provided 20 minutes of non face-to-face telephone visit time during this encounter, and > 50% was spent counseling as documented under my assessment & plan.  Scot Dock, NP 01/27/2021 /2:22 PM

## 2021-01-28 ENCOUNTER — Ambulatory Visit (INDEPENDENT_AMBULATORY_CARE_PROVIDER_SITE_OTHER): Payer: Medicare PPO

## 2021-01-28 DIAGNOSIS — Z0000 Encounter for general adult medical examination without abnormal findings: Secondary | ICD-10-CM

## 2021-01-28 NOTE — Patient Instructions (Addendum)
Ms. Rocha , Thank you for taking time to come for your Medicare Wellness Visit. I appreciate your ongoing commitment to your health goals. Please review the following plan we discussed and let me know if I can assist you in the future.   Screening recommendations/referrals: Colonoscopy: Done 01/26/17 Mammogram: Done 10/21/20 Bone Density: Done 11/06/20 Recommended yearly ophthalmology/optometry visit for glaucoma screening and checkup Recommended yearly dental visit for hygiene and checkup  Vaccinations: Influenza vaccine: Done 07/29/20 Pneumococcal vaccine: Up to date Tdap vaccine: Up to date Shingles vaccine: Completed 4/1 & 05/10/18   Covid-19:Completed 1/25, 2/15, & 07/31/20  Advanced directives: Copies in chart  Conditions/risks identified: continue to exercise more  Next appointment: Follow up in one year for your annual wellness visit   Preventive Care 56 Years and Older, Female Preventive care refers to lifestyle choices and visits with your health care provider that can promote health and wellness. What does preventive care include?  A yearly physical exam. This is also called an annual well check.  Dental exams once or twice a year.  Routine eye exams. Ask your health care provider how often you should have your eyes checked.  Personal lifestyle choices, including:  Daily care of your teeth and gums.  Regular physical activity.  Eating a healthy diet.  Avoiding tobacco and drug use.  Limiting alcohol use.  Practicing safe sex.  Taking low-dose aspirin every day.  Taking vitamin and mineral supplements as recommended by your health care provider. What happens during an annual well check? The services and screenings done by your health care provider during your annual well check will depend on your age, overall health, lifestyle risk factors, and family history of disease. Counseling  Your health care provider may ask you questions about your:  Alcohol  use.  Tobacco use.  Drug use.  Emotional well-being.  Home and relationship well-being.  Sexual activity.  Eating habits.  History of falls.  Memory and ability to understand (cognition).  Work and work Statistician.  Reproductive health. Screening  You may have the following tests or measurements:  Height, weight, and BMI.  Blood pressure.  Lipid and cholesterol levels. These may be checked every 5 years, or more frequently if you are over 30 years old.  Skin check.  Lung cancer screening. You may have this screening every year starting at age 54 if you have a 30-pack-year history of smoking and currently smoke or have quit within the past 15 years.  Fecal occult blood test (FOBT) of the stool. You may have this test every year starting at age 31.  Flexible sigmoidoscopy or colonoscopy. You may have a sigmoidoscopy every 5 years or a colonoscopy every 10 years starting at age 66.  Hepatitis C blood test.  Hepatitis B blood test.  Sexually transmitted disease (STD) testing.  Diabetes screening. This is done by checking your blood sugar (glucose) after you have not eaten for a while (fasting). You may have this done every 1-3 years.  Bone density scan. This is done to screen for osteoporosis. You may have this done starting at age 42.  Mammogram. This may be done every 1-2 years. Talk to your health care provider about how often you should have regular mammograms. Talk with your health care provider about your test results, treatment options, and if necessary, the need for more tests. Vaccines  Your health care provider may recommend certain vaccines, such as:  Influenza vaccine. This is recommended every year.  Tetanus, diphtheria, and acellular  pertussis (Tdap, Td) vaccine. You may need a Td booster every 10 years.  Zoster vaccine. You may need this after age 60.  Pneumococcal 13-valent conjugate (PCV13) vaccine. One dose is recommended after age  58.  Pneumococcal polysaccharide (PPSV23) vaccine. One dose is recommended after age 57. Talk to your health care provider about which screenings and vaccines you need and how often you need them. This information is not intended to replace advice given to you by your health care provider. Make sure you discuss any questions you have with your health care provider. Document Released: 12/11/2015 Document Revised: 08/03/2016 Document Reviewed: 09/15/2015 Elsevier Interactive Patient Education  2017 [place] Prevention in the Home Falls can cause injuries. They can happen to people of all ages. There are many things you can do to make your home safe and to help prevent falls. What can I do on the outside of my home?  Regularly fix the edges of walkways and driveways and fix any cracks.  Remove anything that might make you trip as you walk through a door, such as a raised step or threshold.  Trim any bushes or trees on the path to your home.  Use bright outdoor lighting.  Clear any walking paths of anything that might make someone trip, such as rocks or tools.  Regularly check to see if handrails are loose or broken. Make sure that both sides of any steps have handrails.  Any raised decks and porches should have guardrails on the edges.  Have any leaves, snow, or ice cleared regularly.  Use sand or salt on walking paths during winter.  Clean up any spills in your garage right away. This includes oil or grease spills. What can I do in the bathroom?  Use night lights.  Install grab bars by the toilet and in the tub and shower. Do not use towel bars as grab bars.  Use non-skid mats or decals in the tub or shower.  If you need to sit down in the shower, use a plastic, non-slip stool.  Keep the floor dry. Clean up any water that spills on the floor as soon as it happens.  Remove soap buildup in the tub or shower regularly.  Attach bath mats securely with double-sided  non-slip rug tape.  Do not have throw rugs and other things on the floor that can make you trip. What can I do in the bedroom?  Use night lights.  Make sure that you have a light by your bed that is easy to reach.  Do not use any sheets or blankets that are too big for your bed. They should not hang down onto the floor.  Have a firm chair that has side arms. You can use this for support while you get dressed.  Do not have throw rugs and other things on the floor that can make you trip. What can I do in the kitchen?  Clean up any spills right away.  Avoid walking on wet floors.  Keep items that you use a lot in easy-to-reach places.  If you need to reach something above you, use a strong step stool that has a grab bar.  Keep electrical cords out of the way.  Do not use floor polish or wax that makes floors slippery. If you must use wax, use non-skid floor wax.  Do not have throw rugs and other things on the floor that can make you trip. What can I do with my stairs?  Do not leave any items on the stairs.  Make sure that there are handrails on both sides of the stairs and use them. Fix handrails that are broken or loose. Make sure that handrails are as long as the stairways.  Check any carpeting to make sure that it is firmly attached to the stairs. Fix any carpet that is loose or worn.  Avoid having throw rugs at the top or bottom of the stairs. If you do have throw rugs, attach them to the floor with carpet tape.  Make sure that you have a light switch at the top of the stairs and the bottom of the stairs. If you do not have them, ask someone to add them for you. What else can I do to help prevent falls?  Wear shoes that:  Do not have high heels.  Have rubber bottoms.  Are comfortable and fit you well.  Are closed at the toe. Do not wear sandals.  If you use a stepladder:  Make sure that it is fully opened. Do not climb a closed stepladder.  Make sure that both  sides of the stepladder are locked into place.  Ask someone to hold it for you, if possible.  Clearly mark and make sure that you can see:  Any grab bars or handrails.  First and last steps.  Where the edge of each step is.  Use tools that help you move around (mobility aids) if they are needed. These include:  Canes.  Walkers.  Scooters.  Crutches.  Turn on the lights when you go into a dark area. Replace any light bulbs as soon as they burn out.  Set up your furniture so you have a clear path. Avoid moving your furniture around.  If any of your floors are uneven, fix them.  If there are any pets around you, be aware of where they are.  Review your medicines with your doctor. Some medicines can make you feel dizzy. This can increase your chance of falling. Ask your doctor what other things that you can do to help prevent falls. This information is not intended to replace advice given to you by your health care provider. Make sure you discuss any questions you have with your health care provider. Document Released: 09/10/2009 Document Revised: 04/21/2016 Document Reviewed: 12/19/2014 Elsevier Interactive Patient Education  2017 [employer].

## 2021-01-28 NOTE — Progress Notes (Addendum)
Virtual Visit via Telephone Note  I connected with  Dana Chambers on 01/28/21 at  8:00 AM EST by telephone and verified that I am speaking with the correct person using two identifiers.  Medicare Annual Wellness visit completed telephonically due to Covid-19 pandemic.   Persons participating in this call: This Health Coach and this patient.    Location: Patient: Home Provider: Office    I discussed the limitations, risks, security and privacy concerns of performing an evaluation and management service by telephone and the availability of in person appointments. The patient expressed understanding and agreed to proceed.  Unable to perform video visit due to video visit attempted and failed and/or patient does not have video capability.   Some vital signs may be absent or patient reported.   Willette Brace, LPN    Subjective:   Dana Chambers is a 75 y.o. female who presents for Medicare Annual (Subsequent) preventive examination.  Review of Systems     Cardiac Risk Factors include: advanced age (>24men, >65 women);dyslipidemia     Objective:    There were no vitals filed for this visit. There is no height or weight on file to calculate BMI.  Advanced Directives 01/28/2021 06/22/2018 08/10/2017 06/07/2017 01/26/2017 05/20/2016  Does Patient Have a Medical Advance Directive? Yes Yes Yes Yes Yes Yes  Type of Academic librarian - Living will [place];Living will - -  Does patient want to make changes to medical advance directive? - - - Yes (MAU/Ambulatory/Procedural Areas - Information given) - -  Copy of [place] in Chart? Yes - validated most recent copy scanned in chart (See row information) - - No - copy requested - No - copy requested    Current Medications (verified) Outpatient Encounter Medications as of 01/28/2021  Medication Sig  . acetaminophen (TYLENOL) 500 MG tablet Take 1,000 mg by mouth 3 (three)  times daily as needed.  . Cholecalciferol (VITAMIN D PO) Take by mouth.  . Cyanocobalamin (VITAMIN B 12 PO) Take by mouth.  . denosumab (PROLIA) 60 MG/ML SOSY injection Inject 60 mg into the skin every 6 (six) months.  . esomeprazole (NEXIUM) 20 MG capsule TAKE ONE CAPSULE BY MOUTH EVERY DAY AT NOON  . nirmatrelvir/ritonavir EUA (PAXLOVID) TABS Take 3 tablets by mouth 2 (two) times daily for 5 days. Patient GFR is 68. Take nirmatrelvir (150 mg) 2 tablet(s) twice daily for 5 days and ritonavir (100 mg) one tablet twice daily for 5 days.  . benzonatate (TESSALON PERLES) 100 MG capsule Take 1 capsule (100 mg total) by mouth 3 (three) times daily as needed. (Patient not taking: Reported on 01/28/2021)  . dextromethorphan-guaiFENesin (MUCINEX DM) 30-600 MG 12hr tablet Take 1 tablet by mouth 2 (two) times daily. (Patient not taking: Reported on 01/28/2021)   No facility-administered encounter medications on file as of 01/28/2021.    Allergies (verified) Amoxicillin   History: Past Medical History:  Diagnosis Date  . Basal cell carcinoma of skin    left ankle, right thigh  . Bursitis    right hip  . Cancer [facility]) 2006   Breast/right  . COVID 01/27/2021  . Diverticulosis   . GERD (gastroesophageal reflux disease)   . History of breast cancer/33 radiation treatments/ no chemo    2006  S/P RIGHT LUMPECTOMY AND RADIATION FOLLOWED BY DR RUBIN--  NO RECURRENCE  . History of kidney stones   . Hyperplastic colon polyp   . IBS (irritable  bowel syndrome)   . Idiopathic peripheral neuropathy    bilateral tingling/ due to fall in [place]  . Internal hemorrhoid   . Kidney stone   . Leiomyoma    Multiple small  . Liver cyst 2006   Benign  . OA (osteoarthritis)   . Osteoporosis 2019   T score -2.5  . Personal history of radiation therapy   . Raynaud disease   . Renal calculus, left    non obstructive  . Rib pain on left side    able to lay on left side  . Rosacea   . Tinnitus    Past Surgical  History:  Procedure Laterality Date  . BREAST BIOPSY Left 2016   benign  . BREAST EXCISIONAL BIOPSY Right 1973   benign  . BREAST LUMPECTOMY  2006    Right -- S/P RADIATION--  NO RECURRENCE  . DILATION AND CURETTAGE OF UTERUS    . EXTRACORPOREAL SHOCK WAVE LITHOTRIPSY     X2  . HYSTEROSCOPY    . HYSTEROSCOPY WITH D & C  08/03/2012   Procedure: DILATATION AND CURETTAGE /HYSTEROSCOPY;  Surgeon: Anastasio Auerbach, MD;  Location: [place];  Service: Gynecology;  Laterality: N/A;  . SUPRANUMERARY NIPPLE EXCISION  1973   CYST  . TONSILLECTOMY  AGE 26   Family History  Problem Relation Age of Onset  . Dementia Mother   . Depression Mother   . Osteoporosis Mother   . Stroke Father   . Heart attack Father 43  . Rectal cancer Maternal Grandmother   . Colon cancer Maternal Grandmother   . Stroke Sister        TIA. ? dementia. another stroke after covid then feeding issues led to eath  . Dementia Sister   . Breast cancer Neg Hx    Social History   Socioeconomic History  . Marital status: Married    Spouse name: Not on file  . Number of children: 1  . Years of education: Not on file  . Highest education level: Not on file  Occupational History  . Occupation: Retired-Teacher  Tobacco Use  . Smoking status: Never Smoker  . Smokeless tobacco: Never Used  Vaping Use  . Vaping Use: Never used  Substance and Sexual Activity  . Alcohol use: Yes    Alcohol/week: 7.0 standard drinks    Types: 7 Standard drinks or equivalent per week  . Drug use: No  . Sexual activity: Not Currently    Birth control/protection: Post-menopausal    Comment: 1st intercourse 75 yo-Fewer than 5 partners  Other Topics Concern  . Not on file  Social History Narrative   Married (husband patient of Dr. Yong Channel). 1 daughter. 1 granddaughter.       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       Hobbies: reading, travel, time with granddaughter. Very active   Social Determinants of Health    Financial Resource Strain: Low Risk   . Difficulty of Paying Living Expenses: Not hard at all  Food Insecurity: No Food Insecurity  . Worried About Charity fundraiser in the Last Year: Never true  . Ran Out of Food in the Last Year: Never true  Transportation Needs: No Transportation Needs  . Lack of Transportation (Medical): No  . Lack of Transportation (Non-Medical): No  Physical Activity: Sufficiently Active  . Days of Exercise per Week: 4 days  . Minutes of Exercise per Session: 50 min  Stress: No Stress Concern Present  .  Feeling of Stress : Only a little  Social Connections: Unknown  . Frequency of Communication with Friends and Family: More than three times a week  . Frequency of Social Gatherings with Friends and Family: Twice a week  . Attends Religious Services: Not on file  . Active Member of Clubs or Organizations: Yes  . Attends Archivist Meetings: 1 to 4 times per year  . Marital Status: Married    Tobacco Counseling Counseling given: Not Answered   Clinical Intake:  Pre-visit preparation completed: Yes        BMI - recorded: 22.93 Nutritional Status: BMI of 19-24  Normal Nutritional Risks: None Diabetes: No  How often do you need to have someone help you when you read instructions, pamphlets, or other written materials from your doctor or pharmacy?: 1 - Never  Diabetic?No  Interpreter Needed?: No  Information entered by :: Charlott Rakes, LPN   Activities of Daily Living In your present state of health, do you have any difficulty performing the following activities: 01/28/2021  Hearing? Y  Comment mild loss  Vision? N  Difficulty concentrating or making decisions? Y  Comment recall at times  Walking or climbing stairs? N  Dressing or bathing? N  Doing errands, shopping? N  Preparing Food and eating ? N  Using the Toilet? N  In the past six months, have you accidently leaked urine? Y  Comment with coughing on occassion, wears a  pad at times  Do you have problems with loss of bowel control? N  Managing your Medications? N  Managing your Finances? N  Housekeeping or managing your Housekeeping? N  Some recent data might be hidden    Patient Care Team: Marin Olp, MD as PCP - General (Family Medicine) Rolm Bookbinder, MD as Consulting Physician (Dermatology) Philemon Kingdom, MD as Consulting Physician (Endocrinology) Fontaine, Belinda Block, MD (Inactive) as Consulting Physician (Gynecology) Mauri Pole, MD as Consulting Physician (Gastroenterology) Monna Fam, MD as Consulting Physician (Ophthalmology)  Indicate any recent Medical Services you may have received from other than [facility] providers in the past year (date may be approximate).     Assessment:   This is a routine wellness examination for [facility].  Hearing/Vision screen  Hearing Screening   125Hz  250Hz  500Hz  1000Hz  2000Hz  3000Hz  4000Hz  6000Hz  8000Hz   Right ear:           Left ear:           Comments:  mild loss  Vision Screening Comments: Pt follows up with Dr Baldemar Lenis for annual eye exams   Dietary issues and exercise activities discussed: Current Exercise Habits: Structured exercise class, Type of exercise: Other - see comments;walking (water walking), Time (Minutes): 50, Frequency (Times/Week): 2, Weekly Exercise (Minutes/Week): 100  Goals    . Exercise 150 minutes per week (moderate activity)     Continue with physical exercise;  Could use another day of gym; will walk more but will explore other    . Patient Stated     Better diet to lower chol Plant based diet  will get hearing checked Possibly try to come off Nexium    . Patient Stated     Continue exercising more       Depression Screen PHQ 2/9 Scores 01/28/2021 12/21/2020 01/22/2020 07/08/2019 12/12/2018 06/22/2018 06/07/2017  PHQ - 2 Score 0 0 0 0 0 0 0    Fall Risk Fall Risk  01/28/2021 12/21/2020 07/29/2020 12/18/2019 07/08/2019  Falls in the past year?  1 0 0 0 1   Number falls in past yr: 1 0 0 0 0  Injury with Fall? 1 0 0 0 0  Comment bruises and injured ear - - - -  Risk for fall due to : Impaired vision;Impaired balance/gait - - - History of fall(s)  Follow up Falls prevention discussed - - - Education provided    FALL RISK PREVENTION PERTAINING TO THE HOME:  Any stairs in or around the home? Yes  If so, are there any without handrails? No  Home free of loose throw rugs in walkways, pet beds, electrical cords, etc? Yes  Adequate lighting in your home to reduce risk of falls? Yes   ASSISTIVE DEVICES UTILIZED TO PREVENT FALLS:  Life alert? No  Use of a cane, walker or w/c? No  Grab bars in the bathroom? No  Shower chair or bench in shower? No  Elevated toilet seat or a handicapped toilet? No   TIMED UP AND GO:  Was the test performed? No     Cognitive Function: MMSE - Mini Mental State Exam 06/22/2018 05/20/2016  Not completed: (No Data) (No Data)     6CIT Screen 01/28/2021  What Year? 0 points  What month? 0 points  Count back from 20 0 points  Months in reverse 0 points  Repeat phrase 0 points    Immunizations Immunization History  Administered Date(s) Administered  . Fluad Quad(high Dose 65+) 07/29/2020  . Influenza Whole 10/29/2009, 08/11/2010, 08/19/2011  . Influenza, High Dose Seasonal PF 09/29/2016, 08/21/2017, 08/01/2018, 07/15/2019, 07/29/2020  . Influenza,inj,Quad PF,6+ Mos 08/21/2013, 09/02/2014  . Influenza-Unspecified 09/09/2015, 08/01/2018, 07/17/2019  . PFIZER(Purple Top)SARS-COV-2 Vaccination 12/23/2019, 01/13/2020, 07/31/2020  . Pneumococcal Conjugate-13 09/02/2014  . Pneumococcal Polysaccharide-23 03/06/2012  . Tdap 03/06/2012  . Zoster Recombinat (Shingrix) 03/10/2018, 05/10/2018    TDAP status: Up to date  Flu Vaccine status: Up to date  Pneumococcal vaccine status: Up to date  Covid-19 vaccine status: Completed vaccines  Qualifies for Shingles Vaccine? Yes   Zostavax completed Yes   Shingrix  Completed?: Yes  Screening Tests Health Maintenance  Topic Date Due  . MAMMOGRAM  10/12/2021  . COLONOSCOPY (Pts 45-49yrs Insurance coverage will need to be confirmed)  01/26/2022  . TETANUS/TDAP  03/06/2022  . INFLUENZA VACCINE  Completed  . DEXA SCAN  Completed  . COVID-19 Vaccine  Completed  . Hepatitis C Screening  Completed  . PNA vac Low Risk Adult  Completed  . HPV VACCINES  Aged Out    Health Maintenance  There are no preventive care reminders to display for this patient.  Colorectal cancer screening: Type of screening: Colonoscopy. Completed 01/26/17. Repeat every 5 years  Mammogram status: Completed 10/21/20. Repeat every year  Bone Density status: Completed 11/06/20. Results reflect: Bone density results: OSTEOPOROSIS. Repeat every 2 years.  Additional Screening:  Hepatitis C Screening: Completed 12/06/17  Vision Screening: Recommended annual ophthalmology exams for early detection of glaucoma and other disorders of the eye. Is the patient up to date with their annual eye exam?  Yes  Who is the provider or what is the name of the office in which the patient attends annual eye exams? Dr Baldemar Lenis If pt is not established with a provider, would they like to be referred to a provider to establish care? No .   Dental Screening: Recommended annual dental exams for proper oral hygiene  Community Resource Referral / Chronic Care Management: CRR required this visit?  No   CCM required this  visit?  No      Plan:     I have personally reviewed and noted the following in the patient's chart:   . Medical and social history . Use of alcohol, tobacco or illicit drugs  . Current medications and supplements . Functional ability and status . Nutritional status . Physical activity . Advanced directives . List of other physicians . Hospitalizations, surgeries, and ER visits in previous 12 months . Vitals . Screenings to include cognitive, depression, and  falls . Referrals and appointments  In addition, I have reviewed and discussed with patient certain preventive protocols, quality metrics, and best practice recommendations. A written personalized care plan for preventive services as well as general preventive health recommendations were provided to patient.     Willette Brace, LPN   06/01/7339   Nurse Notes: Pt tested positive for covid 01/27/21 and started taking Paxlovid and now stated she is having a metallic taste in her mouth, no rash, no itching, and no SOB noted please advise.

## 2021-02-01 ENCOUNTER — Encounter: Payer: Self-pay | Admitting: Family Medicine

## 2021-02-03 DIAGNOSIS — M810 Age-related osteoporosis without current pathological fracture: Secondary | ICD-10-CM | POA: Diagnosis not present

## 2021-02-03 DIAGNOSIS — M25512 Pain in left shoulder: Secondary | ICD-10-CM | POA: Diagnosis not present

## 2021-02-03 DIAGNOSIS — M4003 Postural kyphosis, cervicothoracic region: Secondary | ICD-10-CM | POA: Diagnosis not present

## 2021-02-03 DIAGNOSIS — M5451 Vertebrogenic low back pain: Secondary | ICD-10-CM | POA: Diagnosis not present

## 2021-02-03 DIAGNOSIS — M9902 Segmental and somatic dysfunction of thoracic region: Secondary | ICD-10-CM | POA: Diagnosis not present

## 2021-02-03 DIAGNOSIS — M9908 Segmental and somatic dysfunction of rib cage: Secondary | ICD-10-CM | POA: Diagnosis not present

## 2021-02-03 DIAGNOSIS — M9907 Segmental and somatic dysfunction of upper extremity: Secondary | ICD-10-CM | POA: Diagnosis not present

## 2021-02-03 DIAGNOSIS — M542 Cervicalgia: Secondary | ICD-10-CM | POA: Diagnosis not present

## 2021-02-03 DIAGNOSIS — M546 Pain in thoracic spine: Secondary | ICD-10-CM | POA: Diagnosis not present

## 2021-02-08 ENCOUNTER — Ambulatory Visit (INDEPENDENT_AMBULATORY_CARE_PROVIDER_SITE_OTHER): Payer: Medicare PPO | Admitting: Physical Therapy

## 2021-02-08 ENCOUNTER — Encounter: Payer: Self-pay | Admitting: Physical Therapy

## 2021-02-08 ENCOUNTER — Other Ambulatory Visit: Payer: Self-pay

## 2021-02-08 DIAGNOSIS — M542 Cervicalgia: Secondary | ICD-10-CM | POA: Diagnosis not present

## 2021-02-08 DIAGNOSIS — M25512 Pain in left shoulder: Secondary | ICD-10-CM

## 2021-02-08 NOTE — Therapy (Addendum)
[facility] [address] [place], [place], [address] Phone: [phone]   Fax:  [phone]  Physical Therapy Evaluation  Patient Details  Name: Dana Chambers MRN: [id number] Date of Birth: Apr 14, 1946 Referring Provider (PT): Teresa Coombs   Encounter Date: 02/08/2021   PT End of Session - 02/21/21 2203    Visit Number 1    Number of Visits 12    Date for PT Re-Evaluation 03/22/21    Authorization Type [place]    PT Start Time 1100    PT Stop Time 1145    PT Time Calculation (min) 45 min    Activity Tolerance Patient tolerated treatment well    Behavior During Therapy [facility] for tasks assessed/performed           Past Medical History:  Diagnosis Date  . Basal cell carcinoma of skin    left ankle, right thigh  . Bursitis    right hip  . Cancer [facility]) 2006   Breast/right  . COVID 01/27/2021  . Diverticulosis   . GERD (gastroesophageal reflux disease)   . History of breast cancer/33 radiation treatments/ no chemo    2006  S/P RIGHT LUMPECTOMY AND RADIATION FOLLOWED BY DR RUBIN--  NO RECURRENCE  . History of kidney stones   . Hyperplastic colon polyp   . IBS (irritable bowel syndrome)   . Idiopathic peripheral neuropathy    bilateral tingling/ due to fall in [place]  . Internal hemorrhoid   . Kidney stone   . Leiomyoma    Multiple small  . Liver cyst 2006   Benign  . OA (osteoarthritis)   . Osteoporosis 2019   T score -2.5  . Personal history of radiation therapy   . Raynaud disease   . Renal calculus, left    non obstructive  . Rib pain on left side    able to lay on left side  . Rosacea   . Tinnitus     Past Surgical History:  Procedure Laterality Date  . BREAST BIOPSY Left 2016   benign  . BREAST EXCISIONAL BIOPSY Right 1973   benign  . BREAST LUMPECTOMY  2006    Right -- S/P RADIATION--  NO RECURRENCE  . DILATION AND CURETTAGE OF UTERUS    . EXTRACORPOREAL SHOCK WAVE LITHOTRIPSY     X2  .  HYSTEROSCOPY    . HYSTEROSCOPY WITH D & C  08/03/2012   Procedure: DILATATION AND CURETTAGE /HYSTEROSCOPY;  Surgeon: Anastasio Auerbach, MD;  Location: [facility];  Service: Gynecology;  Laterality: N/A;  . SUPRANUMERARY NIPPLE EXCISION  1973   CYST  . TONSILLECTOMY  AGE 75    There were no vitals filed for this visit.    Subjective Assessment - 02/21/21 2202    Subjective Pt states pain in L posterior shoulder and thoracic region. Also has some pain in center of Neck. reports OP and scoliosis. States recent covid, and taking anti-virals up until this week. States mild dizziness in AMs, she associates with this. Also states mid back "tingling" at night at times, and "neuropathy" into bil LEs, in thighs. Most painful place along medial scap border, states less pain today. Notes increased pain with shoulder ROM/elevation.    Pertinent History Osteoporosis, Scoliosis, previous breast CA (radiation on R side)    Limitations Lifting    Currently in Pain? Yes    Pain Score 4     Pain Location Shoulder    Pain Orientation Left  Pain Descriptors / Indicators Aching    Pain Type Acute pain    Pain Onset More than a month ago    Pain Frequency Intermittent    Aggravating Factors  increased shoulder ROM, use,              [facility] PT Assessment - 02/21/21 0001      Assessment   Medical Diagnosis upper back pain, L    Referring Provider (PT) Teresa Coombs    Prior Therapy no      Precautions   Precautions None      Balance Screen   Has the patient fallen in the past 6 months No      Prior Function   Level of Independence Independent      Cognition   Overall Cognitive Status Within Functional Limits for tasks assessed      ROM / Strength   AROM / PROM / Strength AROM;Strength      AROM   Overall AROM Comments Shoulder ROM: WFL, soreness with full elevation, in med scap border.; Cervical ROM: WNL      Strength   Overall Strength Comments Shoulder: 4+/ 5       Palpation   Palpation comment Tenderness at L rhomboid, teres, infraspinatus, levator, and tightness in L UT      Special Tests   Other special tests No UE radicular symptoms                      Objective measurements completed on examination: See above findings.       [facility] Adult PT Treatment/Exercise - 02/21/21 0001      Exercises   Exercises Shoulder      Shoulder Exercises: Supine   Flexion 15 reps;AAROM    Flexion Limitations cane      Shoulder Exercises: Seated   Retraction 10 reps      Shoulder Exercises: Stretch   Corner Stretch 3 reps;30 seconds    Other Shoulder Stretches UT and levator stretches 30 sec x 3 ea bil;    Other Shoulder Stretches posterior shoulder stretch 30 sec x 2 bil;      Manual Therapy   Soft tissue mobilization STM/ TPR to L rhomboid, levator, UT,                  PT Education - 02/21/21 2203    Education Details PT POC, Exam findings, HEP    Person(s) Educated Patient    Methods Explanation;Demonstration;Tactile cues;Verbal cues;Handout    Comprehension Verbalized understanding;Returned demonstration;Verbal cues required;Tactile cues required;Need further instruction            PT Short Term Goals - 02/21/21 2205      PT SHORT TERM GOAL #1   Title Pt to be independent with initial HEP    Time 2    Period Weeks    Status New    Target Date 02/22/21             PT Long Term Goals - 02/21/21 2206      PT LONG TERM GOAL #1   Title Pt to be independent with final HEP    Time 6    Period Weeks    Status New    Target Date 03/22/21      PT LONG TERM GOAL #2   Title Pt to report decreased pain in L shoulder/blade region to 0-2/10 with UE activitiy    Time 6    Period Weeks  Status New    Target Date 03/22/21      PT LONG TERM GOAL #3   Title Pt to demo ability for exercise and strengthening with UEs, without pain in L shoulder/blade region.    Time 6    Period Weeks    Status New    Target  Date 03/22/21                  Plan - 02/21/21 2210    Clinical Impression Statement Pt presents with primary complaint of increased pain in L thoracic region. She has increased pain and tenderness at medial scap border, into rhomboid and levator. Pt with mild increase in pain with full shoulder elevation. Pt with decreased ability for full functional activiites, IADLS, and ADLS, due to increased pain. Pt also reports other symptoms of tingling into mid t-spine, as well as in bil thighs and LEs at times. DIscussed need to f/u with MD for this, as this does not seem like it correlates to pts soft tissue restrictions in posterior shoulder and scapular region. Pt to benefit form skilled PT to improve muscle tension and pain.    Personal Factors and Comorbidities Comorbidity 1    Comorbidities OP, Scoliosis,    Examination-Activity Limitations Reach Overhead;Carry;Lift;Dressing    Examination-Participation Restrictions Meal Prep;Cleaning;Community Activity;Shop;Driving;Laundry    Stability/Clinical Decision Making Stable/Uncomplicated    Clinical Decision Making Low    Rehab Potential Good    PT Frequency 2x / week    PT Duration 6 weeks    PT Treatment/Interventions ADLs/Self Care Home Management;Cryotherapy;Electrical Stimulation;Iontophoresis 4mg /ml Dexamethasone;Moist Heat;Traction;Ultrasound;DME Instruction;Neuromuscular re-education;Stair training;Functional mobility training;Therapeutic activities;Therapeutic exercise;Manual techniques;Patient/family education;Passive range of motion;Dry needling;Taping;Spinal Manipulations;Joint Manipulations    Consulted and Agree with Plan of Care Patient           Patient will benefit from skilled therapeutic intervention in order to improve the following deficits and impairments:  Increased muscle spasms,Decreased activity tolerance,Pain,Impaired flexibility,Improper body mechanics,Decreased mobility,Decreased strength  Visit Diagnosis: Acute  pain of left shoulder  Cervicalgia     Problem List Patient Active Problem List   Diagnosis Date Noted  . Hyperlipidemia- february 2020 coronary Calcium score of 0 12/12/2018  . Vitamin D insufficiency 08/13/2018  . Elevated glycosylated hemoglobin 08/13/2018  . Rib pain on left side 03/21/2017  . Scoliosis 03/21/2017  . Hearing loss associated with syndrome of both ears 12/05/2016  . Neuropathy 03/06/2012  . Osteoporosis 06/29/2009  . 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 COLONIC POLYPS 05/25/2006  . GERD 05/01/1999   Lyndee Hensen, PT, DPT 10:24 PM  02/21/21    [facility] [place], [place], [address] Phone: [phone]   Fax:  [phone]  Name: Dana Chambers MRN: [id number] Date of Birth: 03/28/1946

## 2021-02-09 ENCOUNTER — Encounter: Payer: Self-pay | Admitting: Physical Therapy

## 2021-02-10 ENCOUNTER — Other Ambulatory Visit: Payer: Self-pay

## 2021-02-10 ENCOUNTER — Ambulatory Visit: Payer: Medicare PPO | Admitting: Physical Therapy

## 2021-02-10 DIAGNOSIS — M25512 Pain in left shoulder: Secondary | ICD-10-CM | POA: Diagnosis not present

## 2021-02-10 DIAGNOSIS — M542 Cervicalgia: Secondary | ICD-10-CM

## 2021-02-10 NOTE — Patient Instructions (Signed)
Access Code: [email or web address] URL: [email or web address] Date: 02/10/2021 Prepared by: Lyndee Hensen  Exercises Seated Upper Trapezius Stretch - 2 x daily - 3 reps - 30 hold Seated Levator Scapulae Stretch - 2 x daily - 3 reps - 30 hold Standing Shoulder Posterior Capsule Stretch - 2 x daily - 3 reps - 30 hold Supine Shoulder Flexion with Dowel - 1 x daily - 1 sets - 10 reps Doorway Pec Stretch at 60 Elevation - 2 x daily - 3 reps - 30 hold Seated Scapular Retraction - 1 x daily - 1-2 sets - 10 reps

## 2021-02-16 ENCOUNTER — Encounter: Payer: Medicare PPO | Admitting: Physical Therapy

## 2021-02-16 ENCOUNTER — Telehealth: Payer: Self-pay

## 2021-02-16 NOTE — Telephone Encounter (Signed)
Patient's husband called in stating they wanted to cancel the appointment for Dana Chambers this afternoon as [name] isnt feeling well. She has been experiencing nausea, blood pressure was higher than normal (pt's husband did not provide bp reading) but wanted to know what they should do. Offered an appointment this afternoon but they declined as they want something sooner.

## 2021-02-16 NOTE — Telephone Encounter (Signed)
Blood pressures overall do not look that bad.  Nausea and burping can be multiple things but reflux is a common 1-if she consistently taking her Nexium?  Could try Gas-X as well or Pepcid in addition to Nexium.  Some of her other symptoms could have a mild viral illness.  Following noted she had COVID please remind me-if greater than 2 months reasonable to get retested  In regards to the neck and arm problem-would she like to get sports medicine opinion at this point?

## 2021-02-16 NOTE — Telephone Encounter (Signed)
Called and lm for pt tcb. 

## 2021-02-16 NOTE — Telephone Encounter (Signed)
Patient is aware of Dr. Ronney Lion comments. Gave a verbal understanding.

## 2021-02-16 NOTE — Telephone Encounter (Signed)
Spoke with Dana Chambers she states that she's been having an on going neck and arm problem that she last seen you about in January. She states that since yesterday she's been feeling nauseated and burping all morning. Her blood pressure has been elevated this morning her first reading 140/89 was at 6:33am, then at 7:49 it elevated to 154/97 and her latest reading at 9:00 was 129/83. She's having some tingling down her spin, she's light headed and her back feels a little chilly. She states that she has had covid recently and she just wants to know what she needs to do.

## 2021-02-18 ENCOUNTER — Encounter: Payer: Self-pay | Admitting: Physical Therapy

## 2021-02-18 ENCOUNTER — Ambulatory Visit (INDEPENDENT_AMBULATORY_CARE_PROVIDER_SITE_OTHER): Payer: Medicare PPO | Admitting: Physical Therapy

## 2021-02-18 ENCOUNTER — Other Ambulatory Visit: Payer: Self-pay

## 2021-02-18 DIAGNOSIS — M546 Pain in thoracic spine: Secondary | ICD-10-CM

## 2021-02-18 DIAGNOSIS — M25512 Pain in left shoulder: Secondary | ICD-10-CM

## 2021-02-18 NOTE — Patient Instructions (Signed)
Access Code: [email or web address] URL: [email or web address] Date: 02/18/2021 Prepared by: Lyndee Hensen  Exercises Seated Upper Trapezius Stretch - 2 x daily - 3 reps - 30 hold Standing Shoulder Posterior Capsule Stretch - 2 x daily - 3 reps - 30 hold Supine Shoulder Flexion with Dowel - 1 x daily - 1 sets - 10 reps Doorway Pec Stretch at 60 Elevation - 2 x daily - 3 reps - 30 hold Standing Scapular Retraction - 1 x daily - 1 sets - 10 reps

## 2021-02-21 ENCOUNTER — Encounter: Payer: Self-pay | Admitting: Physical Therapy

## 2021-02-21 NOTE — Therapy (Signed)
[facility] [address] [place], [place], [address] Phone: [phone]   Fax:  [phone]  Physical Therapy Treatment  Patient Details  Name: Dana Chambers MRN: [id number] Date of Birth: [birth year] Referring Provider (PT): Teresa Coombs   Encounter Date: 02/18/2021   PT End of Session - 02/21/21 2235    Visit Number 3    Number of Visits 12    Date for PT Re-Evaluation 03/22/21    Authorization Type [place]    PT Start Time 1103    PT Stop Time 1138    PT Time Calculation (min) 35 min    Activity Tolerance Patient tolerated treatment well    Behavior During Therapy [facility] for tasks assessed/performed           Past Medical History:  Diagnosis Date  . Basal cell carcinoma of skin    left ankle, right thigh  . Bursitis    right hip  . Cancer [facility]) 2006   Breast/right  . COVID 01/27/2021  . Diverticulosis   . GERD (gastroesophageal reflux disease)   . History of breast cancer/33 radiation treatments/ no chemo    2006  S/P RIGHT LUMPECTOMY AND RADIATION FOLLOWED BY DR RUBIN--  NO RECURRENCE  . History of kidney stones   . Hyperplastic colon polyp   . IBS (irritable bowel syndrome)   . Idiopathic peripheral neuropathy    bilateral tingling/ due to fall in [place]  . Internal hemorrhoid   . Kidney stone   . Leiomyoma    Multiple small  . Liver cyst 2006   Benign  . OA (osteoarthritis)   . Osteoporosis 2019   T score -2.5  . Personal history of radiation therapy   . Raynaud disease   . Renal calculus, left    non obstructive  . Rib pain on left side    able to lay on left side  . Rosacea   . Tinnitus     Past Surgical History:  Procedure Laterality Date  . BREAST BIOPSY Left 2016   benign  . BREAST EXCISIONAL BIOPSY Right 1973   benign  . BREAST LUMPECTOMY  2006    Right -- S/P RADIATION--  NO RECURRENCE  . DILATION AND CURETTAGE OF UTERUS    . EXTRACORPOREAL SHOCK WAVE LITHOTRIPSY     X2  .  HYSTEROSCOPY    . HYSTEROSCOPY WITH D & C  08/03/2012   Procedure: DILATATION AND CURETTAGE /HYSTEROSCOPY;  Surgeon: Anastasio Auerbach, MD;  Location: [place];  Service: Gynecology;  Laterality: N/A;  . SUPRANUMERARY NIPPLE EXCISION  1973   CYST  . TONSILLECTOMY  AGE 75    There were no vitals filed for this visit.   Subjective Assessment - 02/21/21 2234    Subjective Pt sates continues soreness in L thoracic/shoulder region. She is more concerned about other symptoms that she has been experiencing recently. She states pain on underside of L upper arm and under axilla. She also states tingling in mid/thoracic spine. Also tingling sensation in abdomen, with weakness feeling in legs yesterday. States some dizziness after doing a couple of the neck stretches at home.    Currently in Pain? Yes    Pain Score 4     Pain Location Shoulder    Pain Orientation Left;Posterior    Pain Descriptors / Indicators Aching    Pain Type Acute pain    Pain Onset More than a month ago    Pain Frequency Intermittent  [facility] Adult PT Treatment/Exercise - 02/21/21 2240      Shoulder Exercises: Supine   Flexion 15 reps;AAROM    Flexion Limitations cane/ reviewed for HEP      Shoulder Exercises: Standing   Retraction 15 reps      Shoulder Exercises: Stretch   Corner Stretch 3 reps;30 seconds    Other Shoulder Stretches UT stretch 30 sec x 3 bil;      Manual Therapy   Manual Therapy Joint mobilization;Soft tissue mobilization;Passive ROM    Joint Mobilization t-spine PA mobs,    Soft tissue mobilization STM/TPR to L UT, levator, rhomboid,    Passive ROM for L shoulder , all motions. manual pec stretch;                  PT Education - 02/21/21 2235    Education Details Discussed HEP modifications. Discussed symptoms and recommendation for return to MD    Person(s) Educated Patient    Methods Explanation;Demonstration;Verbal  cues;Handout    Comprehension Verbal cues required;Returned demonstration;Verbalized understanding            PT Short Term Goals - 02/21/21 2205      PT SHORT TERM GOAL #1   Title Pt to be independent with initial HEP    Time 2    Period Weeks    Status New    Target Date 02/22/21             PT Long Term Goals - 02/21/21 2206      PT LONG TERM GOAL #1   Title Pt to be independent with final HEP    Time 6    Period Weeks    Status New    Target Date 03/22/21      PT LONG TERM GOAL #2   Title Pt to report decreased pain in L shoulder/blade region to 0-2/10 with UE activitiy    Time 6    Period Weeks    Status New    Target Date 03/22/21      PT LONG TERM GOAL #3   Title Pt to demo ability for exercise and strengthening with UEs, without pain in L shoulder/blade region.    Time 6    Period Weeks    Status New    Target Date 03/22/21                 Plan - 02/21/21 2237    Clinical Impression Statement Pt with pain and tightness in L UT, into L rhomboid and medial scap border . Did not feel significant relief from dry needling last session. Reviewed HEP today, updated, discussed not doing exercises that made her feel dizzy. Discussed other symptoms that pt has going on that are not related to her shoulder pain. Concern for the reports of tingling in t-spine as well as into LEs, and weakness in LEs. She also reports pain on underside of L arm and axilla. Recommended return to PCP for recomendations for referral to further assesss symptoms. Pt in agreement with paln, and would like to look further into other sympotms at this time. Will hold PT for now, pt may require further care for shoulder/t-spine pain in future.    Personal Factors and Comorbidities Comorbidity 1    Comorbidities OP, Scoliosis,    Examination-Activity Limitations Reach Overhead;Carry;Lift;Dressing    Examination-Participation Restrictions Meal Prep;Cleaning;Community  Activity;Shop;Driving;Laundry    Stability/Clinical Decision Making Stable/Uncomplicated    Rehab Potential Good    PT Frequency 2x /  week    PT Duration 6 weeks    PT Treatment/Interventions ADLs/Self Care Home Management;Cryotherapy;Electrical Stimulation;Iontophoresis 4mg /ml Dexamethasone;Moist Heat;Traction;Ultrasound;DME Instruction;Neuromuscular re-education;Stair training;Functional mobility training;Therapeutic activities;Therapeutic exercise;Manual techniques;Patient/family education;Passive range of motion;Dry needling;Taping;Spinal Manipulations;Joint Manipulations    Consulted and Agree with Plan of Care Patient           Patient will benefit from skilled therapeutic intervention in order to improve the following deficits and impairments:  Increased muscle spasms,Decreased activity tolerance,Pain,Impaired flexibility,Improper body mechanics,Decreased mobility,Decreased strength  Visit Diagnosis: Pain in thoracic spine  Acute pain of left shoulder     Problem List Patient Active Problem List   Diagnosis Date Noted  . Hyperlipidemia- february 2020 coronary Calcium score of 0 12/12/2018  . Vitamin D insufficiency 08/13/2018  . Elevated glycosylated hemoglobin 08/13/2018  . Rib pain on left side 03/21/2017  . Scoliosis 03/21/2017  . Hearing loss associated with syndrome of both ears 12/05/2016  . Neuropathy 03/06/2012  . Osteoporosis 06/29/2009  . 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 COLONIC POLYPS 05/25/2006  . GERD 05/01/1999   Lyndee Hensen, PT, DPT 10:41 PM  02/21/21    [facility] [place], [place], [address] Phone: [phone]   Fax:  [phone]  Name: Dana Chambers MRN: [id number] Date of Birth: December 06, 1945

## 2021-02-21 NOTE — Therapy (Signed)
[facility] [address] [place], [place], [address] Phone: [phone]   Fax:  [phone]  Physical Therapy Treatment  Patient Details  Name: Dana Chambers MRN: [id number] Date of Birth: [birth year] Referring Provider (PT): Teresa Coombs   Encounter Date: 02/10/2021   PT End of Session - 02/21/21 2229    Visit Number 2    Number of Visits 12    Date for PT Re-Evaluation 03/22/21    Authorization Type [place]    PT Start Time 1346    PT Stop Time 1438    PT Time Calculation (min) 52 min    Activity Tolerance Patient tolerated treatment well    Behavior During Therapy [facility] for tasks assessed/performed           Past Medical History:  Diagnosis Date  . Basal cell carcinoma of skin    left ankle, right thigh  . Bursitis    right hip  . Cancer [facility]) 2006   Breast/right  . COVID 01/27/2021  . Diverticulosis   . GERD (gastroesophageal reflux disease)   . History of breast cancer/33 radiation treatments/ no chemo    2006  S/P RIGHT LUMPECTOMY AND RADIATION FOLLOWED BY DR RUBIN--  NO RECURRENCE  . History of kidney stones   . Hyperplastic colon polyp   . IBS (irritable bowel syndrome)   . Idiopathic peripheral neuropathy    bilateral tingling/ due to fall in [place]  . Internal hemorrhoid   . Kidney stone   . Leiomyoma    Multiple small  . Liver cyst 2006   Benign  . OA (osteoarthritis)   . Osteoporosis 2019   T score -2.5  . Personal history of radiation therapy   . Raynaud disease   . Renal calculus, left    non obstructive  . Rib pain on left side    able to lay on left side  . Rosacea   . Tinnitus     Past Surgical History:  Procedure Laterality Date  . BREAST BIOPSY Left 2016   benign  . BREAST EXCISIONAL BIOPSY Right 1973   benign  . BREAST LUMPECTOMY  2006    Right -- S/P RADIATION--  NO RECURRENCE  . DILATION AND CURETTAGE OF UTERUS    . EXTRACORPOREAL SHOCK WAVE LITHOTRIPSY     X2  .  HYSTEROSCOPY    . HYSTEROSCOPY WITH D & C  08/03/2012   Procedure: DILATATION AND CURETTAGE /HYSTEROSCOPY;  Surgeon: Anastasio Auerbach, MD;  Location: [facility];  Service: Gynecology;  Laterality: N/A;  . SUPRANUMERARY NIPPLE EXCISION  1973   CYST  . TONSILLECTOMY  AGE 1    There were no vitals filed for this visit.   Subjective Assessment - 02/21/21 2228    Subjective Pt states continued soreness in same spot, in L thoracic region. Tried HEP.    Currently in Pain? Yes    Pain Score 4     Pain Location Shoulder    Pain Orientation Left    Pain Descriptors / Indicators Aching    Pain Type Acute pain    Pain Onset More than a month ago    Pain Frequency Intermittent                             [facility] Adult PT Treatment/Exercise - 02/21/21 2225      Exercises   Exercises Shoulder      Shoulder  Exercises: Supine   Flexion 15 reps;AAROM    Flexion Limitations cane      Shoulder Exercises: Standing   Row 20 reps    Theraband Level (Shoulder Row) Level 2 (Red)      Shoulder Exercises: Stretch   Corner Stretch 3 reps;30 seconds    Other Shoulder Stretches UT and levator stretches 30 sec x 3 ea bil;    Other Shoulder Stretches posterior shoulder stretch 30 sec x 2 bil;      Modalities   Modalities Moist Heat      Moist Heat Therapy   Number Minutes Moist Heat 10 Minutes    Moist Heat Location Shoulder      Manual Therapy   Manual therapy comments skilled palpation and monitoring of soft tissue with dry needling.    Joint Mobilization t-spine PA mobs,    Soft tissue mobilization STM/ TPR to L rhomboid, levator, UT,            Trigger Point Dry Needling - 02/21/21 0001    Consent Given? Yes    Education Handout Provided Yes    Muscles Treated Head and Neck Levator scapulae    Muscles Treated Upper Quadrant Rhomboids;Infraspinatus;Teres major;Teres minor    Levator Scapulae Response Palpable increased muscle length    Rhomboids  Response Palpable increased muscle length    Infraspinatus Response Twitch response elicited;Palpable increased muscle length   L   Teres major Response Palpable increased muscle length    Teres minor Response Palpable increased muscle length                  PT Short Term Goals - 02/21/21 2205      PT SHORT TERM GOAL #1   Title Pt to be independent with initial HEP    Time 2    Period Weeks    Status New    Target Date 02/22/21             PT Long Term Goals - 02/21/21 2206      PT LONG TERM GOAL #1   Title Pt to be independent with final HEP    Time 6    Period Weeks    Status New    Target Date 03/22/21      PT LONG TERM GOAL #2   Title Pt to report decreased pain in L shoulder/blade region to 0-2/10 with UE activitiy    Time 6    Period Weeks    Status New    Target Date 03/22/21      PT LONG TERM GOAL #3   Title Pt to demo ability for exercise and strengthening with UEs, without pain in L shoulder/blade region.    Time 6    Period Weeks    Status New    Target Date 03/22/21                 Plan - 02/21/21 2231    Clinical Impression Statement Pt with soreness in thoracic musculature on L, Addressed with manual, DTM, TPR, and dry needling today. Pt with good tolerance. Reviewed HEP, as well as posture. Plan to progress as tolerated. Pt reports soreness at underside of L humerus at times, discussed likely not stemming from t-spine.    Personal Factors and Comorbidities Comorbidity 1    Comorbidities OP, Scoliosis,    Examination-Activity Limitations Reach Overhead;Carry;Lift;Dressing    Examination-Participation Restrictions Meal Prep;Cleaning;Community Activity;Shop;Driving;Laundry    Stability/Clinical Decision Making Stable/Uncomplicated    Rehab  Potential Good    PT Frequency 2x / week    PT Duration 6 weeks    PT Treatment/Interventions ADLs/Self Care Home Management;Cryotherapy;Electrical Stimulation;Iontophoresis 4mg /ml  Dexamethasone;Moist Heat;Traction;Ultrasound;DME Instruction;Neuromuscular re-education;Stair training;Functional mobility training;Therapeutic activities;Therapeutic exercise;Manual techniques;Patient/family education;Passive range of motion;Dry needling;Taping;Spinal Manipulations;Joint Manipulations    Consulted and Agree with Plan of Care Patient           Patient will benefit from skilled therapeutic intervention in order to improve the following deficits and impairments:  Increased muscle spasms,Decreased activity tolerance,Pain,Impaired flexibility,Improper body mechanics,Decreased mobility,Decreased strength  Visit Diagnosis: Acute pain of left shoulder  Cervicalgia     Problem List Patient Active Problem List   Diagnosis Date Noted  . Hyperlipidemia- february 2020 coronary Calcium score of 0 12/12/2018  . Vitamin D insufficiency 08/13/2018  . Elevated glycosylated hemoglobin 08/13/2018  . Rib pain on left side 03/21/2017  . Scoliosis 03/21/2017  . Hearing loss associated with syndrome of both ears 12/05/2016  . Neuropathy 03/06/2012  . Osteoporosis 06/29/2009  . 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 COLONIC POLYPS 05/25/2006  . GERD 05/01/1999    Lyndee Hensen, PT, DPT 10:34 PM  02/21/21    [facility] [place], [place], [address] Phone: [phone]   Fax:  [phone]  Name: Dana Chambers MRN: [id number] Date of Birth: May 25, 1946

## 2021-02-21 NOTE — Addendum Note (Signed)
Addended by: Lyndee Hensen on: 02/21/2021 10:25 PM   Modules accepted: Orders

## 2021-02-25 NOTE — Patient Instructions (Addendum)
EKG today under hyperlipidemia  Glad blood pressure has come back down- keep [place] updated if persistently above 135/85.   I would follow up with Dr. Paulla Fore  Recommended follow up: keep January visit for next year unless you need me sooner. Could also pick something after July for mid point check in and check a1c

## 2021-02-25 NOTE — Progress Notes (Signed)
Phone [phone] In person visit   Subjective:   Dana Chambers is a 75 y.o. year old very pleasant female patient who presents for/with See problem oriented charting Chief Complaint  Patient presents with  . Hypertension   This visit occurred during the SARS-CoV-2 public health emergency.  Safety protocols were in place, including screening questions prior to the visit, additional usage of staff PPE, and extensive cleaning of exam room while observing appropriate contact time as indicated for disinfecting solutions.   Past Medical History-  Patient Active Problem List   Diagnosis Date Noted  . Hyperlipidemia- february 2020 coronary Calcium score of 0 12/12/2018    Priority: Medium  . Rib pain on left side 03/21/2017    Priority: Medium  . Scoliosis 03/21/2017    Priority: Medium  . Osteoporosis 06/29/2009    Priority: Medium  . 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    Priority: Medium  . GERD 05/01/1999    Priority: Medium  . Hearing loss associated with syndrome of both ears 12/05/2016    Priority: Low  . Neuropathy 03/06/2012    Priority: Low  . COLONIC POLYPS 05/25/2006    Priority: Low  . Vitamin D insufficiency 08/13/2018  . Elevated glycosylated hemoglobin 08/13/2018    Medications- reviewed and updated Current Outpatient Medications  Medication Sig Dispense Refill  . acetaminophen (TYLENOL) 500 MG tablet Take 1,000 mg by mouth 3 (three) times daily as needed.    . Cholecalciferol (VITAMIN D PO) Take by mouth.    . Cyanocobalamin (VITAMIN B 12 PO) Take by mouth.    . denosumab (PROLIA) 60 MG/ML SOSY injection Inject 60 mg into the skin every 6 (six) months.    . esomeprazole (NEXIUM) 20 MG capsule TAKE ONE CAPSULE BY MOUTH EVERY DAY AT NOON 90 capsule 3   No current facility-administered medications for this visit.     Objective:  BP 138/70   Pulse 84   Temp 98.7 F (37.1 C) (Temporal)   Ht 5\' 6"  (1.676 m)   Wt 141 lb 9.6 oz (64.2 kg)    LMP  (LMP Unknown)   SpO2 90%   BMI 22.85 kg/m  Gen: NAD, resting comfortably Not able to reproduce pain with palpation or tingling with palpation of neck-no midline pain CV: RRR no murmurs rubs or gallops Lungs: CTAB no crackles, wheeze, rhonchi Ext: no edema Skin: warm, dry   EKG: sinus rhythm with rate 70, normal axis, normal intervals, no hypertrophy, no st or t wave changes     Assessment and Plan   #elevated BP reading S: medication: none. Home readings up into 150s/90s as below when she was not feeling well Home readings #s: 131/80 this morning before coffee. Home BP average over last 10 readings was 133/83 BP Readings from Last 3 Encounters:  02/26/21 138/70  12/21/20 136/86  09/21/20 120/78  A/P: I wonder if patient had an acute illness which also caused blood pressure elevation-we average her last 10 readings and less than 135/85 when even including the 150/99 reading.  She will continue to monitor and let us know if consistently above 135/85 -Patient with known hyperlipidemia and has been 7 years since last EKG-she would like to have this updated today-I think is reasonable request-it would also give her some peace of mind about the left arm tingling issue.  We discussed this does not rule out cardiac disease and I still want her to follow-up if she has new or worsening symptoms or  new symptoms such as chest pain, shortness of breath, exertional component to any symptoms.  # Neck pain/ left arm tingling S:patient saw Dr. Paulla Fore with sports medicine in February- had cervical and thoracic spine films. He was concerned about T2 radiculitis with functional imbalance. History of compression fracture luckily none on films. Saw Lyndee Hensen, DPT but did not tolerate some of the maneuvers. Also wonders if PT could have caused some of below symptoms- started a few days after PT- On the 22nd had several readings 150s over 90s. Did have coffee before checking meds. Had some nausea and  tingling in spine (has occurred in the past) and some chills more in the back. Had dry needling on 16th. Has upcoming appointment with Dr. Paulla Fore on April 20th. Still getting some numbness under left arm into axilla- doing slightly better. Still having left upper back pain- some days worse than others. Some popping of left shoulder at times.  This is not an exertional pain per her report  She had covid in early march and took paxlovid 2nd through 7th- weird metallic taste with the medicine. Did have diarrhea with it.   Some increased joint pain with cold recently- hips, knees. Missed a water aerobics class as a result but did make it Tuesday/thursday. Potential neuropathy- have discussed in fast.  A/P: Patient is undergoing appropriate work-up with Dr. Laqueta Due cervical and thoracic spine films were reassuring-I encouraged her to keep scheduled follow-up with Dr. Paulla Fore.  I think is fine to hold off on physical therapy since this made her feel worse unless he recommends restarting.  We discussed may need nerve conduction studies or MRI potentially but would defer to Dr. Nicolasa Ducking expertise -Some counseling provided today as blood pressure changes and neck/tingling issues have been bothersome for her  #Health maintenance - she will plan on getting prevnar 20 - a1c went up on crestor 20 mg.  6.0 on last check-she may schedule a midyear visit to recheck A1c  Recommended follow up: Keep January visit but also discussed potential midyear repeat A1c/office visit Future Appointments  Date Time Provider [place]  08/23/2021  9:00 AM Philemon Kingdom, MD [facility] None  09/22/2021  9:00 AM Joseph Pierini, MD [facility] None  12/27/2021  8:40 AM Marin Olp, MD [facility] PEC  02/07/2022  8:45 AM [facility] HEALTH COACH [facility]    Lab/Order associations:   ICD-10-CM   1. Hyperlipidemia associated with type 2 diabetes mellitus ([place])  E11.69    E78.5   2. Rib pain on left side  R07.81   3.  Hyperlipidemia, unspecified hyperlipidemia type  E78.5 EKG 12-Lead    No orders of the defined types were placed in this encounter.   Time Spent: 35 minutes of total time (9:21 AM- 9:56 AM- 2 minutes outside of this timeframe used on ekg interpretation) was spent on the date of the encounter performing the following actions: chart review prior to seeing the patient, obtaining history, performing a medically necessary exam, counseling on the treatment plan, placing orders, and documenting in our EHR.   Return precautions advised.  Garret Reddish, MD

## 2021-02-26 ENCOUNTER — Encounter: Payer: Self-pay | Admitting: Family Medicine

## 2021-02-26 ENCOUNTER — Other Ambulatory Visit: Payer: Self-pay

## 2021-02-26 ENCOUNTER — Ambulatory Visit: Payer: Medicare PPO | Admitting: Family Medicine

## 2021-02-26 VITALS — BP 138/70 | HR 84 | Temp 98.7°F | Ht 66.0 in | Wt 141.6 lb

## 2021-02-26 DIAGNOSIS — M542 Cervicalgia: Secondary | ICD-10-CM | POA: Diagnosis not present

## 2021-02-26 DIAGNOSIS — R200 Anesthesia of skin: Secondary | ICD-10-CM | POA: Diagnosis not present

## 2021-02-26 DIAGNOSIS — E785 Hyperlipidemia, unspecified: Secondary | ICD-10-CM | POA: Diagnosis not present

## 2021-02-26 DIAGNOSIS — R202 Paresthesia of skin: Secondary | ICD-10-CM | POA: Diagnosis not present

## 2021-02-26 DIAGNOSIS — E1169 Type 2 diabetes mellitus with other specified complication: Secondary | ICD-10-CM | POA: Diagnosis not present

## 2021-03-08 ENCOUNTER — Other Ambulatory Visit: Payer: Self-pay | Admitting: Family Medicine

## 2021-03-08 DIAGNOSIS — R921 Mammographic calcification found on diagnostic imaging of breast: Secondary | ICD-10-CM

## 2021-03-17 DIAGNOSIS — M9907 Segmental and somatic dysfunction of upper extremity: Secondary | ICD-10-CM | POA: Diagnosis not present

## 2021-03-17 DIAGNOSIS — M4003 Postural kyphosis, cervicothoracic region: Secondary | ICD-10-CM | POA: Diagnosis not present

## 2021-03-17 DIAGNOSIS — M25512 Pain in left shoulder: Secondary | ICD-10-CM | POA: Diagnosis not present

## 2021-03-17 DIAGNOSIS — M9901 Segmental and somatic dysfunction of cervical region: Secondary | ICD-10-CM | POA: Diagnosis not present

## 2021-03-17 DIAGNOSIS — M542 Cervicalgia: Secondary | ICD-10-CM | POA: Diagnosis not present

## 2021-03-17 DIAGNOSIS — M5451 Vertebrogenic low back pain: Secondary | ICD-10-CM | POA: Diagnosis not present

## 2021-03-17 DIAGNOSIS — M810 Age-related osteoporosis without current pathological fracture: Secondary | ICD-10-CM | POA: Diagnosis not present

## 2021-03-17 DIAGNOSIS — M9908 Segmental and somatic dysfunction of rib cage: Secondary | ICD-10-CM | POA: Diagnosis not present

## 2021-03-17 DIAGNOSIS — M9902 Segmental and somatic dysfunction of thoracic region: Secondary | ICD-10-CM | POA: Diagnosis not present

## 2021-03-29 ENCOUNTER — Other Ambulatory Visit: Payer: Self-pay | Admitting: Sports Medicine

## 2021-03-29 DIAGNOSIS — M25512 Pain in left shoulder: Secondary | ICD-10-CM

## 2021-03-29 DIAGNOSIS — M810 Age-related osteoporosis without current pathological fracture: Secondary | ICD-10-CM

## 2021-03-29 DIAGNOSIS — M542 Cervicalgia: Secondary | ICD-10-CM

## 2021-03-29 DIAGNOSIS — M4003 Postural kyphosis, cervicothoracic region: Secondary | ICD-10-CM

## 2021-04-11 ENCOUNTER — Ambulatory Visit
Admission: RE | Admit: 2021-04-11 | Discharge: 2021-04-11 | Disposition: A | Discharge status: Home / Self Care | Payer: Medicare PPO | Source: Ambulatory Visit | Attending: Sports Medicine | Admitting: Sports Medicine

## 2021-04-11 ENCOUNTER — Other Ambulatory Visit: Payer: Self-pay

## 2021-04-11 DIAGNOSIS — M4003 Postural kyphosis, cervicothoracic region: Secondary | ICD-10-CM

## 2021-04-11 DIAGNOSIS — M810 Age-related osteoporosis without current pathological fracture: Secondary | ICD-10-CM

## 2021-04-11 DIAGNOSIS — M25512 Pain in left shoulder: Secondary | ICD-10-CM

## 2021-04-11 DIAGNOSIS — M542 Cervicalgia: Secondary | ICD-10-CM

## 2021-04-11 DIAGNOSIS — M4802 Spinal stenosis, cervical region: Secondary | ICD-10-CM | POA: Diagnosis not present

## 2021-04-12 ENCOUNTER — Encounter: Payer: Self-pay | Admitting: Family Medicine

## 2021-04-13 DIAGNOSIS — M546 Pain in thoracic spine: Secondary | ICD-10-CM | POA: Diagnosis not present

## 2021-04-13 DIAGNOSIS — M810 Age-related osteoporosis without current pathological fracture: Secondary | ICD-10-CM | POA: Diagnosis not present

## 2021-04-13 DIAGNOSIS — M542 Cervicalgia: Secondary | ICD-10-CM | POA: Diagnosis not present

## 2021-04-13 DIAGNOSIS — M549 Dorsalgia, unspecified: Secondary | ICD-10-CM | POA: Diagnosis not present

## 2021-04-13 DIAGNOSIS — M25512 Pain in left shoulder: Secondary | ICD-10-CM | POA: Diagnosis not present

## 2021-04-13 DIAGNOSIS — M5451 Vertebrogenic low back pain: Secondary | ICD-10-CM | POA: Diagnosis not present

## 2021-04-14 ENCOUNTER — Other Ambulatory Visit (HOSPITAL_BASED_OUTPATIENT_CLINIC_OR_DEPARTMENT_OTHER): Payer: Self-pay | Admitting: Sports Medicine

## 2021-04-14 ENCOUNTER — Telehealth: Payer: Self-pay | Admitting: Family Medicine

## 2021-04-14 DIAGNOSIS — R9389 Abnormal findings on diagnostic imaging of other specified body structures: Secondary | ICD-10-CM

## 2021-04-14 DIAGNOSIS — E041 Nontoxic single thyroid nodule: Secondary | ICD-10-CM

## 2021-04-14 NOTE — Telephone Encounter (Signed)
Called pt and LVM to set up lab appt 

## 2021-04-14 NOTE — Telephone Encounter (Signed)
Alerted by Dr. Nicolasa Ducking office about the following on MRI "15 mm right thyroid nodule, hypointense on T2. Additional 12 mm nodule lateral to the right thyroid gland. Recommend thyroid Ultrasound"  May have ordered follow-up ultrasound.  We will check TSH-I ordered this-team please find a time for her to come by for labs and looks forward a copy to Dr. Paulla Fore once completed

## 2021-04-16 ENCOUNTER — Other Ambulatory Visit: Payer: Medicare PPO

## 2021-04-16 ENCOUNTER — Other Ambulatory Visit: Payer: Self-pay

## 2021-04-16 DIAGNOSIS — E041 Nontoxic single thyroid nodule: Secondary | ICD-10-CM

## 2021-04-16 LAB — TSH: TSH: 1.35 u[IU]/mL (ref 0.35–4.50)

## 2021-04-19 ENCOUNTER — Ambulatory Visit (HOSPITAL_BASED_OUTPATIENT_CLINIC_OR_DEPARTMENT_OTHER): Payer: Medicare PPO

## 2021-04-20 ENCOUNTER — Other Ambulatory Visit: Payer: Self-pay

## 2021-04-20 ENCOUNTER — Ambulatory Visit
Admission: RE | Admit: 2021-04-20 | Discharge: 2021-04-20 | Disposition: A | Discharge status: Home / Self Care | Payer: Medicare PPO | Source: Ambulatory Visit | Attending: Family Medicine | Admitting: Family Medicine

## 2021-04-20 DIAGNOSIS — R921 Mammographic calcification found on diagnostic imaging of breast: Secondary | ICD-10-CM

## 2021-04-21 ENCOUNTER — Ambulatory Visit (HOSPITAL_BASED_OUTPATIENT_CLINIC_OR_DEPARTMENT_OTHER)
Admission: RE | Admit: 2021-04-21 | Discharge: 2021-04-21 | Disposition: A | Discharge status: Home / Self Care | Payer: Medicare PPO | Source: Ambulatory Visit | Attending: Sports Medicine | Admitting: Sports Medicine

## 2021-04-21 DIAGNOSIS — R9389 Abnormal findings on diagnostic imaging of other specified body structures: Secondary | ICD-10-CM | POA: Diagnosis not present

## 2021-04-21 DIAGNOSIS — E041 Nontoxic single thyroid nodule: Secondary | ICD-10-CM | POA: Diagnosis not present

## 2021-05-04 DIAGNOSIS — M542 Cervicalgia: Secondary | ICD-10-CM | POA: Diagnosis not present

## 2021-05-04 DIAGNOSIS — M4003 Postural kyphosis, cervicothoracic region: Secondary | ICD-10-CM | POA: Diagnosis not present

## 2021-05-04 DIAGNOSIS — M25512 Pain in left shoulder: Secondary | ICD-10-CM | POA: Diagnosis not present

## 2021-05-04 DIAGNOSIS — M810 Age-related osteoporosis without current pathological fracture: Secondary | ICD-10-CM | POA: Diagnosis not present

## 2021-05-04 DIAGNOSIS — M546 Pain in thoracic spine: Secondary | ICD-10-CM | POA: Diagnosis not present

## 2021-06-09 ENCOUNTER — Other Ambulatory Visit: Payer: Self-pay

## 2021-06-09 ENCOUNTER — Ambulatory Visit: Payer: Medicare PPO | Admitting: Physical Medicine and Rehabilitation

## 2021-06-09 ENCOUNTER — Encounter: Payer: Self-pay | Admitting: Physical Medicine and Rehabilitation

## 2021-06-09 ENCOUNTER — Telehealth: Payer: Self-pay | Admitting: Physical Medicine and Rehabilitation

## 2021-06-09 VITALS — BP 138/89 | HR 75

## 2021-06-09 DIAGNOSIS — M25512 Pain in left shoulder: Secondary | ICD-10-CM

## 2021-06-09 DIAGNOSIS — G8929 Other chronic pain: Secondary | ICD-10-CM

## 2021-06-09 DIAGNOSIS — M4802 Spinal stenosis, cervical region: Secondary | ICD-10-CM | POA: Diagnosis not present

## 2021-06-09 DIAGNOSIS — M5412 Radiculopathy, cervical region: Secondary | ICD-10-CM

## 2021-06-09 DIAGNOSIS — M7918 Myalgia, other site: Secondary | ICD-10-CM

## 2021-06-09 DIAGNOSIS — R202 Paresthesia of skin: Secondary | ICD-10-CM | POA: Diagnosis not present

## 2021-06-09 NOTE — Progress Notes (Signed)
Dana Chambers - 75 y.o. female MRN [id number]  Date of birth: 23-Jan-1946  Office Visit Note: Visit Date: 06/09/2021 PCP: Marin Olp, MD Referred by: Marin Olp, MD  Subjective: Chief Complaint  Patient presents with   Left Shoulder - Pain   HPI: Dana Chambers is a 75 y.o. female who comes in today At the request of Dr. Teresa Coombs for evaluation and management of chronic worsening severe and recalcitrant neck pain with left shoulder and scapular pain with some paresthesia type numbness in the axilla and down the medial aspect of the left arm.  She rates her pain as a 5 out of 10 which is a dull tingling and aching pain but it can be worse with movement.  She reports a generalized tightness around the neck and scapular area.  She has had osteopathic manipulation as well as medication management and physical therapy.  She did try dry needling but felt like this somewhat made it worse.  She had 3 sessions of physical therapy but was not doing well with that.  She reports that it limits her daily activities to a small degree but it is something that causes her some issues at night and with movement and just very uncomfortable.  She denies any paresthesias into the hands.  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  She feels like that came from a fall that she had in [place].  She also reports a history of osteoporosis as well as Raynaud's syndrome but no history of fibromyalgia.  With failure of conservative care and having this for many months now she has had MRI of the cervical spine performed and this is reviewed with her today using spine models and imaging.  This is pretty significant for C6-7 narrowing.  No cord compression but some flattening.  She has some right-sided arthritis more than the left above that level.  She has had no prior cervical spine surgery or interventional spine treatment.  She has had a history of breast cancer with radiation  treatment but this was on the right side.  She has not had electrodiagnostic study.  Review of Systems  Musculoskeletal:  Positive for back pain, joint pain and neck pain.  Neurological:  Positive for tingling.  All other systems reviewed and are negative. Otherwise per HPI.  Assessment & Plan: Visit Diagnoses:    ICD-10-CM   1. Spinal stenosis of cervical region  M48.02     2. Cervical radiculopathy  M54.12     3. Chronic left shoulder pain  M25.512    G89.29     4. Paresthesia of skin  R20.2     5. Myofascial pain syndrome  M79.18        Plan: Findings:  Chronic and worsening and severe left neck and trapezial pain and shoulder blade pain with paresthesias in the axilla and in the medial arm.  This does appear clinically to be more of a radicular type pain more classically potentially a T1 or C8 type nerve pattern.  She does have C6-7 narrowing of the canal that seems to be worse on the pictures that even the MRI report would lead to believe.  There is no cord compression but flattening.  No severe foraminal narrowing but there is some mild narrowing above that level with arthritis.  She clearly has myofascial pain syndrome with active trigger points to do reproduce some of her scapular pain but not the referral pain pattern.  I had a long discussion with her today about treatment considerations.  Given that she has failed all other conservative care I would recommend epidural injection diagnostically and therapeutically.  This would be a C7-T1 interlaminar injection using fluoroscopic guidance.  She is not on any anticoagulation.  Consideration would be given to trigger point injection as well even though she did not do very well with dry needling sometimes it is operator dependent.  She should continue with manual treatment for that over time.  We did discuss surgical options for her cervical stenosis and at this point with the fairly normal nerve findings on exam I do not think she needs  to consider that at this point but it is something to think about with the narrowing at that level.  Secondarily I think she probably has some level of potential for carpal tunnel syndrome bilaterally.  Its fairly mild based on physical exam but I do think that something for [place] to think about in terms of symptoms into the hands at all.   Meds & Orders: No orders of the defined types were placed in this encounter.  No orders of the defined types were placed in this encounter.   Follow-up: Return for Left C7-T1 interlaminar epidural steroid injection..   Procedures: No procedures performed      Clinical History: MRI CERVICAL SPINE WITHOUT CONTRAST   TECHNIQUE: Multiplanar, multisequence MR imaging of the cervical spine was performed. No intravenous contrast was administered.   COMPARISON:  Cervical spine radiographs 12/29/2020   FINDINGS: Alignment: 3 mm anterolisthesis C4-5.   Vertebrae: Negative for fracture or mass.   Cord: Normal signal and morphology   Posterior Fossa, vertebral arteries, paraspinal tissues: 15 mm right thyroid nodule which is hypointense on T2. Additional 12 mm nodule lateral to the right thyroid.   Disc levels:   C2-3: Negative   C3-4: Mild disc degeneration and mild facet degeneration. Mild foraminal narrowing bilaterally   C4-5: 3 mm anterolisthesis. Asymmetric severe facet degeneration on the right. Moderate right foraminal narrowing. Left foramen patent   C5-6: Moderate disc degeneration with disc space narrowing and diffuse uncinate spurring. Moderate foraminal narrowing bilaterally. Bilateral facet degeneration.   C6-7: Disc degeneration with diffuse uncinate spurring. Cord flattening with mild spinal stenosis. Moderate foraminal narrowing bilaterally   C7-T1: Moderate left foraminal narrowing due to spurring.   IMPRESSION: Cervical spondylosis. Multilevel foraminal encroachment due to spurring   15 mm right thyroid nodule,  hypointense on T2. Additional 12 mm nodule lateral to the right thyroid gland. Recommend thyroid ultrasound (ref: J Am Coll Radiol. 2015 Feb;12(2): 143-50).     Electronically Signed   By: Franchot Gallo M.D.   On: 04/12/2021 15:22   She reports that she has never smoked. She has never used smokeless tobacco.  Recent Labs    08/21/20 0934 12/21/20 1142  HGBA1C 6.2 6.0    Objective:  VS:  HT:    WT:   BMI:     BP:138/89  HR:75bpm  TEMP: ( )  RESP:  Physical Exam Vitals and nursing note reviewed.  Constitutional:      General: She is not in acute distress.    Appearance: Normal appearance. She is not ill-appearing.  HENT:     Head: Normocephalic and atraumatic.     Right Ear: External ear normal.     Left Ear: External ear normal.  Eyes:     Extraocular Movements: Extraocular movements intact.  Cardiovascular:     Rate and Rhythm:  Normal rate.     Pulses: Normal pulses.  Musculoskeletal:     Cervical back: Tenderness present. No rigidity.     Right lower leg: No edema.     Left lower leg: No edema.     Comments: Patient has good strength in the upper extremities including 5 out of 5 strength in shoulder abduction, elbow flexion extension and wrist extension long finger flexion and APB.  There is no atrophy of the hands intrinsically.  There is a negative Hoffmann's test.  Patient has a negative Phalen's test bilaterally.  She has intact sensation in the upper extremities bilaterally.  She has an equivocally positive Spurling's test on the left.  She has active trigger points in the levator scapula trapezius and rhomboid on the left more than right.   Lymphadenopathy:     Cervical: No cervical adenopathy.  Skin:    Findings: No erythema, lesion or rash.  Neurological:     General: No focal deficit present.     Mental Status: She is alert and oriented to person, place, and time.     Sensory: No sensory deficit.     Motor: No weakness or abnormal muscle tone.      Coordination: Coordination normal.     Gait: Gait normal.  Psychiatric:        Mood and Affect: Mood normal.        Behavior: Behavior normal.    Ortho Exam  Imaging: No results found.  Past Medical/Family/Surgical/Social History: Medications & Allergies reviewed per EMR, new medications updated. Patient Active Problem List   Diagnosis Date Noted   Hyperlipidemia- february 2020 coronary Calcium score of 0 12/12/2018   Vitamin D insufficiency 08/13/2018   Elevated glycosylated hemoglobin 08/13/2018   Rib pain on left side 03/21/2017   Scoliosis 03/21/2017   Hearing loss associated with syndrome of both ears 12/05/2016   Neuropathy 03/06/2012   Osteoporosis 06/29/2009   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   COLONIC POLYPS 05/25/2006   GERD 05/01/1999   Past Medical History:  Diagnosis Date   Basal cell carcinoma of skin    left ankle, right thigh   Bursitis    right hip   Cancer ([place]) 2006   Breast/right   COVID 01/27/2021   Diverticulosis    GERD (gastroesophageal reflux disease)    History of breast cancer/33 radiation treatments/ no chemo    2006  S/P RIGHT LUMPECTOMY AND RADIATION FOLLOWED BY DR RUBIN--  NO RECURRENCE   History of kidney stones    Hyperplastic colon polyp    IBS (irritable bowel syndrome)    Idiopathic peripheral neuropathy    bilateral tingling/ due to fall in [facility]   Internal hemorrhoid    Kidney stone    Leiomyoma    Multiple small   Liver cyst 2006   Benign   OA (osteoarthritis)    Osteoporosis 2019   T score -2.5   Personal history of radiation therapy    Raynaud disease    Renal calculus, left    non obstructive   Rib pain on left side    able to lay on left side   Rosacea    Tinnitus    Family History  Problem Relation Age of Onset   Dementia Mother    Depression Mother    Osteoporosis Mother    Stroke Father    Heart attack Father 47   Rectal cancer Maternal Grandmother    Colon cancer Maternal Grandmother  Stroke Sister        TIA. ? dementia. another stroke after covid then feeding issues led to eath   Dementia Sister    Breast cancer Neg Hx    Past Surgical History:  Procedure Laterality Date   BREAST BIOPSY Left 2016   benign   BREAST EXCISIONAL BIOPSY Right 1973   benign   BREAST LUMPECTOMY  2006    Right -- S/P RADIATION--  NO RECURRENCE   DILATION AND CURETTAGE OF UTERUS     EXTRACORPOREAL SHOCK WAVE LITHOTRIPSY     X2   HYSTEROSCOPY     HYSTEROSCOPY WITH D & C  08/03/2012   Procedure: DILATATION AND CURETTAGE /HYSTEROSCOPY;  Surgeon: Anastasio Auerbach, MD;  Location: [facility];  Service: Gynecology;  Laterality: N/A;   SUPRANUMERARY NIPPLE EXCISION  1973   CYST   TONSILLECTOMY  AGE 39   Social History   Occupational History   Occupation: Firefighter  Tobacco Use   Smoking status: Never   Smokeless tobacco: Never  Vaping Use   Vaping Use: Never used  Substance and Sexual Activity   Alcohol use: Yes    Alcohol/week: 7.0 standard drinks    Types: 7 Standard drinks or equivalent per week   Drug use: No   Sexual activity: Not Currently    Birth control/protection: Post-menopausal    Comment: 1st intercourse 75 yo-Fewer than 5 partners

## 2021-06-09 NOTE — Progress Notes (Signed)
Neck pain- started with left shoulder/ scapula pain. Numbness under left arm with occasional numbness down left arm. Tried PT for 3 sessions but felt it made symptoms slightly worse. Numeric Pain Rating Scale and Functional Assessment Average Pain 5 Pain Right Now 5 My pain is constant, dull, tingling, and aching Pain is worse with: some activites and stiffness in the morning Pain improves with:  Change in activity and rest   In the last MONTH (on 0-10 scale) has pain interfered with the following?  1. General activity like being  able to carry out your everyday physical activities such as walking, climbing stairs, carrying groceries, or moving a chair?  Rating(3)  2. Relation with others like being able to carry out your usual social activities and roles such as  activities at home, at work and in your community. Rating(3)  3. Enjoyment of life such that you have  been bothered by emotional problems such as feeling anxious, depressed or irritable?  Rating(3)

## 2021-06-09 NOTE — Telephone Encounter (Signed)
Is auth needed for left C7-T1 IL? Scheduled for 7/20 with driver and no blood thinners.

## 2021-06-15 ENCOUNTER — Encounter: Payer: Self-pay | Admitting: Physical Medicine and Rehabilitation

## 2021-06-16 ENCOUNTER — Ambulatory Visit: Payer: Self-pay

## 2021-06-16 ENCOUNTER — Other Ambulatory Visit: Payer: Self-pay

## 2021-06-16 ENCOUNTER — Encounter: Payer: Self-pay | Admitting: Physical Medicine and Rehabilitation

## 2021-06-16 ENCOUNTER — Ambulatory Visit: Payer: Medicare PPO | Admitting: Physical Medicine and Rehabilitation

## 2021-06-16 VITALS — BP 116/77 | HR 77

## 2021-06-16 DIAGNOSIS — M5412 Radiculopathy, cervical region: Secondary | ICD-10-CM

## 2021-06-16 MED ORDER — BETAMETHASONE SOD PHOS & ACET 6 (3-3) MG/ML IJ SUSP
12.0000 mg | Freq: Once | INTRAMUSCULAR | Status: AC
  Administered 2021-06-16: 12 mg

## 2021-06-16 NOTE — Progress Notes (Signed)
Pt state neck pain that travels to her left shoulder blade and under left armpit then shoot down her arm. Pt state movement make the pain worse. Pt state she stiffiness and numbness in her arm. Pt state she take over the counter pain meds to help ease her pain.   Numeric Pain Rating Scale and Functional Assessment Average Pain 4   In the last MONTH (on 0-10 scale) has pain interfered with the following?  1. General activity like being  able to carry out your everyday physical activities such as walking, climbing stairs, carrying groceries, or moving a chair?  Rating(6)   +Driver, -BT, -Dye Allergies.

## 2021-06-16 NOTE — Progress Notes (Signed)
Dana Chambers - 75 y.o. female MRN [id number]  Date of birth: 06-Sep-1946  Office Visit Note: Visit Date: 06/16/2021 PCP: Marin Olp, MD Referred by: Marin Olp, MD  Subjective: Chief Complaint  Patient presents with   Neck - Pain   Left Shoulder - Pain, Numbness   Left Arm - Pain, Numbness   HPI:  Dana Chambers is a 75 y.o. female who comes in today for planned Left C7-T1 Cervical Interlaminar epidural steroid injection with fluoroscopic guidance.  The patient has failed conservative care including home exercise, medications, time and activity modification.  This injection will be diagnostic and hopefully therapeutic.  Please see requesting physician notes for further details and justification. MRI reviewed with images and spine model.  MRI reviewed in the note below.    ROS Otherwise per HPI.  Assessment & Plan: Visit Diagnoses:    ICD-10-CM   1. Cervical radiculopathy  M54.12 XR C-ARM NO REPORT    Epidural Steroid injection    betamethasone acetate-betamethasone sodium phosphate (CELESTONE) injection 12 mg      Plan: No additional findings.   Meds & Orders:  Meds ordered this encounter  Medications   betamethasone acetate-betamethasone sodium phosphate (CELESTONE) injection 12 mg    Orders Placed This Encounter  Procedures   XR C-ARM NO REPORT   Epidural Steroid injection    Follow-up: No follow-ups on file.   Procedures: No procedures performed  Cervical Epidural Steroid Injection - Interlaminar Approach with Fluoroscopic Guidance  Patient: Dana Chambers      Date of Birth: 02-10-1946 MRN: [id number] PCP: Marin Olp, MD      Visit Date: 06/16/2021   Universal Protocol:    Date/Time: 07/26/226:19 AM  Consent Given By: the patient  Position: PRONE  Additional Comments: Vital signs were monitored before and after the procedure. Patient was prepped and draped in the usual sterile fashion. The correct patient, procedure, and  site was verified.   Injection Procedure Details:   Procedure diagnoses: Cervical radiculopathy [M54.12]    Meds Administered:  Meds ordered this encounter  Medications   betamethasone acetate-betamethasone sodium phosphate (CELESTONE) injection 12 mg     Laterality: Left  Location/Site: C7-T1  Needle: 3.5 in., 20 ga. Tuohy  Needle Placement: Paramedian epidural space  Findings:  -Comments: Excellent flow of contrast into the epidural space.  Procedure Details: Using a paramedian approach from the side mentioned above, the region overlying the inferior lamina was localized under fluoroscopic visualization and the soft tissues overlying this structure were infiltrated with 4 ml. of 1% Lidocaine without Epinephrine. A # 20 gauge, Tuohy needle was inserted into the epidural space using a paramedian approach.  The epidural space was localized using loss of resistance along with contralateral oblique bi-planar fluoroscopic views.  After negative aspirate for air, blood, and CSF, a 2 ml. volume of Isovue-250 was injected into the epidural space and the flow of contrast was observed. Radiographs were obtained for documentation purposes.   The injectate was administered into the level noted above.  Additional Comments:  The patient tolerated the procedure well Dressing: 2 x 2 sterile gauze and Band-Aid    Post-procedure details: Patient was observed during the procedure. Post-procedure instructions were reviewed.  Patient left the clinic in stable condition.   Clinical History: MRI CERVICAL SPINE WITHOUT CONTRAST   TECHNIQUE: Multiplanar, multisequence MR imaging of the cervical spine was performed. No intravenous contrast was administered.   COMPARISON:  Cervical spine radiographs 12/29/2020  FINDINGS: Alignment: 3 mm anterolisthesis C4-5.   Vertebrae: Negative for fracture or mass.   Cord: Normal signal and morphology   Posterior Fossa, vertebral arteries,  paraspinal tissues: 15 mm right thyroid nodule which is hypointense on T2. Additional 12 mm nodule lateral to the right thyroid.   Disc levels:   C2-3: Negative   C3-4: Mild disc degeneration and mild facet degeneration. Mild foraminal narrowing bilaterally   C4-5: 3 mm anterolisthesis. Asymmetric severe facet degeneration on the right. Moderate right foraminal narrowing. Left foramen patent   C5-6: Moderate disc degeneration with disc space narrowing and diffuse uncinate spurring. Moderate foraminal narrowing bilaterally. Bilateral facet degeneration.   C6-7: Disc degeneration with diffuse uncinate spurring. Cord flattening with mild spinal stenosis. Moderate foraminal narrowing bilaterally   C7-T1: Moderate left foraminal narrowing due to spurring.   IMPRESSION: Cervical spondylosis. Multilevel foraminal encroachment due to spurring   15 mm right thyroid nodule, hypointense on T2. Additional 12 mm nodule lateral to the right thyroid gland. Recommend thyroid ultrasound (ref: J Am Coll Radiol. 2015 Feb;12(2): 143-50).     Electronically Signed   By: Franchot Gallo M.D.   On: 04/12/2021 15:22     Objective:  VS:  HT:    WT:   BMI:     BP:116/77  HR:77bpm  TEMP: ( )  RESP:  Physical Exam Vitals and nursing note reviewed.  Constitutional:      General: She is not in acute distress.    Appearance: Normal appearance. She is not ill-appearing.  HENT:     Head: Normocephalic and atraumatic.     Right Ear: External ear normal.     Left Ear: External ear normal.  Eyes:     Extraocular Movements: Extraocular movements intact.  Cardiovascular:     Rate and Rhythm: Normal rate.     Pulses: Normal pulses.  Musculoskeletal:     Cervical back: Tenderness present. No rigidity.     Right lower leg: No edema.     Left lower leg: No edema.     Comments: Patient has good strength in the upper extremities including 5 out of 5 strength in wrist extension long finger flexion  and APB.  There is no atrophy of the hands intrinsically.  There is a negative Hoffmann's test.   Lymphadenopathy:     Cervical: No cervical adenopathy.  Skin:    Findings: No erythema, lesion or rash.  Neurological:     General: No focal deficit present.     Mental Status: She is alert and oriented to person, place, and time.     Sensory: No sensory deficit.     Motor: No weakness or abnormal muscle tone.     Coordination: Coordination normal.  Psychiatric:        Mood and Affect: Mood normal.        Behavior: Behavior normal.     Imaging: No results found.

## 2021-06-16 NOTE — Patient Instructions (Signed)

## 2021-06-22 NOTE — Procedures (Signed)
Cervical Epidural Steroid Injection - Interlaminar Approach with Fluoroscopic Guidance  Patient: Dana Chambers      Date of Birth: 10/11/1946 MRN: [id number] PCP: Marin Olp, MD      Visit Date: 06/16/2021   Universal Protocol:    Date/Time: 07/26/226:19 AM  Consent Given By: the patient  Position: PRONE  Additional Comments: Vital signs were monitored before and after the procedure. Patient was prepped and draped in the usual sterile fashion. The correct patient, procedure, and site was verified.   Injection Procedure Details:   Procedure diagnoses: Cervical radiculopathy [M54.12]    Meds Administered:  Meds ordered this encounter  Medications   betamethasone acetate-betamethasone sodium phosphate (CELESTONE) injection 12 mg     Laterality: Left  Location/Site: C7-T1  Needle: 3.5 in., 20 ga. Tuohy  Needle Placement: Paramedian epidural space  Findings:  -Comments: Excellent flow of contrast into the epidural space.  Procedure Details: Using a paramedian approach from the side mentioned above, the region overlying the inferior lamina was localized under fluoroscopic visualization and the soft tissues overlying this structure were infiltrated with 4 ml. of 1% Lidocaine without Epinephrine. A # 20 gauge, Tuohy needle was inserted into the epidural space using a paramedian approach.  The epidural space was localized using loss of resistance along with contralateral oblique bi-planar fluoroscopic views.  After negative aspirate for air, blood, and CSF, a 2 ml. volume of Isovue-250 was injected into the epidural space and the flow of contrast was observed. Radiographs were obtained for documentation purposes.   The injectate was administered into the level noted above.  Additional Comments:  The patient tolerated the procedure well Dressing: 2 x 2 sterile gauze and Band-Aid    Post-procedure details: Patient was observed during the procedure. Post-procedure  instructions were reviewed.  Patient left the clinic in stable condition.

## 2021-06-25 ENCOUNTER — Encounter: Payer: Self-pay | Admitting: Physical Medicine and Rehabilitation

## 2021-06-28 ENCOUNTER — Encounter: Payer: Self-pay | Admitting: Physical Medicine and Rehabilitation

## 2021-06-28 DIAGNOSIS — M9907 Segmental and somatic dysfunction of upper extremity: Secondary | ICD-10-CM | POA: Diagnosis not present

## 2021-06-28 DIAGNOSIS — M542 Cervicalgia: Secondary | ICD-10-CM | POA: Diagnosis not present

## 2021-06-28 DIAGNOSIS — M25512 Pain in left shoulder: Secondary | ICD-10-CM | POA: Diagnosis not present

## 2021-06-28 DIAGNOSIS — M9901 Segmental and somatic dysfunction of cervical region: Secondary | ICD-10-CM | POA: Diagnosis not present

## 2021-06-28 DIAGNOSIS — M9902 Segmental and somatic dysfunction of thoracic region: Secondary | ICD-10-CM | POA: Diagnosis not present

## 2021-06-28 DIAGNOSIS — M9908 Segmental and somatic dysfunction of rib cage: Secondary | ICD-10-CM | POA: Diagnosis not present

## 2021-06-29 ENCOUNTER — Telehealth: Payer: Self-pay

## 2021-06-29 NOTE — Telephone Encounter (Signed)
PA initiated via [email or web address]  Gloris Manchester (KeySE:[id number])  Your information has been submitted to [facility]. [employer] will review the request and will issue a decision, typically within 3-7 days from your submission. You can check the updated outcome later by reopening this request.  If [employer] has not responded in 3-7 days or if you have any questions about your ePA request, please contact [employer] at [phone]. If you think there may be a problem with your PA request, use our live chat feature at the bottom right.  For [place] requests, please call [phone].

## 2021-07-01 NOTE — Telephone Encounter (Signed)
Dana Chambers  KeySE:[id number] -  PA Case ID: [id number] Need  help? Call us at [phone] Outcome: Approved on August 3 PA Case: [id number],  Status: Approved,  Coverage Starts on: 05/15/2020 12:00:00 AM, Coverage Ends on: 11/27/2021 12:00:00 AM. Questions? Contact [phone]. Drug Prolia '60MG'$ /ML syringes Form Nurse, adult and Medical Benefit PA Form

## 2021-07-01 NOTE — Telephone Encounter (Signed)
Pt ready for scheduling on or after 06/30/21  Out-of-pocket cost due at time of visit: $0  Primary: [employer] Prolia co-insurance: 0% Admin fee co-insurance: 0%   Secondary: n/a Prolia co-insurance:  Admin fee co-insurance:   Deductible: does not apply  Prior Auth: APPROVED PA# [id number] Valid:  05/15/20-11/27/21

## 2021-07-06 ENCOUNTER — Ambulatory Visit: Payer: Medicare PPO

## 2021-07-07 ENCOUNTER — Ambulatory Visit: Payer: Medicare PPO | Admitting: Endocrinology

## 2021-07-07 ENCOUNTER — Other Ambulatory Visit: Payer: Self-pay

## 2021-07-07 DIAGNOSIS — M818 Other osteoporosis without current pathological fracture: Secondary | ICD-10-CM

## 2021-07-07 MED ORDER — DENOSUMAB 60 MG/ML ~~LOC~~ SOSY
60.0000 mg | PREFILLED_SYRINGE | Freq: Once | SUBCUTANEOUS | Status: AC
  Administered 2021-07-07: 60 mg via SUBCUTANEOUS

## 2021-07-07 NOTE — Progress Notes (Signed)
Pt came in to receive her injection for Prolia . Pt was given injection successfully.

## 2021-07-10 NOTE — Telephone Encounter (Signed)
Pt received Prolia inj 07/07/21 Next inj due 01/08/22

## 2021-07-15 DIAGNOSIS — M9901 Segmental and somatic dysfunction of cervical region: Secondary | ICD-10-CM | POA: Diagnosis not present

## 2021-07-15 DIAGNOSIS — M4003 Postural kyphosis, cervicothoracic region: Secondary | ICD-10-CM | POA: Diagnosis not present

## 2021-07-15 DIAGNOSIS — M25512 Pain in left shoulder: Secondary | ICD-10-CM | POA: Diagnosis not present

## 2021-07-15 DIAGNOSIS — M9907 Segmental and somatic dysfunction of upper extremity: Secondary | ICD-10-CM | POA: Diagnosis not present

## 2021-07-15 DIAGNOSIS — M542 Cervicalgia: Secondary | ICD-10-CM | POA: Diagnosis not present

## 2021-07-15 DIAGNOSIS — M9902 Segmental and somatic dysfunction of thoracic region: Secondary | ICD-10-CM | POA: Diagnosis not present

## 2021-07-21 DIAGNOSIS — Z23 Encounter for immunization: Secondary | ICD-10-CM | POA: Diagnosis not present

## 2021-08-11 ENCOUNTER — Other Ambulatory Visit: Payer: Self-pay | Admitting: Obstetrics & Gynecology

## 2021-08-11 DIAGNOSIS — Z1231 Encounter for screening mammogram for malignant neoplasm of breast: Secondary | ICD-10-CM

## 2021-08-16 DIAGNOSIS — M9908 Segmental and somatic dysfunction of rib cage: Secondary | ICD-10-CM | POA: Diagnosis not present

## 2021-08-16 DIAGNOSIS — M25512 Pain in left shoulder: Secondary | ICD-10-CM | POA: Diagnosis not present

## 2021-08-16 DIAGNOSIS — M9902 Segmental and somatic dysfunction of thoracic region: Secondary | ICD-10-CM | POA: Diagnosis not present

## 2021-08-16 DIAGNOSIS — M9901 Segmental and somatic dysfunction of cervical region: Secondary | ICD-10-CM | POA: Diagnosis not present

## 2021-08-16 DIAGNOSIS — M542 Cervicalgia: Secondary | ICD-10-CM | POA: Diagnosis not present

## 2021-08-16 DIAGNOSIS — M9907 Segmental and somatic dysfunction of upper extremity: Secondary | ICD-10-CM | POA: Diagnosis not present

## 2021-08-17 ENCOUNTER — Encounter: Payer: Self-pay | Admitting: Family Medicine

## 2021-08-23 ENCOUNTER — Ambulatory Visit: Payer: Self-pay | Admitting: Internal Medicine

## 2021-08-27 ENCOUNTER — Ambulatory Visit: Payer: Medicare PPO | Admitting: Internal Medicine

## 2021-08-27 ENCOUNTER — Other Ambulatory Visit: Payer: Self-pay

## 2021-08-27 VITALS — BP 128/80 | HR 91 | Ht 66.0 in | Wt 143.6 lb

## 2021-08-27 DIAGNOSIS — E559 Vitamin D deficiency, unspecified: Secondary | ICD-10-CM

## 2021-08-27 DIAGNOSIS — R7303 Prediabetes: Secondary | ICD-10-CM | POA: Diagnosis not present

## 2021-08-27 DIAGNOSIS — M818 Other osteoporosis without current pathological fracture: Secondary | ICD-10-CM | POA: Diagnosis not present

## 2021-08-27 NOTE — Progress Notes (Signed)
Patient ID: Dana Chambers, female   DOB: 12-05-1945, 75 y.o.   MRN: [id number]  This visit occurred during the SARS-CoV-2 public health emergency.  Safety protocols were in place, including screening questions prior to the visit, additional usage of staff PPE, and extensive cleaning of exam room while observing appropriate contact time as indicated for disinfecting solutions.   HPI  Dana Chambers is a 75 y.o.-year-old female, initially referred by Dr. Audie Box, for management of osteoporosis.  Last visit 1 year ago.  Interim history: She denies dizziness, vertigo, vision problems. No falls or fractures since last visit. She had L shoulder pain in 11/2020 >> sees Dr. Berline Chough (suspected muscle problem) >> PT and dry needling - did not help. She had a neck MRI >> thyroid nodules >> U/S: small nodules, not worrisome. 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  Reviewed history: Pt was dx with OP in 2006.  Reviewed patient's DXA scan report: Date L1-L3 T score FN T score 33% distal Radius Ultra distal radius   10/19/2020 ([facility]) -0.3 (-1.6%) RFN: -2.4 (+1.5%) LFN: -2.5 (-6.8%*) -1.5 (+9.8%*) N/a  10/17/2018 ([facility]) -0.4 (+12.6%*) RFN: -2.5 (+2.0%) LFN: -2.1 (+3.9%) -2.4 (-3.6%) n/a  10/12/2016 ([facility])  -1.4 (-2%)  RFN: -2.6 LFN: -2.4  -2.1 (-2.9%)  n/a  10/12/2015 ([facility])  -1.3 (+7.1%*)  RFN: -2.6 LFN: -2.7 -1.8  n/a  10/09/2013 ([facility])  -1.8  RFN: -2.5 LFN: -2.2 -3.0 (-14.9%*)  -4.5   09/07/2011 ([facility])  n/a RFN: -2.2   -3.1 (+3.0%)   08/26/2009 ([facility])  -1.1  RFN: -1.7 LFN: -1.9     She denies any fractures. In the past, she had a fall (slipped) x1 >> on coccyx + hands >> no fxs, but developed traumatic carpal tunnel.    Reviewed previous osteoporosis treatments: - Bisphosphonates (Fosamax, Boniva) for approximately 10 years, followed by drug holiday - Prolia x 11inj now -started in 2016, latest: 11/26/2019   05/27/2020 12/30/2020 07/07/2021  We started supplementation in 2018.  Subsequent vitamin D levels were normal: Lab Results  Component Value Date   VD25OH 47.5 08/21/2020   VD25OH 67.38 08/19/2019   VD25OH 41.53 12/12/2018   VD25OH 40.81 08/13/2018   VD25OH 37.58 12/06/2017   VD25OH 26.10 (L) 08/10/2017   VD25OH 50 07/12/2011   VD25OH 55 06/28/2010   VD25OH 45 05/18/2009   VD25OH 37 10/21/2008   She is on vitamin D 2000 units daily.  No weightbearing exercises-for the last 16 years: [employer] - water walking 45-60 min twice a week.  In the past, she was also using weights but not anymore due to upper back pain.  Menopause was at 75 years old.  Pt does have a FH of osteoporosis: In mother, who also had dowagers hump. Mother's aunt also had this.  No history of hyper or hypocalcemia or hyperparathyroidism.  She has a history of kidney stones and had lithotripsy x2.  No history of hyper or hypocalcemia or hyperparathyroidism. Lab Results  Component Value Date   PTH 48 08/10/2017   PTH Comment 08/10/2017   CALCIUM 9.6 12/21/2020   CALCIUM 9.3 05/06/2020   CALCIUM 9.7 12/18/2019   CALCIUM 9.7 08/19/2019   CALCIUM 9.4 12/12/2018   CALCIUM 9.8 08/13/2018   CALCIUM 9.3 12/06/2017   CALCIUM 9.4 08/10/2017   CALCIUM 9.2 11/29/2016   CALCIUM 9.3 11/12/2015   No thyrotoxicosis: Lab Results  Component Value Date   TSH 1.35 04/16/2021   TSH 1.05 07/16/2018   TSH 1.05 11/29/2016  TSH 1.25 11/12/2015   TSH 1.11 10/02/2014   No CKD. Last BUN/Cr: Lab Results  Component Value Date   BUN 18 12/21/2020   CREATININE 0.83 12/21/2020   She had BrCA 2006, s/p RxTx, no ChTx >> she was on tamoxifen for 4.5 years.  She continues on PPIs for GERD, but the lower dose, 20 mg daily of Nexium.  She also has a history of neuropathy - mostly in L leg.  She is on B12 supplements.  She has a history of elevated HbA1c: Lab Results  Component Value Date   HGBA1C 6.0 12/21/2020   HGBA1C 6.2  08/21/2020   HGBA1C 5.7 05/06/2020   HGBA1C 5.7 12/18/2019   HGBA1C 5.9 08/19/2019   HGBA1C 5.9 12/12/2018   HGBA1C 5.8 07/16/2018   ROS: + see HPI -  + acid reflux - on Nexium 20 mg dose  I reviewed pt's medications, allergies, PMH, social hx, family hx, and changes were documented in the history of present illness. Otherwise, unchanged from my initial visit note.  Past Medical History:  Diagnosis Date   Basal cell carcinoma of skin    left ankle, right thigh   Bursitis    right hip   Cancer ([place]) 2006   Breast/right   COVID 01/27/2021   Diverticulosis    GERD (gastroesophageal reflux disease)    History of breast cancer/33 radiation treatments/ no chemo    2006  S/P RIGHT LUMPECTOMY AND RADIATION FOLLOWED BY DR RUBIN--  NO RECURRENCE   History of kidney stones    Hyperplastic colon polyp    IBS (irritable bowel syndrome)    Idiopathic peripheral neuropathy    bilateral tingling/ due to fall in [facility]   Internal hemorrhoid    Kidney stone    Leiomyoma    Multiple small   Liver cyst 2006   Benign   OA (osteoarthritis)    Osteoporosis 2019   T score -2.5   Personal history of radiation therapy    Raynaud disease    Renal calculus, left    non obstructive   Rib pain on left side    able to lay on left side   Rosacea    Tinnitus    Past Surgical History:  Procedure Laterality Date   BREAST BIOPSY Left 2016   benign   BREAST EXCISIONAL BIOPSY Right 1973   benign   BREAST LUMPECTOMY  2006    Right -- S/P RADIATION--  NO RECURRENCE   DILATION AND CURETTAGE OF UTERUS     EXTRACORPOREAL SHOCK WAVE LITHOTRIPSY     X2   HYSTEROSCOPY     HYSTEROSCOPY WITH D & C  08/03/2012   Procedure: DILATATION AND CURETTAGE /HYSTEROSCOPY;  Surgeon: Anastasio Auerbach, MD;  Location: [facility];  Service: Gynecology;  Laterality: N/A;   SUPRANUMERARY NIPPLE EXCISION  1973   CYST   TONSILLECTOMY  AGE 71   Social History   Social History   Marital status:  Married    Spouse name: N/A   Number of children: 1   Occupational History   Retired- Psychologist, prison and probation services Also, used to sing >> now hoarseness   Social History Main Topics   Smoking status: Never Smoker   Smokeless tobacco: Never Used   Alcohol use 3.6 oz/week    6 Standard drinks or equivalent per week   Drug use: No   Social History Narrative   Married (husband patient of Dr. Yong Channel). 1 daughter. 1 granddaughter.  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       Hobbies: reading, travel, time with granddaughter. Very active   Current Outpatient Medications on File Prior to Visit  Medication Sig Dispense Refill   acetaminophen (TYLENOL) 500 MG tablet Take 1,000 mg by mouth 3 (three) times daily as needed.     Cholecalciferol (VITAMIN D PO) Take by mouth.     Cyanocobalamin (VITAMIN B 12 PO) Take by mouth.     denosumab (PROLIA) 60 MG/ML SOSY injection Inject 60 mg into the skin every 6 (six) months.     esomeprazole (NEXIUM) 20 MG capsule TAKE ONE CAPSULE BY MOUTH EVERY DAY AT NOON 90 capsule 3   No current facility-administered medications on file prior to visit.   Allergies  Allergen Reactions   Amoxicillin Diarrhea    GI upset   Family History  Problem Relation Age of Onset   Dementia Mother    Depression Mother    Osteoporosis Mother    Stroke Father    Heart attack Father 23   Rectal cancer Maternal Grandmother    Colon cancer Maternal Grandmother    Stroke Sister        TIA. ? dementia. another stroke after covid then feeding issues led to eath   Dementia Sister    Breast cancer Neg Hx     PE: LMP  (LMP Unknown)  Wt Readings from Last 3 Encounters:  02/26/21 141 lb 9.6 oz (64.2 kg)  12/21/20 142 lb (64.4 kg)  09/21/20 143 lb (64.9 kg)   Constitutional: normal weight, in NAD Eyes: PERRLA, EOMI, no exophthalmos ENT: moist mucous membranes, no thyromegaly, no cervical lymphadenopathy Cardiovascular: RRR, No MRG Respiratory: CTA B Gastrointestinal: abdomen  soft, NT, ND, BS+ Musculoskeletal: no deformities, strength intact in all 4 Skin: moist, warm, no rashes Neurological: no tremor with outstretched hands, DTR normal in all 4  Assessment: 1. Osteoporosis Lancet Diabetes Endocrinol. 2017 Jul;5(7):513-523. doi: 10.1016/S2213-8587(17)30138-9. Epub 2017 May 22.  10 years of denosumab treatment in postmenopausal women with osteoporosis: results from the phase 3 randomised FREEDOM trial and open-label extension. Bone HG et al.  Denosumab treatment for up to 10 years was associated with low rates of adverse events, low fracture incidence compared with that observed during the original trial, and continued increases in BMD without plateau.  - she denies fractures but she has a decrease in height from 5 ft 8 in to 5 ft 6.5 in over the years  2.  Vitamin D insufficiency  3.  Elevated HbA1c  Plan: 1. Osteoporosis -Likely age-related/postmenopausal.  She also has a family history of osteoporosis -No fractures since last visit -We discussed about fall precautions -We reviewed her latest bone density scan obtained on 10/19/2020: Her bone density improved at most sites with exception of the left hip T score, which was worse, however, it may be that the 2019 T score was an outlier so we discussed about waiting for the new bone density in 2023 to compare with the last study. -She is up-to-date with her Prolia injections, she had 11 so far, last  06/2021.  She is due for another injection in 12/2021 -She tolerates Prolia well, without jaw, hip, thigh pain -We can continue Prolia for 10 years or more, if needed.  We started in 2016. -After we finished Prolia, we we will need to give her 1 to 2 years of bisphosphonates, but we did discuss that we cannot stop this abruptly due to the loss of bone density and  increased fracture risk in that case -We reviewed the results of her BMP and she has a normal kidney function; vitamin D level was also normal at last  check -At last visit I recommended weightbearing exercises and increase walking -I will see her back in 1 year  2.  Vitamin D insufficiency -Patient's vitamin D level was normal at last check a year ago, at 43.5. -she continues on 2000 units vitamin D daily -We will recheck her vitamin D level today  3.  Elevated HbA1c -This remains in the prediabetic range: Lab Results  Component Value Date   HGBA1C 6.0 12/21/2020   HGBA1C 6.2 08/21/2020   HGBA1C 5.7 05/06/2020   HGBA1C 5.7 12/18/2019   HGBA1C 5.9 08/19/2019   HGBA1C 5.9 12/12/2018   HGBA1C 5.8 07/16/2018  -We discussed in the past about cutting down fatty foods like cheese, meat, eggs -She feels that the HbA1c from 07/2020 was higher after she started a statin.  Latest HbA1c from 11/2020 improved after she stopped the statin. -We do not need to start medication for now -We will recheck her HbA1c today  Component     Latest Ref Rng & Units 08/27/2021 08/27/2021        11:16 AM 11:16 AM  Glucose     70 - 99 mg/dL 90 90  BUN     8 - 27 mg/dL 16 16  Creatinine     0.57 - 1.00 mg/dL 0.76 0.80  eGFR     >59 mL/min/1.73 81 76  BUN/Creatinine Ratio     12 - $Re'28 21 20  'UCI$ Sodium     134 - 144 mmol/L 143 143  Potassium     3.5 - 5.2 mmol/L 4.9 5.4 (H)  Chloride     96 - 106 mmol/L 102 102  CO2     20 - 29 mmol/L 25 24  Calcium     8.7 - 10.3 mg/dL 9.8 10.2  Hemoglobin A1C     4.8 - 5.6 % 5.9 (H) CANCELED  Est. average glucose Bld gHb Est-mCnc     mg/dL 123   Vitamin D, 25-Hydroxy     30.0 - 100.0 ng/mL 53.7 57.7  HbA1c is 5.9%, slightly lower than before. The rest of the labs are normal, including the vitamin D, however, one of the potassium levels was elevated.  Since there are 2 samples run, I would assume that one of the sample was hemolyzed.  Philemon Kingdom, MD PhD [facility]

## 2021-08-27 NOTE — Patient Instructions (Signed)
Please continue vitamin D 2000 units daily.  Please stop at the lab.  Please come back for a follow-up appointment in 1 year. 

## 2021-08-28 LAB — BASIC METABOLIC PANEL
BUN/Creatinine Ratio: 21 (ref 12–28)
BUN: 16 mg/dL (ref 8–27)
CO2: 25 mmol/L (ref 20–29)
Calcium: 9.8 mg/dL (ref 8.7–10.3)
Chloride: 102 mmol/L (ref 96–106)
Creatinine, Ser: 0.76 mg/dL (ref 0.57–1.00)
Glucose: 90 mg/dL (ref 70–99)
Potassium: 4.9 mmol/L (ref 3.5–5.2)
Sodium: 143 mmol/L (ref 134–144)
eGFR: 81 mL/min/{1.73_m2} (ref >59)

## 2021-08-28 LAB — HEMOGLOBIN A1C
Est. average glucose Bld gHb Est-mCnc: 123 mg/dL
Hgb A1c MFr Bld: 5.9 % — ABNORMAL HIGH (ref 4.8–5.6)

## 2021-08-28 LAB — VITAMIN D 25 HYDROXY (VIT D DEFICIENCY, FRACTURES): Vit D, 25-Hydroxy: 53.7 ng/mL (ref 30.0–100.0)

## 2021-08-30 ENCOUNTER — Encounter: Payer: Self-pay | Admitting: Internal Medicine

## 2021-08-30 DIAGNOSIS — H2513 Age-related nuclear cataract, bilateral: Secondary | ICD-10-CM | POA: Diagnosis not present

## 2021-08-30 DIAGNOSIS — H40033 Anatomical narrow angle, bilateral: Secondary | ICD-10-CM | POA: Diagnosis not present

## 2021-08-30 DIAGNOSIS — H11441 Conjunctival cysts, right eye: Secondary | ICD-10-CM | POA: Diagnosis not present

## 2021-08-30 DIAGNOSIS — H40013 Open angle with borderline findings, low risk, bilateral: Secondary | ICD-10-CM | POA: Diagnosis not present

## 2021-08-30 LAB — BASIC METABOLIC PANEL
BUN/Creatinine Ratio: 20 (ref 12–28)
BUN: 16 mg/dL (ref 8–27)
CO2: 24 mmol/L (ref 20–29)
Calcium: 10.2 mg/dL (ref 8.7–10.3)
Chloride: 102 mmol/L (ref 96–106)
Creatinine, Ser: 0.8 mg/dL (ref 0.57–1.00)
Glucose: 90 mg/dL (ref 70–99)
Potassium: 5.4 mmol/L — ABNORMAL HIGH (ref 3.5–5.2)
Sodium: 143 mmol/L (ref 134–144)
eGFR: 76 mL/min/{1.73_m2} (ref >59)

## 2021-08-30 LAB — HEMOGLOBIN A1C

## 2021-08-30 LAB — VITAMIN D 25 HYDROXY (VIT D DEFICIENCY, FRACTURES): Vit D, 25-Hydroxy: 57.7 ng/mL (ref 30.0–100.0)

## 2021-08-31 ENCOUNTER — Encounter: Payer: Self-pay | Admitting: Internal Medicine

## 2021-09-22 ENCOUNTER — Other Ambulatory Visit (HOSPITAL_COMMUNITY)
Admission: RE | Admit: 2021-09-22 | Discharge: 2021-09-22 | Disposition: A | Discharge status: Home / Self Care | Payer: Medicare PPO | Source: Ambulatory Visit | Attending: Obstetrics & Gynecology | Admitting: Obstetrics & Gynecology

## 2021-09-22 ENCOUNTER — Encounter: Payer: Self-pay | Admitting: Obstetrics & Gynecology

## 2021-09-22 ENCOUNTER — Other Ambulatory Visit: Payer: Self-pay

## 2021-09-22 ENCOUNTER — Ambulatory Visit (INDEPENDENT_AMBULATORY_CARE_PROVIDER_SITE_OTHER): Payer: Medicare PPO | Admitting: Obstetrics & Gynecology

## 2021-09-22 VITALS — BP 118/80 | HR 81 | Resp 16 | Ht 65.75 in | Wt 141.0 lb

## 2021-09-22 DIAGNOSIS — Z9189 Other specified personal risk factors, not elsewhere classified: Secondary | ICD-10-CM

## 2021-09-22 DIAGNOSIS — R8290 Unspecified abnormal findings in urine: Secondary | ICD-10-CM

## 2021-09-22 DIAGNOSIS — Z853 Personal history of malignant neoplasm of breast: Secondary | ICD-10-CM

## 2021-09-22 DIAGNOSIS — Z9289 Personal history of other medical treatment: Secondary | ICD-10-CM | POA: Diagnosis not present

## 2021-09-22 DIAGNOSIS — Z780 Asymptomatic menopausal state: Secondary | ICD-10-CM

## 2021-09-22 DIAGNOSIS — M810 Age-related osteoporosis without current pathological fracture: Secondary | ICD-10-CM

## 2021-09-22 DIAGNOSIS — Z01419 Encounter for gynecological examination (general) (routine) without abnormal findings: Secondary | ICD-10-CM | POA: Diagnosis not present

## 2021-09-22 MED ORDER — SULFAMETHOXAZOLE-TRIMETHOPRIM 800-160 MG PO TABS: 1.0000 | ORAL_TABLET | Freq: Two times a day (BID) | ORAL | 0 refills | Status: AC

## 2021-09-22 NOTE — Progress Notes (Addendum)
Dana Chambers May 03, 1946 [id number]   History:    75 y.o. G1P1L1  RP:  Established patient presenting for annual gyn exam   HPI: Postmenopausal, well on no HRT.  No significant hot flashes or night sweats.  No vaginal bleeding. Pap smear Neg 2019.  No significant history of abnormal Pap smears.  Breasts normal.  H/O Rt breast Ca.  Mammo 03/2021.  [place] 2018.  Osteoporosis on Prolia.  DEXA 09/2020.  Following with Dr. Cruzita Lederer.  BMI 22.93.  Health labs with Fam MD.  Past medical history,surgical history, family history and social history were all reviewed and documented in the EPIC chart.  Gynecologic History No LMP recorded (lmp unknown). Patient is postmenopausal.  Obstetric History OB History  Gravida Para Term Preterm AB Living  1 1 1     1   SAB IAB Ectopic Multiple Live Births               # Outcome Date GA Lbr Len/2nd Weight Sex Delivery Anes PTL Lv  1 Term              ROS: A ROS was performed and pertinent positives and negatives are included in the history.  GENERAL: No fevers or chills. HEENT: No change in vision, no earache, sore throat or sinus congestion. NECK: No pain or stiffness. CARDIOVASCULAR: No chest pain or pressure. No palpitations. PULMONARY: No shortness of breath, cough or wheeze. GASTROINTESTINAL: No abdominal pain, nausea, vomiting or diarrhea, melena or bright red blood per rectum. GENITOURINARY: No urinary frequency, urgency, hesitancy or dysuria. MUSCULOSKELETAL: No joint or muscle pain, no back pain, no recent trauma. DERMATOLOGIC: No rash, no itching, no lesions. ENDOCRINE: No polyuria, polydipsia, no heat or cold intolerance. No recent change in weight. HEMATOLOGICAL: No anemia or easy bruising or bleeding. NEUROLOGIC: No headache, seizures, numbness, tingling or weakness. PSYCHIATRIC: No depression, no loss of interest in normal activity or change in sleep pattern.     Exam:   BP 118/80   Pulse 81   Resp 16   Ht 5' 5.75" (1.67 m)   Wt 141  lb (64 kg)   LMP  (LMP Unknown)   BMI 22.93 kg/m   Body mass index is 22.93 kg/m.  General appearance : Well developed well nourished female. No acute distress HEENT: Eyes: no retinal hemorrhage or exudates,  Neck supple, trachea midline, no carotid bruits, no thyroidmegaly Lungs: Clear to auscultation, no rhonchi or wheezes, or rib retractions  Heart: Regular rate and rhythm, no murmurs or gallops Breast:Examined in sitting and supine position were symmetrical in appearance, no palpable masses or tenderness,  no skin retraction, no nipple inversion, no nipple discharge, no skin discoloration, no axillary or supraclavicular lymphadenopathy Abdomen: no palpable masses or tenderness, no rebound or guarding Extremities: no edema or skin discoloration or tenderness  Pelvic: Vulva: Normal             Vagina: No gross lesions or discharge  Cervix: No gross lesions or discharge.  Pap reflex done.  Uterus  AV, normal size, shape and consistency, non-tender and mobile  Adnexa  Without masses or tenderness  Anus: Normal  U/A: Yellow cloudy, protein negative, nitrites positive, white blood cells 20-40, red blood cells 0-2, bacteria many.  Urine culture pending.   Assessment/Plan:  75 y.o. female for annual exam   1. Wellness gyn exam at high risk for H/O breast cancer Postmenopausal, well on no HRT.  No significant hot flashes or night sweats.  No vaginal bleeding. Pap smear Neg 2019. Pap reflex done today. No significant history of abnormal Pap smears.  Breasts normal.  H/O Rt breast Ca.  Mammo 03/2021.  [place] 2018.  Osteoporosis on Prolia.  DEXA 09/2020.  Following with Dr. Cruzita Lederer.  BMI 22.93.  Health labs with Fam MD. - Cytology - PAP( [place])  2. Postmenopause Postmenopausal, well on no HRT.  No significant hot flashes or night sweats.  No vaginal bleeding.   3. Postmenopausal osteoporosis BD 09/2020.  Followed by Dr Orpah Greek on [place].  4. Personal history of breast cancer Rt  Breast Ca.  5. Abnormal urine odor U/A abnormal.  Acute Cystitis.  Treat with Bactrim DS. - Urinalysis,Complete w/RFL Culture  6. Personal history of other medical treatment  Other orders - Urine Culture - REFLEXIVE URINE CULTURE - sulfamethoxazole-trimethoprim (BACTRIM DS) 800-160 MG tablet; Take 1 tablet by mouth 2 (two) times daily for 3 days.   Princess Bruins MD, 1:50 PM 09/22/2021

## 2021-09-23 LAB — CYTOLOGY - PAP: Diagnosis: NEGATIVE

## 2021-09-24 ENCOUNTER — Other Ambulatory Visit: Payer: Self-pay

## 2021-09-24 ENCOUNTER — Ambulatory Visit
Admission: RE | Admit: 2021-09-24 | Discharge: 2021-09-24 | Disposition: A | Discharge status: Home / Self Care | Payer: Medicare PPO | Source: Ambulatory Visit | Attending: Obstetrics & Gynecology | Admitting: Obstetrics & Gynecology

## 2021-09-24 ENCOUNTER — Encounter: Payer: Self-pay | Admitting: Obstetrics & Gynecology

## 2021-09-24 DIAGNOSIS — Z1231 Encounter for screening mammogram for malignant neoplasm of breast: Secondary | ICD-10-CM

## 2021-09-25 LAB — URINALYSIS, COMPLETE W/RFL CULTURE
Bilirubin Urine: NEGATIVE
Casts: NONE SEEN /LPF
Crystals: NONE SEEN /HPF
Glucose, UA: NEGATIVE
Hgb urine dipstick: NEGATIVE
Hyaline Cast: NONE SEEN /LPF
Ketones, ur: NEGATIVE
Nitrites, Initial: POSITIVE — AB
Protein, ur: NEGATIVE
Specific Gravity, Urine: 1.008 (ref 1.001–1.035)
Yeast: NONE SEEN /HPF
pH: 5 (ref 5.0–8.0)

## 2021-09-25 LAB — URINE CULTURE
MICRO NUMBER:: 12554150
SPECIMEN QUALITY:: ADEQUATE

## 2021-09-25 LAB — CULTURE INDICATED

## 2021-10-18 NOTE — Addendum Note (Signed)
Addended by: Princess Bruins on: 10/18/2021 08:08 AM   Modules accepted: Level of Service

## 2021-12-09 NOTE — Telephone Encounter (Signed)
2023 VOB initiated for Prolia °

## 2021-12-10 ENCOUNTER — Other Ambulatory Visit: Payer: Self-pay | Admitting: Family Medicine

## 2021-12-15 DIAGNOSIS — D1801 Hemangioma of skin and subcutaneous tissue: Secondary | ICD-10-CM | POA: Diagnosis not present

## 2021-12-15 DIAGNOSIS — D220 Melanocytic nevi of lip: Secondary | ICD-10-CM | POA: Diagnosis not present

## 2021-12-15 DIAGNOSIS — D2271 Melanocytic nevi of right lower limb, including hip: Secondary | ICD-10-CM | POA: Diagnosis not present

## 2021-12-15 DIAGNOSIS — L821 Other seborrheic keratosis: Secondary | ICD-10-CM | POA: Diagnosis not present

## 2021-12-15 DIAGNOSIS — C44712 Basal cell carcinoma of skin of right lower limb, including hip: Secondary | ICD-10-CM | POA: Diagnosis not present

## 2021-12-15 DIAGNOSIS — D2261 Melanocytic nevi of right upper limb, including shoulder: Secondary | ICD-10-CM | POA: Diagnosis not present

## 2021-12-15 DIAGNOSIS — D485 Neoplasm of uncertain behavior of skin: Secondary | ICD-10-CM | POA: Diagnosis not present

## 2021-12-15 DIAGNOSIS — D2262 Melanocytic nevi of left upper limb, including shoulder: Secondary | ICD-10-CM | POA: Diagnosis not present

## 2021-12-15 DIAGNOSIS — L814 Other melanin hyperpigmentation: Secondary | ICD-10-CM | POA: Diagnosis not present

## 2021-12-15 DIAGNOSIS — D225 Melanocytic nevi of trunk: Secondary | ICD-10-CM | POA: Diagnosis not present

## 2021-12-21 NOTE — Telephone Encounter (Signed)
Prior auth required for PROLIA ? ?PA PROCESS DETAILS: PA is required. PA can be initiated by calling [phone] or online at ?[email or web address] ? ?

## 2021-12-22 NOTE — Telephone Encounter (Signed)
Prior auth initiated via [email or web address] KEY: [id number]

## 2021-12-24 NOTE — Progress Notes (Signed)
Phone [phone]   Subjective:  Patient presents today for their annual physical. Chief complaint-noted.   See problem oriented charting- ROS- full  review of systems was completed and negative except for: weight up- enjoyed the holidays, occasionally wakes up mildly damp if back is bothering her but most times ok, ongoing neuropathy bilateral legs, Gum issus- working with Dr. Judy Pimple, hearing loss with nonpulsatile tinnitus, thyroid nodules known, stye currently- encouraged warm compresses, may get cataracts in June, knee swelling at times, occasional palpitations- attempted fraud on laptop that stressed her out, IBs type symptoms- diarrhea at times, cold intolerance particularly with raynauds, joint pain, back pain- had seen Dr. Berline Chough -basically tolerating (epidural was not very helpful- left neck/shoulder/upper back), knee pain, muscle aches, basal cell treatment right leg  The following were reviewed and entered/updated in epic: Past Medical History:  Diagnosis Date   Basal cell carcinoma of skin    left ankle, right thigh   Bursitis    right hip   Cancer (HCC) 2006   Breast/right   COVID 01/27/2021   Diverticulosis    GERD (gastroesophageal reflux disease)    History of breast cancer/33 radiation treatments/ no chemo    2006  S/P RIGHT LUMPECTOMY AND RADIATION FOLLOWED BY DR Donnie Coffin--  NO RECURRENCE   History of kidney stones    Hyperplastic colon polyp    IBS (irritable bowel syndrome)    Idiopathic peripheral neuropathy    bilateral tingling/ due to fall in [facility]   Internal hemorrhoid    Kidney stone    Leiomyoma    Multiple small   Liver cyst 2006   Benign   OA (osteoarthritis)    Osteoporosis 2019   T score -2.5   Personal history of radiation therapy    Raynaud disease    Renal calculus, left    non obstructive   Rib pain on left side    able to lay on left side   Rosacea    Tinnitus    Patient Active Problem List   Diagnosis Date Noted   Hyperlipidemia-  february 2020 coronary Calcium score of 0 12/12/2018    Priority: Medium    Vitamin D insufficiency 08/13/2018    Priority: Medium    Elevated glycosylated hemoglobin 08/13/2018    Priority: Medium    Rib pain on left side 03/21/2017    Priority: Medium    Scoliosis 03/21/2017    Priority: Medium    Osteoporosis 06/29/2009    Priority: Medium    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    Priority: Medium    GERD 05/01/1999    Priority: Medium    Hearing loss associated with syndrome of both ears 12/05/2016    Priority: Low   Neuropathy 03/06/2012    Priority: Low   COLONIC POLYPS 05/25/2006    Priority: Low   Past Surgical History:  Procedure Laterality Date   BREAST BIOPSY Left 2016   benign   BREAST EXCISIONAL BIOPSY Right 1973   benign   BREAST LUMPECTOMY  2006    Right -- S/P RADIATION--  NO RECURRENCE   DILATION AND CURETTAGE OF UTERUS     EXTRACORPOREAL SHOCK WAVE LITHOTRIPSY     X2   HYSTEROSCOPY     HYSTEROSCOPY WITH D & C  08/03/2012   Procedure: DILATATION AND CURETTAGE /HYSTEROSCOPY;  Surgeon: Dara Lords, MD;  Location: [facility];  Service: Gynecology;  Laterality: N/A;   SUPRANUMERARY NIPPLE EXCISION  1973  CYST   TONSILLECTOMY  AGE 57    Family History  Problem Relation Age of Onset   Dementia Mother    Depression Mother    Osteoporosis Mother    Stroke Father    Heart attack Father 3   Rectal cancer Maternal Grandmother    Colon cancer Maternal Grandmother    Stroke Sister        TIA. ? dementia. another stroke after covid then feeding issues led to eath   Dementia Sister    Breast cancer Neg Hx     Medications- reviewed and updated Current Outpatient Medications  Medication Sig Dispense Refill   acetaminophen (TYLENOL) 500 MG tablet Take 1,000 mg by mouth 3 (three) times daily as needed.     Cholecalciferol (VITAMIN D PO) Take by mouth.     Cyanocobalamin (VITAMIN B 12 PO) Take by mouth.     denosumab  (PROLIA) 60 MG/ML SOSY injection Inject 60 mg into the skin every 6 (six) months.     esomeprazole (NEXIUM) 20 MG capsule TAKE ONE CAPSULE BY MOUTH EVERY DAY AT NOON 90 capsule 3   No current facility-administered medications for this visit.    Allergies-reviewed and updated Allergies  Allergen Reactions   Amoxicillin Diarrhea    GI upset    Social History   Social History Narrative   Married (husband patient of Dr. Yong Channel). 1 daughter. 1 granddaughter.       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       Hobbies: reading, travel, time with granddaughter. Very active   Objective  Objective:  BP 110/70    Pulse 78    Temp (!) 97.2 F (36.2 C)    Ht 5' 5.75" (1.67 m)    Wt 144 lb 3.2 oz (65.4 kg)    LMP  (LMP Unknown)    SpO2 100%    BMI 23.45 kg/m  Gen: NAD, resting comfortably HEENT: Mucous membranes are moist. Oropharynx normal Neck: no thyromegaly CV: RRR no murmurs rubs or gallops Lungs: CTAB no crackles, wheeze, rhonchi Abdomen: soft/nontender/nondistended/normal bowel sounds. No rebound or guarding.  Ext: minimal edema Skin: warm, dry Neuro: grossly normal, moves all extremities, PERRLA   Assessment and Plan   76 y.o. female presenting for annual physical.  Health Maintenance counseling: 1. Anticipatory guidance: Patient counseled regarding regular dental exams -q6 months, eye exams - at least yearly,  avoiding smoking and second hand smoke , limiting alcohol to 1 beverage per day- -still 1-2 a day down to 1 to 1.5- discussed cutting back to 1 once again- harder with covid . No illicit drugs.  2. Risk factor reduction:  Advised patient of need for regular exercise and diet rich and fruits and vegetables to reduce risk of heart attack and stroke.  Exercise- pool limited by wound on leg for basal cell. Hoping to get back soon to the pool but may need another procedure.  Diet/Management-husband loves to cook which can make it harder- butter and bacon intake could be lower plus  enjoyed holidays.  Normal range BMI Wt Readings from Last 3 Encounters:  12/27/21 144 lb 3.2 oz (65.4 kg)  09/22/21 141 lb (64 kg)  08/27/21 143 lb 9.6 oz (65.1 kg)  3. Immunizations/screenings/ancillary studies DISCUSSED:  -Urine Microalbumin - patient does not have diabetes and we discussed she does not need this Immunization History  Administered Date(s) Administered   Fluad Quad(high Dose 65+) 07/29/2020   Influenza Whole 10/29/2009, 08/11/2010, 08/19/2011   Influenza, High Dose  Seasonal PF 09/29/2016, 08/21/2017, 08/01/2018, 07/15/2019, 07/29/2020, 07/21/2021   Influenza,inj,Quad PF,6+ Mos 08/21/2013, 09/02/2014   Influenza-Unspecified 09/09/2015, 08/01/2018, 07/17/2019   PFIZER Comirnaty(Gray Top)Covid-19 Tri-Sucrose Vaccine 04/08/2021   PFIZER(Purple Top)SARS-COV-2 Vaccination 12/23/2019, 01/13/2020, 07/31/2020   Pfizer Covid-19 Vaccine Bivalent Booster 7yrs & up 08/11/2021   Pneumococcal Conjugate-13 09/02/2014   Pneumococcal Polysaccharide-23 03/06/2012   Tdap 03/06/2012   Zoster Recombinat (Shingrix) 03/10/2018, 05/10/2018   4. Cervical cancer screening- past formal age based screening recommendations.  No vaginal bleeding or discharge noted. Still seeing Dr. Dellis Filbert now 5. Breast cancer screening-  breast exam-self exams monthly and sees GYN  and mammogram 09/24/21 with 1 year repeat planned- did get a recall but back on annual now. History of Paget's disease.  6. Colon cancer screening -colonoscopy 02/06/17 with 5 year repeat planned-  he breast with surgery and radiation in 2006  7. Skin cancer screening- -followed by dermatology Dr. Ubaldo Glassing- will be retiring. advised regular sunscreen use. advised regular sunscreen use. Denies worrisome, changing, or new skin lesions.  8. Birth control/STD check- - postmenopausal and monogamous 9. Osteoporosis screening at 81- DEXA 10/19/20- see discussion below for osteoporosis -Never smoker  Status of chronic or acute concerns    #hyperlipidemia-patient with history of coronary calcium score of 0 on 01/16/2019. S: Medication: None at present  with family history patient was opted to take rosuvastatin 10 mg once a week in the past with incredible improvement of LDL from over 200-113. She came off statin due to increased a1c Lab Results  Component Value Date   CHOL 306 (H) 12/21/2020   HDL 82.30 12/21/2020   LDLCALC 200 (H) 12/21/2020   LDLDIRECT 168.5 02/02/2012   TRIG 119.0 12/21/2020   CHOLHDL 4 12/21/2020  A/P: update lipids- focus on healthy eating- unlikely to start statin with prior a1c increase on once a week. Consider repeat 2025 ct cardiac with how high lipids are.  -consider zetia alternate  # GERD S:Medication: Nexium 20 mg-has required 40 mg in the past -With osteoporosis history prefer lowest dose possible  B12 levels related to PPI use: Low normal in 2019 and empirically takes B12 now A/P: pepcid 10-20 mg with breakfast or dinner may be helpful and be lower risk- she may trial   # Hyperglycemia/insulin resistance/prediabetes-peak A1c of 6.2 in the past-increased while on statin S:  Medication: none Lab Results  Component Value Date   HGBA1C 5.9 (H) 08/27/2021   HGBA1C CANCELED 08/27/2021  A/P: hopefully stable- prefers to check a1c today  # Osteoporosis-follows with Dr. Cruzita Lederer S: Last DEXA: 10/19/2020 with slight worsening in left femur neck- will be due later this year.   Medication: Prolia through Dr. Cruzita Lederer  Calcium: $RemoveB'1200mg'SPJvXBTM$  (through diet ok) recommended - she is concerned about stones and we discussed getting at least $Remove'600mg'IENHORx$  per day.  Vitamin D: 1000 units a day recommended-vitamin D has been Last vitamin D Lab Results  Component Value Date   VD25OH 53.7 08/27/2021   VD25OH 57.7 08/27/2021  A/P: has been stable- encouraged follow up with Dr. Cruzita Lederer for prolia   #Multinodular goiter-noted on ultrasound Apr 21, 2021-no areas met criteria for biopsy. - Patient reports no changes in  neck - Check TSH at least annually- update today   #Musculoskeletal concerns-was referred in January 2022 to Dr. Paulla Fore for left upper back pain-later with neck pain/left arm tingling-has seen him in the past and prefers to follow-up with him if possible- overall stable and even with epidural did not resolve- wants to monitor/tolerate  -  Physical therapy in 2022 seemed to worsen symptoms  Recommended follow up: Return in about 1 year (around 12/27/2022) for physical or sooner if needed. Future Appointments  Date Time Provider [place]  02/07/2022  8:45 AM [facility] HEALTH COACH [facility]  08/31/2022 10:00 AM Philemon Kingdom, MD [facility] None  09/23/2022 10:00 AM Princess Bruins, MD [facility] None  01/03/2023  9:40 AM Yong Channel Brayton Mars, MD [facility]   Lab/Order associations: fasting   ICD-10-CM   1. Preventative health care  Z00.00     2. Hyperlipidemia associated with type 2 diabetes mellitus ([place])  E11.69    E78.5     3. Hyperglycemia  R73.9     4. Gastroesophageal reflux disease, unspecified whether esophagitis present  K21.9     5. Multinodular goiter  E04.2     6. History of UTI  Z87.440     7. Screen for colon cancer  Z12.11       No orders of the defined types were placed in this encounter.   I,Jada Bradford,acting as a scribe for Garret Reddish, MD.,have documented all relevant documentation on the behalf of Garret Reddish, MD,as directed by  Garret Reddish, MD while in the presence of Garret Reddish, MD.   I, Garret Reddish, MD, have reviewed all documentation for this visit. The documentation on 12/27/21 for the exam, diagnosis, procedures, and orders are all accurate and complete.   Return precautions advised.  Garret Reddish, MD

## 2021-12-25 NOTE — Telephone Encounter (Signed)
Pt ready for scheduling on or after 01/08/22  Out-of-pocket cost due at time of visit: $0  Primary: [employer] Prolia co-insurance: 0% Admin fee co-insurance: 0%  Secondary: n/a Prolia co-insurance:  Admin fee co-insurance:   Deductible: does not apply  Prior Auth: APPROVED PA# [id number] Valid: 05/15/20-11/27/22    ** This summary of benefits is an estimation of the patient's out-of-pocket cost. Exact cost may very based on individual plan coverage.

## 2021-12-27 ENCOUNTER — Ambulatory Visit (INDEPENDENT_AMBULATORY_CARE_PROVIDER_SITE_OTHER): Payer: Medicare PPO | Admitting: Family Medicine

## 2021-12-27 ENCOUNTER — Other Ambulatory Visit: Payer: Self-pay

## 2021-12-27 ENCOUNTER — Encounter: Payer: Self-pay | Admitting: Family Medicine

## 2021-12-27 VITALS — BP 110/70 | HR 78 | Temp 97.2°F | Ht 65.75 in | Wt 144.2 lb

## 2021-12-27 DIAGNOSIS — E1169 Type 2 diabetes mellitus with other specified complication: Secondary | ICD-10-CM

## 2021-12-27 DIAGNOSIS — E785 Hyperlipidemia, unspecified: Secondary | ICD-10-CM

## 2021-12-27 DIAGNOSIS — K219 Gastro-esophageal reflux disease without esophagitis: Secondary | ICD-10-CM

## 2021-12-27 DIAGNOSIS — Z87440 Personal history of urinary (tract) infections: Secondary | ICD-10-CM

## 2021-12-27 DIAGNOSIS — Z0000 Encounter for general adult medical examination without abnormal findings: Secondary | ICD-10-CM | POA: Diagnosis not present

## 2021-12-27 DIAGNOSIS — E042 Nontoxic multinodular goiter: Secondary | ICD-10-CM

## 2021-12-27 DIAGNOSIS — Z1211 Encounter for screening for malignant neoplasm of colon: Secondary | ICD-10-CM | POA: Diagnosis not present

## 2021-12-27 DIAGNOSIS — R739 Hyperglycemia, unspecified: Secondary | ICD-10-CM | POA: Diagnosis not present

## 2021-12-27 DIAGNOSIS — R030 Elevated blood-pressure reading, without diagnosis of hypertension: Secondary | ICD-10-CM

## 2021-12-27 LAB — POC URINALSYSI DIPSTICK (AUTOMATED)
Bilirubin, UA: NEGATIVE
Blood, UA: NEGATIVE
Glucose, UA: NEGATIVE
Ketones, UA: NEGATIVE
Leukocytes, UA: NEGATIVE
Nitrite, UA: NEGATIVE
Protein, UA: NEGATIVE
Spec Grav, UA: 1.01 (ref 1.010–1.025)
Urobilinogen, UA: 0.2 E.U./dL
pH, UA: 6 (ref 5.0–8.0)

## 2021-12-27 LAB — LIPID PANEL
Cholesterol: 266 mg/dL — ABNORMAL HIGH (ref 0–200)
HDL: 74.7 mg/dL (ref >39.00)
LDL Cholesterol: 163 mg/dL — ABNORMAL HIGH (ref 0–99)
NonHDL: 191.1
Total CHOL/HDL Ratio: 4
Triglycerides: 140 mg/dL (ref 0.0–149.0)
VLDL: 28 mg/dL (ref 0.0–40.0)

## 2021-12-27 LAB — TSH: TSH: 1.32 u[IU]/mL (ref 0.35–5.50)

## 2021-12-27 LAB — COMPREHENSIVE METABOLIC PANEL
ALT: 11 U/L (ref 0–35)
AST: 17 U/L (ref 0–37)
Albumin: 4.2 g/dL (ref 3.5–5.2)
Alkaline Phosphatase: 56 U/L (ref 39–117)
BUN: 15 mg/dL (ref 6–23)
CO2: 27 mEq/L (ref 19–32)
Calcium: 9.2 mg/dL (ref 8.4–10.5)
Chloride: 103 mEq/L (ref 96–112)
Creatinine, Ser: 0.8 mg/dL (ref 0.40–1.20)
GFR: 71.14 mL/min (ref >60.00)
Glucose, Bld: 89 mg/dL (ref 70–99)
Potassium: 4.3 mEq/L (ref 3.5–5.1)
Sodium: 141 mEq/L (ref 135–145)
Total Bilirubin: 0.4 mg/dL (ref 0.2–1.2)
Total Protein: 6.9 g/dL (ref 6.0–8.3)

## 2021-12-27 LAB — CBC WITH DIFFERENTIAL/PLATELET
Basophils Absolute: 0 10*3/uL (ref 0.0–0.1)
Basophils Relative: 0.6 % (ref 0.0–3.0)
Eosinophils Absolute: 0.1 10*3/uL (ref 0.0–0.7)
Eosinophils Relative: 1.6 % (ref 0.0–5.0)
HCT: 40.6 % (ref 36.0–46.0)
Hemoglobin: 13.4 g/dL (ref 12.0–15.0)
Lymphocytes Relative: 36.3 % (ref 12.0–46.0)
Lymphs Abs: 2.1 10*3/uL (ref 0.7–4.0)
MCHC: 32.9 g/dL (ref 30.0–36.0)
MCV: 90.9 fl (ref 78.0–100.0)
Monocytes Absolute: 0.5 10*3/uL (ref 0.1–1.0)
Monocytes Relative: 8.5 % (ref 3.0–12.0)
Neutro Abs: 3.1 10*3/uL (ref 1.4–7.7)
Neutrophils Relative %: 53 % (ref 43.0–77.0)
Platelets: 280 10*3/uL (ref 150.0–400.0)
RBC: 4.47 Mil/uL (ref 3.87–5.11)
RDW: 13.4 % (ref 11.5–15.5)
WBC: 5.9 10*3/uL (ref 4.0–10.5)

## 2021-12-27 LAB — HEMOGLOBIN A1C: Hgb A1c MFr Bld: 6.2 % (ref 4.6–6.5)

## 2021-12-27 NOTE — Patient Instructions (Addendum)
Team pelase give a calcium in diet handout. You want to target [phone] mg per day.   Could trial Pepcid over-the-counter medication twice daily for 1-2 days to see if its beneficial over current reflux medicine  Would encourage you to get a [facility]   Please try contacting [place] office to see the update on your Prolia shot.   [facility] contact Please call to schedule visit and/or procedure:  Address: [address], [place], [place] [address] Phone: [phone]  Please stop by lab before you go If you have mychart- we will send your results within 3 business days of [place] receiving them.  If you do not have mychart- we will call you about results within 5 business days of [place] receiving them.  *please also note that you will see labs on mychart as soon as they post. I will later go in and write notes on them- will say "notes from Dr. Yong Channel"  Recommended follow up: Return in about 1 year (around 12/27/2022) for physical or sooner if needed.

## 2021-12-31 ENCOUNTER — Ambulatory Visit: Payer: Medicare PPO | Admitting: Nurse Practitioner

## 2021-12-31 ENCOUNTER — Other Ambulatory Visit: Payer: Self-pay | Admitting: Nurse Practitioner

## 2021-12-31 ENCOUNTER — Encounter: Payer: Self-pay | Admitting: Nurse Practitioner

## 2021-12-31 VITALS — BP 110/60 | HR 56 | Ht 66.0 in | Wt 143.0 lb

## 2021-12-31 DIAGNOSIS — D126 Benign neoplasm of colon, unspecified: Secondary | ICD-10-CM | POA: Diagnosis not present

## 2021-12-31 MED ORDER — NA SULFATE-K SULFATE-MG SULF 17.5-3.13-1.6 GM/177ML PO SOLN: 1.0000 | ORAL | 0 refills | Status: DC

## 2021-12-31 NOTE — Patient Instructions (Signed)
COLONOSCOPY:   You have been scheduled for a colonoscopy. Please follow written instructions given to you at your visit today.   PREP:   Please pick up your prep supplies at the pharmacy within the next 1-3 days.  INHALERS:   If you use inhalers (even only as needed), please bring them with you on the day of your procedure.   COLONOSCOPY TIPS:  To reduce nausea and dehydration, stay well hydrated for 3-4 days prior to the exam.  To prevent skin/hemorrhoid irritation - prior to wiping, put A&Dointment or vaseline on the toilet paper. Keep a towel or pad on the bed.  BEFORE STARTING YOUR PREP, drink  64oz of clear liquids in the morning. This will help to flush the colon and will ensure you are well hydrated!!!!  NOTE - This is in addition to the fluids required for to complete your prep. Use of a flavored hard candy, such as grape Anise Salvo, can counteract some of the flavor of the prep and may prevent some nausea.   BMI:  If you are age 63 or older, your body mass index should be between 23-30. Your Body mass index is 23.08 kg/m. If this is out of the aforementioned range listed, please consider follow up with your Primary Care Provider.  If you are age 52 or younger, your body mass index should be between 19-25. Your Body mass index is 23.08 kg/m. If this is out of the aformentioned range listed, please consider follow up with your Primary Care Provider.   MY CHART:  The [facility] providers would like to encourage you to use [facility] to communicate with providers for non-urgent requests or questions.  Due to long hold times on the telephone, sending your provider a message by [facility] may be a faster and more efficient way to get a response.  Please allow 48 business hours for a response.  Please remember that this is for non-urgent requests.   Thank you for trusting me with your gastrointestinal care!    Noralyn Pick, CRNP

## 2021-12-31 NOTE — Progress Notes (Signed)
12/31/2021 CASTELLA LERNER [id number] 1945-12-20   CHIEF COMPLAINT: Schedule a colonoscopy  HISTORY OF PRESENT ILLNESS: Samanta Gal. Meroney is a 76 year old female with a past medical history of arthritis, hypercholesterolemia, breast cancer s/p lumpectomy with radiation 2006, thyroid nodules, Raynaud's disease, COVID infection 01/2021, GERD, diverticulosis and colon polyps.  She presents to our office today to schedule a colonoscopy.  Her most recent colonoscopy was 01/26/2017 which identified 1 7 mm tubular adenomatous polyp removed from the sigmoid colon.  She typically passes a normal formed brown bowel movement once daily or every other day after she drinks a cup of coffee.  She had 1 episode of diarrhea with incontinence 12/2020 without recurrence.  However, she sometimes feels mild urgency prior to passing a bowel movement.  No rectal bleeding or black stools.  She develops abdominal bloat if she eats a high-fiber diet.  Maternal grandmother died from rectal cancer in her late 18s.  She has a history of GERD which is fairly well controlled on Esomeprazole 20 mg daily.  She has occasional belching.  No heartburn or dysphagia.  No upper abdominal pain.  No other complaints at this time.   CBC Latest Ref Rng & Units 12/27/2021 12/21/2020 12/18/2019  WBC 4.0 - 10.5 K/uL 5.9 7.2 7.0  Hemoglobin 12.0 - 15.0 g/dL 13.4 14.5 14.8  Hematocrit 36.0 - 46.0 % 40.6 42.8 44.9  Platelets 150.0 - 400.0 K/uL 280.0 306.0 325.0    CMP Latest Ref Rng & Units 12/27/2021 08/27/2021 08/27/2021  Glucose 70 - 99 mg/dL 89 90 90  BUN 6 - 23 mg/dL 15 16 16   Creatinine 0.40 - 1.20 mg/dL 0.80 0.80 0.76  Sodium 135 - 145 mEq/L 141 143 143  Potassium 3.5 - 5.1 mEq/L 4.3 5.4(H) 4.9  Chloride 96 - 112 mEq/L 103 102 102  CO2 19 - 32 mEq/L 27 24 25   Calcium 8.4 - 10.5 mg/dL 9.2 10.2 9.8  Total Protein 6.0 - 8.3 g/dL 6.9 - -  Total Bilirubin 0.2 - 1.2 mg/dL 0.4 - -  Alkaline Phos 39 - 117 U/L 56 - -  AST 0 - 37 U/L 17 - -   ALT 0 - 35 U/L 11 - -    Colonoscopy 01/26/2017: - One 7 mm polyp in the sigmoid colon, removed with a cold snare. Resected and retrieved. - Non-bleeding internal hemorrhoids. - The examination was otherwise normal. Surgical [P], sigmoid, polyp - TUBULAR ADENOMA (X 1). - NO HIGH GRADE DYSPLASIA OR MALIGNANCY IDENTIFIED  Past Medical History:  Diagnosis Date   Basal cell carcinoma of skin    left ankle, right thigh   Bursitis    right hip   Cancer ([place]) 2006   Breast/right   COVID 01/27/2021   Diverticulosis    GERD (gastroesophageal reflux disease)    History of breast cancer/33 radiation treatments/ no chemo    2006  S/P RIGHT LUMPECTOMY AND RADIATION FOLLOWED BY DR RUBIN--  NO RECURRENCE   History of kidney stones    Hyperplastic colon polyp    IBS (irritable bowel syndrome)    Idiopathic peripheral neuropathy    bilateral tingling/ due to fall in [facility]   Internal hemorrhoid    Kidney stone    Leiomyoma    Multiple small   Liver cyst 2006   Benign   OA (osteoarthritis)    Osteoporosis 2019   T score -2.5   Personal history of radiation therapy    Raynaud disease  Renal calculus, left    non obstructive   Rib pain on left side    able to lay on left side   Rosacea    Tinnitus    Past Surgical History:  Procedure Laterality Date   BREAST BIOPSY Left 2016   benign   BREAST EXCISIONAL BIOPSY Right 1973   benign   BREAST LUMPECTOMY  2006    Right -- S/P RADIATION--  NO RECURRENCE   DILATION AND CURETTAGE OF UTERUS     EXTRACORPOREAL SHOCK WAVE LITHOTRIPSY     X2   HYSTEROSCOPY     HYSTEROSCOPY WITH D & C  08/03/2012   Procedure: DILATATION AND CURETTAGE /HYSTEROSCOPY;  Surgeon: Anastasio Auerbach, MD;  Location: [place];  Service: Gynecology;  Laterality: N/A;   SUPRANUMERARY NIPPLE EXCISION  1973   CYST   TONSILLECTOMY  AGE 72   Social History: She is married.  She has 1 daughter.  She is a retired Pharmacist, hospital.  Non-smoker.  She drinks 1  alcoholic beverage a daily or less.    Family History: Maternal grandmother with rectal cancer.  Father with history of CVA and MI.  Mother with depression, dementia and osteoporosis.  Sister with a CVA  Allergies  Allergen Reactions   Amoxicillin Diarrhea    GI upset      Outpatient Encounter Medications as of 12/31/2021  Medication Sig   acetaminophen (TYLENOL) 500 MG tablet Take 1,000 mg by mouth 3 (three) times daily as needed.   Cholecalciferol (VITAMIN D PO) Take by mouth.   Cyanocobalamin (VITAMIN B 12 PO) Take by mouth.   denosumab (PROLIA) 60 MG/ML SOSY injection Inject 60 mg into the skin every 6 (six) months.   esomeprazole (NEXIUM) 20 MG capsule TAKE ONE CAPSULE BY MOUTH EVERY DAY AT NOON   No facility-administered encounter medications on file as of 12/31/2021.    REVIEW OF SYSTEMS:  Gen: Denies fever, sweats or chills. No weight loss.  CV: Denies chest pain, palpitations or edema. Resp: Denies cough, shortness of breath of hemoptysis.  GI: See HPI GU : Denies urinary burning, blood in urine, increased urinary frequency or incontinence. MS: + Scoliosis.  Back pain. Derm: Denies rash, itchiness, skin lesions or unhealing ulcers. Psych: Denies depression, anxiety, memory loss, suicidal ideation and confusion. Heme: Denies bruising, easy bleeding. Neuro:  Denies headaches, dizziness or paresthesias. Endo:  + Thyroid nodules.   PHYSICAL EXAM: BP 110/60    Pulse (!) 56    Ht 5\' 6"  (1.676 m)    Wt 143 lb (64.9 kg)    LMP  (LMP Unknown)    BMI 23.08 kg/m  General: 76 year old female in no acute distress. Head: Normocephalic and atraumatic. Eyes:  Sclerae non-icteric, conjunctive pink. Ears: Normal auditory acuity. Mouth: Dentition intact. No ulcers or lesions.  Neck: Supple, no lymphadenopathy.  Small thyroid nodules bilaterally. Lungs: Clear bilaterally to auscultation without wheezes, crackles or rhonchi. Heart: Regular rate and rhythm. No murmur, rub or gallop  appreciated.  Abdomen: Soft, nontender, non distended. No masses. No hepatosplenomegaly. Normoactive bowel sounds x 4 quadrants.  Rectal: Deferred. Musculoskeletal: Symmetrical with no gross deformities. Skin: Warm and dry. No rash or lesions on visible extremities. Extremities: No edema. Neurological: Alert oriented x 4, no focal deficits.  Psychological:  Alert and cooperative. Normal mood and affect.  ASSESSMENT AND PLAN:  74) 76 year old female with a history of colon polyps.  A 7 mm tubular adenomatous polyp was removed from the sigmoid colon per  colonoscopy 01/26/2017.  No grandmother with history of rectal cancer. -Colonoscopy benefits and risks discussed including risk with sedation, risk of bleeding, perforation and infection  -Further recommendations to be determined after colonoscopy completed  2) GERD, no heartburn, occasional burping  -Continue Esomeprazole 20 mg daily -Patient to contact office if GERD symptoms worsen       CC:  Marin Olp, MD

## 2022-01-04 NOTE — Telephone Encounter (Signed)
This prep will need new instructions. I will have to work on it tomorrow afternoon when I have desk time

## 2022-01-05 ENCOUNTER — Telehealth: Payer: Self-pay

## 2022-01-05 DIAGNOSIS — C44712 Basal cell carcinoma of skin of right lower limb, including hip: Secondary | ICD-10-CM | POA: Diagnosis not present

## 2022-01-05 DIAGNOSIS — Z85828 Personal history of other malignant neoplasm of skin: Secondary | ICD-10-CM | POA: Diagnosis not present

## 2022-01-05 NOTE — Telephone Encounter (Signed)
Prep ordered

## 2022-01-12 ENCOUNTER — Other Ambulatory Visit: Payer: Self-pay

## 2022-01-12 ENCOUNTER — Ambulatory Visit (INDEPENDENT_AMBULATORY_CARE_PROVIDER_SITE_OTHER): Payer: Medicare PPO

## 2022-01-12 DIAGNOSIS — M818 Other osteoporosis without current pathological fracture: Secondary | ICD-10-CM

## 2022-01-12 MED ORDER — DENOSUMAB 60 MG/ML ~~LOC~~ SOSY
60.0000 mg | PREFILLED_SYRINGE | Freq: Once | SUBCUTANEOUS | Status: AC
  Administered 2022-01-12: 60 mg via SUBCUTANEOUS

## 2022-01-12 NOTE — Progress Notes (Signed)
Pt verbally consented to Prolia injection which was administered to pt's left arm. Pt tolerated well.

## 2022-01-19 NOTE — Telephone Encounter (Signed)
Last Prolia inj 01/12/22 ?Next Prolia inj due 07/13/22 ?

## 2022-01-19 NOTE — Progress Notes (Signed)
Reviewed and agree with documentation and assessment and plan. K. Veena Amylynn Fano , MD   

## 2022-02-07 ENCOUNTER — Ambulatory Visit: Payer: Medicare PPO

## 2022-02-11 ENCOUNTER — Encounter: Payer: Self-pay | Admitting: Gastroenterology

## 2022-02-17 ENCOUNTER — Other Ambulatory Visit: Payer: Self-pay

## 2022-02-17 ENCOUNTER — Ambulatory Visit (AMBULATORY_SURGERY_CENTER): Payer: Medicare PPO | Admitting: Gastroenterology

## 2022-02-17 ENCOUNTER — Encounter: Payer: Self-pay | Admitting: Gastroenterology

## 2022-02-17 VITALS — BP 130/68 | HR 91 | Temp 97.8°F | Resp 16 | Ht 66.0 in | Wt 143.0 lb

## 2022-02-17 DIAGNOSIS — Z800 Family history of malignant neoplasm of digestive organs: Secondary | ICD-10-CM

## 2022-02-17 DIAGNOSIS — Z86010 Personal history of colonic polyps: Secondary | ICD-10-CM

## 2022-02-17 DIAGNOSIS — D128 Benign neoplasm of rectum: Secondary | ICD-10-CM | POA: Diagnosis not present

## 2022-02-17 DIAGNOSIS — K621 Rectal polyp: Secondary | ICD-10-CM | POA: Diagnosis not present

## 2022-02-17 MED ORDER — SODIUM CHLORIDE 0.9 % IV SOLN: 500.0000 mL | Freq: Once | INTRAVENOUS | Status: DC

## 2022-02-17 NOTE — Patient Instructions (Signed)
Resume previous diet and current medications.  Handout given for polyps and diverticulosis.  Await pathology results.  No repeat colonoscopy because of age.  ? ?YOU HAD AN ENDOSCOPIC PROCEDURE TODAY AT [facility]:   Refer to the procedure report that was given to you for any specific questions about what was found during the examination.  If the procedure report does not answer your questions, please call your gastroenterologist to clarify.  If you requested that your care partner not be given the details of your procedure findings, then the procedure report has been included in a sealed envelope for you to review at your convenience later. ? ?YOU SHOULD EXPECT: Some feelings of bloating in the abdomen. Passage of more gas than usual.  Walking can help get rid of the air that was put into your GI tract during the procedure and reduce the bloating. If you had a lower endoscopy (such as a colonoscopy or flexible sigmoidoscopy) you may notice spotting of blood in your stool or on the toilet paper. If you underwent a bowel prep for your procedure, you may not have a normal bowel movement for a few days. ? ?Please Note:  You might notice some irritation and congestion in your nose or some drainage.  This is from the oxygen used during your procedure.  There is no need for concern and it should clear up in a day or so. ? ?SYMPTOMS TO REPORT IMMEDIATELY: ? ?Following lower endoscopy (colonoscopy or flexible sigmoidoscopy): ? Excessive amounts of blood in the stool ? Significant tenderness or worsening of abdominal pains ? Swelling of the abdomen that is new, acute ? Fever of 100?F or higher ? ? ? ?For urgent or emergent issues, a gastroenterologist can be reached at any hour by calling [phone]. ?Do not use MyChart messaging for urgent concerns.  ? ? ?DIET:  We do recommend a small meal at first, but then you may proceed to your regular diet.  Drink plenty of fluids but you should avoid alcoholic  beverages for 24 hours. ? ?ACTIVITY:  You should plan to take it easy for the rest of today and you should NOT DRIVE or use heavy machinery until tomorrow (because of the sedation medicines used during the test).   ? ?FOLLOW UP: ?Our staff will call the number listed on your records 48-72 hours following your procedure to check on you and address any questions or concerns that you may have regarding the information given to you following your procedure. If we do not reach you, we will leave a message.  We will attempt to reach you two times.  During this call, we will ask if you have developed any symptoms of COVID 19. If you develop any symptoms (ie: fever, flu-like symptoms, shortness of breath, cough etc.) before then, please call [phone].  If you test positive for Covid 19 in the 2 weeks post procedure, please call and report this information to [place].   ? ?If any biopsies were taken you will be contacted by phone or by letter within the next 1-3 weeks.  Please call us at [phone] if you have not heard about the biopsies in 3 weeks.  ? ? ?SIGNATURES/CONFIDENTIALITY: ?You and/or your care partner have signed paperwork which will be entered into your electronic medical record.  These signatures attest to the fact that that the information above on your After Visit Summary has been reviewed and is understood.  Full responsibility of the confidentiality of this  discharge information lies with you and/or your care-partner.  ?

## 2022-02-17 NOTE — Progress Notes (Signed)
Report given to PACU, vss 

## 2022-02-17 NOTE — Progress Notes (Signed)
Called to room to assist during endoscopic procedure.  Patient ID and intended procedure confirmed with present staff. Received instructions for my participation in the procedure from the performing physician.  

## 2022-02-17 NOTE — Progress Notes (Signed)
VS  DT ? ?Pt's states no medical or surgical changes since previsit or office visit. ? ?

## 2022-02-17 NOTE — Progress Notes (Signed)
[name] ? ? ?Primary Care Physician:  Marin Olp, MD ? ? ?Reason for Procedure:  History of adenomatous colon polyps and family h/o colon cancer ? ?Plan:    Surveillance colonoscopy with possible interventions as needed ? ? ? ? ?HPI: Dana Chambers is a very pleasant 76 y.o. female here for surveillance colonoscopy. ?Denies any nausea, vomiting, abdominal pain, melena or bright red blood per rectum ? ?The risks and benefits as well as alternatives of endoscopic procedure(s) have been discussed and reviewed. All questions answered. The patient agrees to proceed. ? ? ? ?Past Medical History:  ?Diagnosis Date  ? Basal cell carcinoma of skin   ? left ankle, right thigh  ? Bursitis   ? right hip  ? Cancer [facility]) 2006  ? Breast/right  ? COVID 01/27/2021  ? Diverticulosis   ? GERD (gastroesophageal reflux disease)   ? History of breast cancer/33 radiation treatments/ no chemo   ? 2006  S/P RIGHT LUMPECTOMY AND RADIATION FOLLOWED BY DR RUBIN--  NO RECURRENCE  ? History of kidney stones   ? Hyperplastic colon polyp   ? IBS (irritable bowel syndrome)   ? Idiopathic peripheral neuropathy   ? bilateral tingling/ due to fall in [place]  ? Internal hemorrhoid   ? Kidney stone   ? Leiomyoma   ? Multiple small  ? Liver cyst 2006  ? Benign  ? OA (osteoarthritis)   ? Osteoporosis 2019  ? T score -2.5  ? Personal history of radiation therapy   ? Raynaud disease   ? Renal calculus, left   ? non obstructive  ? Rib pain on left side   ? able to lay on left side  ? Rosacea   ? Tinnitus   ? ? ?Past Surgical History:  ?Procedure Laterality Date  ? BREAST BIOPSY Left 2016  ? benign  ? BREAST EXCISIONAL BIOPSY Right 1973  ? benign  ? BREAST LUMPECTOMY  2006   ? Right -- S/P RADIATION--  NO RECURRENCE  ? DILATION AND CURETTAGE OF UTERUS    ? EXTRACORPOREAL SHOCK WAVE LITHOTRIPSY    ? X2  ? HYSTEROSCOPY    ? HYSTEROSCOPY WITH D & C  08/03/2012  ? Procedure: DILATATION AND CURETTAGE /HYSTEROSCOPY;   Surgeon: Anastasio Auerbach, MD;  Location: [facility];  Service: Gynecology;  Laterality: N/A;  ? [place]  ? CYST  ? TONSILLECTOMY  AGE 25  ? ? ?Prior to Admission medications   ?Medication Sig Start Date End Date Taking? Authorizing Provider  ?Cholecalciferol (VITAMIN D PO) Take by mouth.   Yes [provider]  ?Cyanocobalamin (VITAMIN B 12 PO) Take by mouth.   Yes [provider]  ?esomeprazole (NEXIUM) 20 MG capsule TAKE ONE CAPSULE BY MOUTH EVERY DAY AT NOON 12/10/21  Yes Marin Olp, MD  ?acetaminophen (TYLENOL) 500 MG tablet Take 1,000 mg by mouth 3 (three) times daily as needed.    [provider]  ?denosumab (PROLIA) 60 MG/ML SOSY injection Inject 60 mg into the skin every 6 (six) months.    [provider]  ? ? ?Current Outpatient Medications  ?Medication Sig Dispense Refill  ? Cholecalciferol (VITAMIN D PO) Take by mouth.    ? Cyanocobalamin (VITAMIN B 12 PO) Take by mouth.    ? esomeprazole (NEXIUM) 20 MG capsule TAKE ONE CAPSULE BY MOUTH EVERY DAY AT NOON 90 capsule 3  ? acetaminophen (TYLENOL) 500 MG tablet Take  1,000 mg by mouth 3 (three) times daily as needed.    ? denosumab (PROLIA) 60 MG/ML SOSY injection Inject 60 mg into the skin every 6 (six) months.    ? ?Current Facility-Administered Medications  ?Medication Dose Route Frequency Provider Last Rate Last Admin  ? 0.9 %  sodium chloride infusion  500 mL Intravenous Once Keundra Petrucelli, Venia Minks, MD      ? ? ?Allergies as of 02/17/2022 - Review Complete 02/17/2022  ?Allergen Reaction Noted  ? Amoxicillin Diarrhea 03/06/2012  ? ? ?Family History  ?Problem Relation Age of Onset  ? Dementia Mother   ? Depression Mother   ? Osteoporosis Mother   ? Stroke Father   ? Heart attack Father 63  ? Rectal cancer Maternal Grandmother   ? Colon cancer Maternal Grandmother   ? Stroke Sister   ?     TIA. ? dementia. another stroke after covid then feeding issues led to eath  ?  Dementia Sister   ? Breast cancer Neg Hx   ? ? ?Social History  ? ?Socioeconomic History  ? Marital status: Married  ?  Spouse name: Not on file  ? Number of children: 1  ? Years of education: Not on file  ? Highest education level: Not on file  ?Occupational History  ? Occupation: Firefighter  ?Tobacco Use  ? Smoking status: Never  ? Smokeless tobacco: Never  ?Vaping Use  ? Vaping Use: Never used  ?Substance and Sexual Activity  ? Alcohol use: Yes  ?  Alcohol/week: 7.0 standard drinks  ?  Types: 7 Standard drinks or equivalent per week  ? Drug use: No  ? Sexual activity: Not Currently  ?  Birth control/protection: Post-menopausal  ?  Comment: 1st intercourse 76 yo-Fewer than 5 partners  ?Other Topics Concern  ? Not on file  ?Social History Narrative  ? Married (husband patient of Dr. Yong Channel). 1 daughter. 1 granddaughter.   ?   ? 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   ?   ? Hobbies: reading, travel, time with granddaughter. Very active  ? ?Social Determinants of Health  ? ?Financial Resource Strain: Not on file  ?Food Insecurity: Not on file  ?Transportation Needs: Not on file  ?Physical Activity: Not on file  ?Stress: Not on file  ?Social Connections: Not on file  ?Intimate Partner Violence: Not on file  ? ? ?Review of Systems: ? ?All other review of systems negative except as mentioned in the HPI. ? ?Physical Exam: ?Vital signs in last 24 hours: ?BP 120/74   Pulse 90   Temp 97.8 ?F (36.6 ?C)   Ht '5\' 6"'$  (1.676 m)   Wt 143 lb (64.9 kg)   LMP  (LMP Unknown)   SpO2 100%   BMI 23.08 kg/m?  ?General:   Alert, NAD ?Lungs:  Clear .   ?Heart:  Regular rate and rhythm ?Abdomen:  Soft, nontender and nondistended. ?Neuro/Psych:  Alert and cooperative. Normal mood and affect. A and O x 3 ? ?Reviewed labs, radiology imaging, old records and pertinent past GI work up ? ?Patient is appropriate for planned procedure(s) and anesthesia in an ambulatory setting ? ? ?K. Denzil Magnuson , MD ?[phone]  ? ? ?  ?

## 2022-02-17 NOTE — Op Note (Signed)
[place] ?Patient Name: Dana Chambers ?Procedure Date: 02/17/2022 8:40 AM ?MRN: [id number] ?Endoscopist: Mauri Pole , MD ?Age: 76 ?Referring MD:  ?Date of Birth: May 20, 1946 ?Gender: Female ?Account #: [id number] ?Procedure:                Colonoscopy ?Indications:              Screening in patient at increased risk: Family  ?                          history of 1st-degree relative with colorectal  ?                          cancer, High risk colon cancer surveillance:  ?                          Personal history of colonic polyps ?Medicines:                Monitored Anesthesia Care ?Procedure:                Pre-Anesthesia Assessment: ?                          - Prior to the procedure, a History and Physical  ?                          was performed, and patient medications and  ?                          allergies were reviewed. The patient's tolerance of  ?                          previous anesthesia was also reviewed. The risks  ?                          and benefits of the procedure and the sedation  ?                          options and risks were discussed with the patient.  ?                          All questions were answered, and informed consent  ?                          was obtained. Prior Anticoagulants: The patient has  ?                          taken no previous anticoagulant or antiplatelet  ?                          agents. ASA Grade Assessment: II - A patient with  ?                          mild systemic disease. After reviewing the risks  ?  and benefits, the patient was deemed in  ?                          satisfactory condition to undergo the procedure. ?                          After obtaining informed consent, the colonoscope  ?                          was passed under direct vision. Throughout the  ?                          procedure, the patient's blood pressure, pulse, and  ?                          oxygen saturations were  monitored continuously. The  ?                          Olympus PCF-H190DL (#[id number]) Colonoscope was  ?                          introduced through the anus and advanced to the the  ?                          cecum, identified by appendiceal orifice and  ?                          ileocecal valve. The colonoscopy was performed  ?                          without difficulty. The patient tolerated the  ?                          procedure well. The quality of the bowel  ?                          preparation was excellent. The ileocecal valve,  ?                          appendiceal orifice, and rectum were photographed. ?Scope In: 8:48:37 AM ?Scope Out: 9:10:54 AM ?Scope Withdrawal Time: 0 hours 12 minutes 39 seconds  ?Total Procedure Duration: 0 hours 22 minutes 17 seconds  ?Findings:                 The perianal and digital rectal examinations were  ?                          normal. ?                          A 3 mm polyp was found in the rectum. The polyp was  ?                          sessile. The polyp was removed with a cold snare.  ?  Resection and retrieval were complete. ?                          A patchy area of mildly friable mucosa with contact  ?                          bleeding was found in the transverse colon, in the  ?                          ascending colon and in the cecum. Likely mucosal  ?                          trauma from bowel prep or mild ischemic colitis ?                          Scattered small-mouthed diverticula were found in  ?                          the sigmoid colon. ?                          Non-bleeding external and internal hemorrhoids were  ?                          found during retroflexion. The hemorrhoids were  ?                          medium-sized. ?                          The exam was otherwise without abnormality. ?Complications:            No immediate complications. ?Estimated Blood Loss:     Estimated blood loss was  minimal. ?Impression:               - One 3 mm polyp in the rectum, removed with a cold  ?                          snare. Resected and retrieved. ?                          - Friability with contact bleeding in the  ?                          transverse colon, in the ascending colon and in the  ?                          cecum. ?                          - Diverticulosis in the sigmoid colon. ?                          - Non-bleeding external and internal hemorrhoids. ?                          -  The examination was otherwise normal. ?Recommendation:           - Patient has a contact number available for  ?                          emergencies. The signs and symptoms of potential  ?                          delayed complications were discussed with the  ?                          patient. Return to normal activities tomorrow.  ?                          Written discharge instructions were provided to the  ?                          patient. ?                          - Resume previous diet. ?                          - Continue present medications. ?                          - Await pathology results. ?                          - No repeat colonoscopy due to age. ?                          - Return to GI clinic PRN. ?Mauri Pole, MD ?02/17/2022 9:18:19 AM ?This report has been signed electronically. ?

## 2022-02-21 ENCOUNTER — Ambulatory Visit (INDEPENDENT_AMBULATORY_CARE_PROVIDER_SITE_OTHER): Payer: Medicare PPO

## 2022-02-21 ENCOUNTER — Telehealth: Payer: Self-pay | Admitting: *Deleted

## 2022-02-21 VITALS — BP 114/64 | HR 84 | Temp 98.7°F | Wt 142.4 lb

## 2022-02-21 DIAGNOSIS — Z0000 Encounter for general adult medical examination without abnormal findings: Secondary | ICD-10-CM

## 2022-02-21 NOTE — Telephone Encounter (Signed)
?  Follow up Call- ? ? ?  02/17/2022  ?  8:05 AM  ?Call back number  ?Post procedure Call Back phone  # [phone]  ?Permission to leave phone message Yes  ?  ? ?Patient questions: ? ?Do you have a fever, pain , or abdominal swelling? No. ?Pain Score  0 * ? ?Have you tolerated food without any problems? Yes.   ? ?Have you been able to return to your normal activities? Yes.   ? ?Do you have any questions about your discharge instructions: ?Diet   No. ?Medications  No. ?Follow up visit  No. ? ?Do you have questions or concerns about your Care? No. ? ?Actions: ?* If pain score is 4 or above: ?No action needed, pain <4. ? ? ?

## 2022-02-21 NOTE — Progress Notes (Signed)
? ?Subjective:  ? Dana Chambers is a 76 y.o. female who presents for Medicare Annual (Subsequent) preventive examination. ? ?Review of Systems    ? ?Cardiac Risk Factors include: advanced age (>32mn, >>46women);dyslipidemia ? ?   ?Objective:  ?  ?Today's Vitals  ? 02/21/22 0829  ?BP: 114/64  ?Pulse: 84  ?Temp: 98.7 ?F (37.1 ?C)  ?SpO2: 99%  ?Weight: 142 lb 6.4 oz (64.6 kg)  ? ?Body mass index is 22.98 kg/m?. ? ? ?  02/21/2022  ?  8:47 AM 02/21/2021  ? 10:01 PM 02/09/2021  ?  1:31 PM 01/28/2021  ?  8:18 AM 06/22/2018  ?  1:35 PM 08/10/2017  ?  8:14 AM 06/07/2017  ?  9:38 AM  ?Advanced Directives  ?Does Patient Have a Medical Advance Directive? Yes Yes Yes Yes Yes Yes Yes  ?Type of AIndustrial/product designerof [place] will  ?Does patient want to make changes to medical advance directive?       Yes (MAU/Ambulatory/Procedural Areas - Information given)  ?Copy of HClif[place]in Chart? Yes - validated most recent copy scanned in chart (See row information) Yes - validated most recent copy scanned in chart (See row information) Yes - validated most recent copy scanned in chart (See row information) Yes - validated most recent copy scanned in chart (See row information)   No - copy requested  ? ? ?Current Medications (verified) ?Outpatient Encounter Medications as of 02/21/2022  ?Medication Sig  ? acetaminophen (TYLENOL) 500 MG tablet Take 1,000 mg by mouth 3 (three) times daily as needed.  ? Cholecalciferol (VITAMIN D PO) Take by mouth.  ? Cyanocobalamin (VITAMIN B 12 PO) Take by mouth.  ? denosumab (PROLIA) 60 MG/ML SOSY injection Inject 60 mg into the skin every 6 (six) months.  ? esomeprazole (NEXIUM) 20 MG capsule TAKE ONE CAPSULE BY MOUTH EVERY DAY AT NOON  ? ?No facility-administered encounter medications on file as of 02/21/2022.  ? ? ?Allergies  (verified) ?Amoxicillin  ? ?History: ?Past Medical History:  ?Diagnosis Date  ? Basal cell carcinoma of skin   ? left ankle, right thigh  ? Bursitis   ? right hip  ? Cancer ([facility] 2006  ? Breast/right  ? COVID 01/27/2021  ? Diverticulosis   ? GERD (gastroesophageal reflux disease)   ? History of breast cancer/33 radiation treatments/ no chemo   ? 2006  S/P RIGHT LUMPECTOMY AND RADIATION FOLLOWED BY DR RUBIN--  NO RECURRENCE  ? History of kidney stones   ? Hyperplastic colon polyp   ? IBS (irritable bowel syndrome)   ? Idiopathic peripheral neuropathy   ? bilateral tingling/ due to fall in [place]  ? Internal hemorrhoid   ? Kidney stone   ? Leiomyoma   ? Multiple small  ? Liver cyst 2006  ? Benign  ? OA (osteoarthritis)   ? Osteoporosis 2019  ? T score -2.5  ? Personal history of radiation therapy   ? Raynaud disease   ? Renal calculus, left   ? non obstructive  ? Rib pain on left side   ? able to lay on left side  ? Rosacea   ? Tinnitus   ? ?Past Surgical History:  ?Procedure Laterality Date  ? BREAST BIOPSY Left 2016  ? benign  ? BREAST EXCISIONAL BIOPSY Right 1973  ? benign  ? BREAST LUMPECTOMY  2006   ? Right -- S/P RADIATION--  NO RECURRENCE  ? DILATION AND CURETTAGE OF UTERUS    ? EXTRACORPOREAL SHOCK WAVE LITHOTRIPSY    ? X2  ? HYSTEROSCOPY    ? HYSTEROSCOPY WITH D & C  08/03/2012  ? Procedure: DILATATION AND CURETTAGE /HYSTEROSCOPY;  Surgeon: Anastasio Auerbach, MD;  Location: [facility];  Service: Gynecology;  Laterality: N/A;  ? [place]  ? CYST  ? TONSILLECTOMY  AGE 62  ? ?Family History  ?Problem Relation Age of Onset  ? Dementia Mother   ? Depression Mother   ? Osteoporosis Mother   ? Stroke Father   ? Heart attack Father 19  ? Rectal cancer Maternal Grandmother   ? Colon cancer Maternal Grandmother   ? Stroke Sister   ?     TIA. ? dementia. another stroke after covid then feeding issues led to eath  ? Dementia Sister   ? Breast cancer Neg Hx   ? ?Social History   ? ?Socioeconomic History  ? Marital status: Married  ?  Spouse name: Not on file  ? Number of children: 1  ? Years of education: Not on file  ? Highest education level: Not on file  ?Occupational History  ? Occupation: Firefighter  ?Tobacco Use  ? Smoking status: Never  ? Smokeless tobacco: Never  ?Vaping Use  ? Vaping Use: Never used  ?Substance and Sexual Activity  ? Alcohol use: Yes  ?  Alcohol/week: 7.0 standard drinks  ?  Types: 7 Standard drinks or equivalent per week  ? Drug use: No  ? Sexual activity: Not Currently  ?  Birth control/protection: Post-menopausal  ?  Comment: 1st intercourse 76 yo-Fewer than 5 partners  ?Other Topics Concern  ? Not on file  ?Social History Narrative  ? Married (husband patient of Dr. Yong Channel). 1 daughter. 1 granddaughter.   ?   ? 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   ?   ? Hobbies: reading, travel, time with granddaughter. Very active  ? ?Social Determinants of Health  ? ?Financial Resource Strain: Low Risk   ? Difficulty of Paying Living Expenses: Not hard at all  ?Food Insecurity: No Food Insecurity  ? Worried About Charity fundraiser in the Last Year: Never true  ? Ran Out of Food in the Last Year: Never true  ?Transportation Needs: No Transportation Needs  ? Lack of Transportation (Medical): No  ? Lack of Transportation (Non-Medical): No  ?Physical Activity: Insufficiently Active  ? Days of Exercise per Week: 2 days  ? Minutes of Exercise per Session: 60 min  ?Stress: No Stress Concern Present  ? Feeling of Stress : Not at all  ?Social Connections: Socially Integrated  ? Frequency of Communication with Friends and Family: More than three times a week  ? Frequency of Social Gatherings with Friends and Family: Twice a week  ? Attends Religious Services: More than 4 times per year  ? Active Member of Clubs or Organizations: Yes  ? Attends Archivist Meetings: 1 to 4 times per year  ? Marital Status: Married  ? ? ?Tobacco Counseling ?Counseling given: Not  Answered ? ? ?Clinical Intake: ? ?Pre-visit preparation completed: Yes ? ?Pain : No/denies pain (but do have stiffness) ? ?  ? ?BMI - recorded: 22.98 ?Nutritional Status: BMI of 19-24  Normal ?Nutritional Risks: None ?Diabetes: No ? ?How often do you need to have someone help you when you read instructions,  pamphlets, or other written materials from your doctor or pharmacy?: 1 - Never ? ?Diabetic?No ? ?Interpreter Needed?: No ? ?Information entered by :: Charlott Rakes, LPN ? ? ?Activities of Daily Living ? ?  02/21/2022  ?  8:48 AM  ?In your present state of health, do you have any difficulty performing the following activities:  ?Hearing? 1  ?Comment tinnitus  ?Vision? 0  ?Difficulty concentrating or making decisions? 0  ?Walking or climbing stairs? 0  ?Dressing or bathing? 0  ?Doing errands, shopping? 0  ?Preparing Food and eating ? N  ?Using the Toilet? N  ?In the past six months, have you accidently leaked urine? Y  ?Comment at times  ?Do you have problems with loss of bowel control? Y  ?Comment leaking at titmes wears a brief at times as well  ?Managing your Medications? N  ?Managing your Finances? N  ?Housekeeping or managing your Housekeeping? N  ? ? ?Patient Care Team: ?Marin Olp, MD as PCP - General (Family Medicine) ?Rolm Bookbinder, MD as Consulting Physician (Dermatology) ?Philemon Kingdom, MD as Consulting Physician (Endocrinology) ?Fontaine, Belinda Block, MD (Inactive) as Consulting Physician (Gynecology) ?Mauri Pole, MD as Consulting Physician (Gastroenterology) ?Monna Fam, MD as Consulting Physician (Ophthalmology) ? ?Indicate any recent Medical Services you may have received from other than [facility] providers in the past year (date may be approximate). ? ?   ?Assessment:  ? This is a routine wellness examination for [facility]. ? ?Hearing/Vision screen ?Hearing Screening - Comments:: Pt has tinnitus and miss some words at times  ?Vision Screening - Comments:: Pt follow up with Dr [name]   for annual eye exams  ? ?Dietary issues and exercise activities discussed: ?Current Exercise Habits: Structured exercise class, Type of exercise: Other - see comments;walking (water exercise), Time (Minutes): 60, Frequency (Times

## 2022-02-21 NOTE — Patient Instructions (Signed)
Dana Chambers , ?Thank you for taking time to come for your Medicare Wellness Visit. I appreciate your ongoing commitment to your health goals. Please review the following plan we discussed and let me know if I can assist you in the future.  ? ?Screening recommendations/referrals: ?Colonoscopy: Done 02/17/22 repeat every  5 years  ?Mammogram: Done 09/24/21 repeat every year  ?Bone Density: Done 11/06/20 repeat every 2 years  ?Recommended yearly ophthalmology/optometry visit for glaucoma screening and checkup ?Recommended yearly dental visit for hygiene and checkup ? ?Vaccinations: ?Influenza vaccine: Done 07/21/21 repeat every year  ?Pneumococcal vaccine: Up to date ?Tdap vaccine: Done 03/06/12 repeat every 10 years Due 03/06/22 ?Shingles vaccine: Completed 4/13, 05/10/18   ?Covid-19:Completed 1/25, 2/15, 07/31/20 & 5/12, 08/11/21 ? ?Advanced directives: Copies in chart  ? ?Conditions/risks identified: eat better and increase exercise  ? ?Next appointment: Follow up in one year for your annual wellness visit  ? ? ?Preventive Care 78 Years and Older, Female ?Preventive care refers to lifestyle choices and visits with your health care provider that can promote health and wellness. ?What does preventive care include? ?A yearly physical exam. This is also called an annual well check. ?Dental exams once or twice a year. ?Routine eye exams. Ask your health care provider how often you should have your eyes checked. ?Personal lifestyle choices, including: ?Daily care of your teeth and gums. ?Regular physical activity. ?Eating a healthy diet. ?Avoiding tobacco and drug use. ?Limiting alcohol use. ?Practicing safe sex. ?Taking low-dose aspirin every day. ?Taking vitamin and mineral supplements as recommended by your health care provider. ?What happens during an annual well check? ?The services and screenings done by your health care provider during your annual well check will depend on your age, overall health, lifestyle risk factors,  and family history of disease. ?Counseling  ?Your health care provider may ask you questions about your: ?Alcohol use. ?Tobacco use. ?Drug use. ?Emotional well-being. ?Home and relationship well-being. ?Sexual activity. ?Eating habits. ?History of falls. ?Memory and ability to understand (cognition). ?Work and work Statistician. ?Reproductive health. ?Screening  ?You may have the following tests or measurements: ?Height, weight, and BMI. ?Blood pressure. ?Lipid and cholesterol levels. These may be checked every 5 years, or more frequently if you are over 13 years old. ?Skin check. ?Lung cancer screening. You may have this screening every year starting at age 42 if you have a 30-pack-year history of smoking and currently smoke or have quit within the past 15 years. ?Fecal occult blood test (FOBT) of the stool. You may have this test every year starting at age 38. ?Flexible sigmoidoscopy or colonoscopy. You may have a sigmoidoscopy every 5 years or a colonoscopy every 10 years starting at age 55. ?Hepatitis C blood test. ?Hepatitis B blood test. ?Sexually transmitted disease (STD) testing. ?Diabetes screening. This is done by checking your blood sugar (glucose) after you have not eaten for a while (fasting). You may have this done every 1-3 years. ?Bone density scan. This is done to screen for osteoporosis. You may have this done starting at age 70. ?Mammogram. This may be done every 1-2 years. Talk to your health care provider about how often you should have regular mammograms. ?Talk with your health care provider about your test results, treatment options, and if necessary, the need for more tests. ?Vaccines  ?Your health care provider may recommend certain vaccines, such as: ?Influenza vaccine. This is recommended every year. ?Tetanus, diphtheria, and acellular pertussis (Tdap, Td) vaccine. You may need a Td  booster every 10 years. ?Zoster vaccine. You may need this after age 80. ?Pneumococcal 13-valent conjugate  (PCV13) vaccine. One dose is recommended after age 78. ?Pneumococcal polysaccharide (PPSV23) vaccine. One dose is recommended after age 78. ?Talk to your health care provider about which screenings and vaccines you need and how often you need them. ?This information is not intended to replace advice given to you by your health care provider. Make sure you discuss any questions you have with your health care provider. ?Document Released: 12/11/2015 Document Revised: 08/03/2016 Document Reviewed: 09/15/2015 ?Elsevier Interactive Patient Education ? 2017 [employer]. ? ?Fall Prevention in the Home ?Falls can cause injuries. They can happen to people of all ages. There are many things you can do to make your home safe and to help prevent falls. ?What can I do on the outside of my home? ?Regularly fix the edges of walkways and driveways and fix any cracks. ?Remove anything that might make you trip as you walk through a door, such as a raised step or threshold. ?Trim any bushes or trees on the path to your home. ?Use bright outdoor lighting. ?Clear any walking paths of anything that might make someone trip, such as rocks or tools. ?Regularly check to see if handrails are loose or broken. Make sure that both sides of any steps have handrails. ?Any raised decks and porches should have guardrails on the edges. ?Have any leaves, snow, or ice cleared regularly. ?Use sand or salt on walking paths during winter. ?Clean up any spills in your garage right away. This includes oil or grease spills. ?What can I do in the bathroom? ?Use night lights. ?Install grab bars by the toilet and in the tub and shower. Do not use towel bars as grab bars. ?Use non-skid mats or decals in the tub or shower. ?If you need to sit down in the shower, use a plastic, non-slip stool. ?Keep the floor dry. Clean up any water that spills on the floor as soon as it happens. ?Remove soap buildup in the tub or shower regularly. ?Attach bath mats securely with  double-sided non-slip rug tape. ?Do not have throw rugs and other things on the floor that can make you trip. ?What can I do in the bedroom? ?Use night lights. ?Make sure that you have a light by your bed that is easy to reach. ?Do not use any sheets or blankets that are too big for your bed. They should not hang down onto the floor. ?Have a firm chair that has side arms. You can use this for support while you get dressed. ?Do not have throw rugs and other things on the floor that can make you trip. ?What can I do in the kitchen? ?Clean up any spills right away. ?Avoid walking on wet floors. ?Keep items that you use a lot in easy-to-reach places. ?If you need to reach something above you, use a strong step stool that has a grab bar. ?Keep electrical cords out of the way. ?Do not use floor polish or wax that makes floors slippery. If you must use wax, use non-skid floor wax. ?Do not have throw rugs and other things on the floor that can make you trip. ?What can I do with my stairs? ?Do not leave any items on the stairs. ?Make sure that there are handrails on both sides of the stairs and use them. Fix handrails that are broken or loose. Make sure that handrails are as long as the stairways. ?Check any carpeting  to make sure that it is firmly attached to the stairs. Fix any carpet that is loose or worn. ?Avoid having throw rugs at the top or bottom of the stairs. If you do have throw rugs, attach them to the floor with carpet tape. ?Make sure that you have a light switch at the top of the stairs and the bottom of the stairs. If you do not have them, ask someone to add them for you. ?What else can I do to help prevent falls? ?Wear shoes that: ?Do not have high heels. ?Have rubber bottoms. ?Are comfortable and fit you well. ?Are closed at the toe. Do not wear sandals. ?If you use a stepladder: ?Make sure that it is fully opened. Do not climb a closed stepladder. ?Make sure that both sides of the stepladder are locked  into place. ?Ask someone to hold it for you, if possible. ?Clearly mark and make sure that you can see: ?Any grab bars or handrails. ?First and last steps. ?Where the edge of each step is. ?Use tools that h

## 2022-02-24 ENCOUNTER — Encounter: Payer: Self-pay | Admitting: Gastroenterology

## 2022-03-03 ENCOUNTER — Encounter: Payer: Self-pay | Admitting: Family Medicine

## 2022-03-07 ENCOUNTER — Encounter: Payer: Self-pay | Admitting: Family Medicine

## 2022-03-07 DIAGNOSIS — H40033 Anatomical narrow angle, bilateral: Secondary | ICD-10-CM | POA: Diagnosis not present

## 2022-03-07 DIAGNOSIS — H25013 Cortical age-related cataract, bilateral: Secondary | ICD-10-CM | POA: Diagnosis not present

## 2022-03-07 DIAGNOSIS — H40013 Open angle with borderline findings, low risk, bilateral: Secondary | ICD-10-CM | POA: Diagnosis not present

## 2022-03-07 DIAGNOSIS — H35373 Puckering of macula, bilateral: Secondary | ICD-10-CM | POA: Diagnosis not present

## 2022-03-07 DIAGNOSIS — H2513 Age-related nuclear cataract, bilateral: Secondary | ICD-10-CM | POA: Diagnosis not present

## 2022-03-07 LAB — HM DIABETES EYE EXAM

## 2022-03-09 ENCOUNTER — Encounter: Payer: Self-pay | Admitting: Gastroenterology

## 2022-03-28 ENCOUNTER — Ambulatory Visit: Payer: Medicare PPO | Admitting: Family Medicine

## 2022-03-28 ENCOUNTER — Encounter: Payer: Self-pay | Admitting: Family Medicine

## 2022-03-28 VITALS — BP 118/64 | HR 69 | Temp 97.5°F | Ht 66.0 in | Wt 140.8 lb

## 2022-03-28 DIAGNOSIS — Z79899 Other long term (current) drug therapy: Secondary | ICD-10-CM | POA: Diagnosis not present

## 2022-03-28 DIAGNOSIS — R739 Hyperglycemia, unspecified: Secondary | ICD-10-CM | POA: Diagnosis not present

## 2022-03-28 DIAGNOSIS — G629 Polyneuropathy, unspecified: Secondary | ICD-10-CM | POA: Diagnosis not present

## 2022-03-28 NOTE — Progress Notes (Signed)
?Phone [phone] ?In person visit ?  ?Subjective:  ? ?Dana Chambers is a 76 y.o. year old very pleasant female patient who presents for/with See problem oriented charting ?Chief Complaint  ?Patient presents with  ? discuss neuropathy  ? ? ?Past Medical History-  ?Patient Active Problem List  ? Diagnosis Date Noted  ? Hyperlipidemia- february 2020 coronary Calcium score of 0 12/12/2018  ?  Priority: Medium   ? Vitamin D insufficiency 08/13/2018  ?  Priority: Medium   ? Elevated glycosylated hemoglobin 08/13/2018  ?  Priority: Medium   ? Rib pain on left side 03/21/2017  ?  Priority: Medium   ? Scoliosis 03/21/2017  ?  Priority: Medium   ? Osteoporosis 06/29/2009  ?  Priority: Medium   ? 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  ?  Priority: Medium   ? GERD 05/01/1999  ?  Priority: Medium   ? Hearing loss associated with syndrome of both ears 12/05/2016  ?  Priority: Low  ? Neuropathy 03/06/2012  ?  Priority: Low  ? COLONIC POLYPS 05/25/2006  ?  Priority: Low  ? ? ?Medications- reviewed and updated ?Current Outpatient Medications  ?Medication Sig Dispense Refill  ? acetaminophen (TYLENOL) 500 MG tablet Take 1,000 mg by mouth 3 (three) times daily as needed.    ? Cholecalciferol (VITAMIN D PO) Take by mouth.    ? Cyanocobalamin (VITAMIN B 12 PO) Take by mouth.    ? denosumab (PROLIA) 60 MG/ML SOSY injection Inject 60 mg into the skin every 6 (six) months.    ? esomeprazole (NEXIUM) 20 MG capsule TAKE ONE CAPSULE BY MOUTH EVERY DAY AT NOON 90 capsule 3  ? ?No current facility-administered medications for this visit.  ? ?  ?Objective:  ?BP 118/64   Pulse 69   Temp (!) 97.5 ?F (36.4 ?C)   Ht $R'5\' 6"'CT$  (1.676 m)   Wt 140 lb 12.8 oz (63.9 kg)   LMP  (LMP Unknown)   SpO2 96%   BMI 22.73 kg/m?  ?Gen: NAD, resting comfortably ?CV: RRR no murmurs rubs or gallops ?Lungs: CTAB no crackles, wheeze, rhonchi  ?Ext: no edema ?Skin: warm, dry ?Msk: no midline back pain- does have some pain in left low back ?   ? ?Assessment and Plan  ? ?# Neuropathy ?S:Winter was very hard on her. Notes pain in bilateral legs from the thighs down to shins and then down also into her calves at times. Gets a numbness/tingling sensation in feet. We had discussed this at 07/16/18 visit  and at that time already had years of issues.  ? ?From that note " She had been having issues with knees in past and was referred to Dr. Ouida Sills of [facility] rheumatology. She brings notes from 2012 from him which show bilateral leg numbness, weakness, cramping since aug 2011. From study reporty "this is a normal study without electrophysiologic evidence of lumbar radiculopathy, plexopathy, polyneuropathy, or myopathy". She also saw Dr. Jannifer Franklin of neurology and on 05/02/11 had MRI of lumbar spine which showed "mild disc degeneration and spondylitic changes without significant compression".  " ?-tsh in 2019 largely normal, a1c eventually found prediabets, b12 low normal - and started on b12 (though she thinks posisbly started earlier: ? ?Most recently has seen Dr. Paulla Fore April 2022 had OMT  for her shoulder and upper back issues without any relief then. Also had epidural 712/22 with Dr. Ernestina Patches without significant relief. on 07/15/21 complaining of tingling in axillary region, intermittent and increasing-  was doing HEP with PT without signfiicant relief- OMT tried again as well as a topical MG12 with arnica and continued therapy including dry needling without relief per patient- patient felt like they reached end of potential benefit.  ? ?Symptoms most bothersome in the morning trying to get moving/going. Worse with bad weather. Average pain level up to 5/10- can be better or worse. Mild help with tylenol ?A/P: 76 year old female with history of neuropathy symptoms dating back to at least 2011- around which time she saw rheumatology as well as neurology (who appears to have done EMG and MRI lumbar spine) without clear cause presenting with questions about  neuropathy/treatment/referral ?-see b12 discussion below- she wants to start with this as first step ?-after discussion prefers to try medicine over referral at this time.  ?-gave several options and discussed these as per AVS- she will think over her options ? ?# B12 deficiency ?S: Current treatment/medication (oral vs. IM):  1000 mcg over the counter for low normal prior values plus on long term PPI which could affect absorption ?Lab Results  ?Component Value Date  ? [id number] 07/16/2018  ?A/P: b12 was low normal in the past before starting supplement- she asks about changing to injections but advised only to do this if levels remain below 400- she is in agreement to checking this today  ? ?# Left low back pain ?S:feels like a "crick in her left low back". Did stand for hours and hours for hole spotting activity. Sitting bothers it as well. Pain for 5 days or so. Saturday up to 5-6/10 and today down to 4/10. Taking tylenol   ?-has skipped water walking ?A/P: appears to be mild improving strain- discussed conservative home care with relative rest and heat- discussed typical course up to 6 weeks but as long as improving can monitor. No midline pain or red flags (other than known paresthesias and pain from neuropathy)  ? ?#Hammer toe with callous on left noted by patient at end of visit- advised podiatry - but she wants to try some home treatments first (using donuts around it to offload pressure)  ? ?# Hyperglycemia/insulin resistance/prediabetes-peak A1c of 6.2 in the past-increased while on statin ?S:  Medication: none ?Lab Results  ?Component Value Date  ? HGBA1C 6.2 12/27/2021  ? HGBA1C 5.9 (H) 08/27/2021  ? HGBA1C CANCELED 08/27/2021  ?A/P: in past I have seen neurology recommend tighter a1c control even for prediabetes- as a result patient wants to recheck a1c though may not be covered by insurance (under 6 months ? ?#Multinodular goiter-noted on ultrasound Apr 21, 2021-no areas met criteria for biopsy.-  we reviewed again  Check TSH at least annually and if note worsening size- can always update imaging  ?Lab Results  ?Component Value Date  ? TSH 1.32 12/27/2021  ? ? ?Recommended follow up: Return for next already scheduled visit or sooner if needed. ?Future Appointments  ?Date Time Provider [place]  ?08/31/2022 10:00 AM Philemon Kingdom, MD [facility] None  ?09/23/2022 10:00 AM Princess Bruins, MD [facility] None  ?01/03/2023  9:40 AM Marin Olp, MD [facility]  ?03/06/2023  8:30 AM [facility] HEALTH COACH [facility]  ? ?Lab/Order associations: ?  ICD-10-CM   ?1. Neuropathy  G62.9 Vitamin B12  ?  Hemoglobin A1c  ?  ?2. High risk medication use  Z79.899 Vitamin B12  ?  ?3. Hyperglycemia  R73.9 Hemoglobin A1c  ?  ? ? ?Time Spent: ?45 minutes of total time (4:00 PM- 4:39  PM,  8:35 PM- 8:41 PM) was spent on the date of the encounter performing the following actions: chart review prior to seeing the patient, obtaining history including reviewing notes from Dr. Paulla Fore directly with patient- really more focused on upper back and shoulder and OMT than lower extremities which is main concern today, performing a medically necessary exam, counseling on the treatment plan and options, placing orders, and documenting in our EHR.  ? ?Return precautions advised.  ?Garret Reddish, MD ? ?

## 2022-03-28 NOTE — Patient Instructions (Addendum)
Let us know when you get your TDAP at your pharmacy. ? ?Please stop by lab before you go ?If you have mychart- we will send your results within 3 business days of [place] receiving them.  ?If you do not have mychart- we will call you about results within 5 business days of [place] receiving them.  ?*please also note that you will see labs on mychart as soon as they post. I will later go in and write notes on them- will say "notes from Dr. Yong Channel"  ? ?Some ideas ?Alpha lipoic acid- supplement '600mg'$  daily ? ?Anti seizure meds ?Low dose gabapentin perhaps '100mg'$  up to 3x a day- starting with once a day ?Lyrica ? ?Antidepressants ?cymbalta ? ?Recommended follow up: Return for next already scheduled visit or sooner if needed.  ? ? ?

## 2022-03-29 LAB — HEMOGLOBIN A1C: Hgb A1c MFr Bld: 6 % (ref 4.6–6.5)

## 2022-03-29 LAB — VITAMIN B12: Vitamin B-12: 1294 pg/mL — ABNORMAL HIGH (ref 211–911)

## 2022-03-30 ENCOUNTER — Encounter: Payer: Self-pay | Admitting: Family Medicine

## 2022-04-01 ENCOUNTER — Encounter: Payer: Self-pay | Admitting: Family Medicine

## 2022-04-04 ENCOUNTER — Encounter: Payer: Self-pay | Admitting: Internal Medicine

## 2022-04-14 ENCOUNTER — Encounter: Payer: Self-pay | Admitting: Podiatry

## 2022-04-14 ENCOUNTER — Ambulatory Visit (INDEPENDENT_AMBULATORY_CARE_PROVIDER_SITE_OTHER): Payer: Medicare PPO

## 2022-04-14 ENCOUNTER — Ambulatory Visit: Payer: Medicare PPO | Admitting: Podiatry

## 2022-04-14 DIAGNOSIS — M2042 Other hammer toe(s) (acquired), left foot: Secondary | ICD-10-CM | POA: Diagnosis not present

## 2022-04-14 DIAGNOSIS — M2041 Other hammer toe(s) (acquired), right foot: Secondary | ICD-10-CM

## 2022-04-14 DIAGNOSIS — M7752 Other enthesopathy of left foot: Secondary | ICD-10-CM | POA: Diagnosis not present

## 2022-04-14 DIAGNOSIS — L84 Corns and callosities: Secondary | ICD-10-CM

## 2022-04-14 DIAGNOSIS — M7751 Other enthesopathy of right foot: Secondary | ICD-10-CM | POA: Diagnosis not present

## 2022-04-14 MED ORDER — TRIAMCINOLONE ACETONIDE 10 MG/ML IJ SUSP
20.0000 mg | Freq: Once | INTRAMUSCULAR | Status: AC
  Administered 2022-04-14: 20 mg

## 2022-04-15 NOTE — Progress Notes (Signed)
Subjective:   Patient ID: Dana Chambers, female   DOB: 76 y.o.   MRN: [id number]   HPI Patient presents stating she is developed a lot of pain between the big toe and the second toe on the left foot and the second toe is completely elevated from where it was previously and no longer touches the floor.  States there is a lesion on the big toe and the second toe that are both sore and make it hard to walk comfortably and also is complaining of pain in her ankles of both feet that have been there for around 6 months.  Patient does not smoke tries to be active   Review of Systems  All other systems reviewed and are negative.      Objective:  Physical Exam Vitals and nursing note reviewed.  Constitutional:      Appearance: She is well-developed.  Pulmonary:     Effort: Pulmonary effort is normal.  Musculoskeletal:        General: Normal range of motion.  Skin:    General: Skin is warm.  Neurological:     Mental Status: She is alert.    Neurovascular status intact muscle strength found to be adequate range of motion within normal limits.  Patient is found to have keratotic lesion on the left second toe and the left big toe that are both very painful when pressed and make shoe gear difficult with elevation of the second toe of the rigid nature.  Patient is found to have discomfort in the sinus tarsi bilateral with inflammation fluid around the sinus tarsi bilateral with quite a bit of pain.  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  I did discuss because of the lack of function of the second toe the consideration for amputation second digit and that may be necessary someday but at this point I did deep debridement of the second digit and  hallux taking off 2 different lesions with no iatrogenic bleeding.  I then went ahead did sterile prep and injected the sinus tarsi bilateral 3 mg Kenalog 5 mg Xylocaine instructed on supportive shoes and reappoint as symptoms indicate  X-rays indicate that there is severe structural deformity second digit left structural bunion deformity bilateral and moderate depression of the arch bilateral

## 2022-04-17 ENCOUNTER — Encounter: Payer: Self-pay | Admitting: Podiatry

## 2022-04-19 ENCOUNTER — Encounter: Payer: Self-pay | Admitting: *Deleted

## 2022-05-28 NOTE — Telephone Encounter (Signed)
Prolia VOB initiated via [email or web address]  Last Prolia inj 01/12/22 Next Prolia inj due 07/13/22  Prior Auth: APPROVED PA# [id number] Valid: 05/15/20-11/27/22

## 2022-06-11 NOTE — Telephone Encounter (Signed)
Pt ready for scheduling on or after 07/13/22  Out-of-pocket cost due at time of visit: $40  Primary: [employer] - Medicare Adv Prolia co-insurance: 0% Admin fee co-insurance: $40  Secondary: n/a Prolia co-insurance:  Admin fee co-insurance:   Deductible: does not apply  Prior Auth: APPROVED PA# [id number] Valid: 05/15/20-11/27/22    ** This summary of benefits is an estimation of the patient's out-of-pocket cost. Exact cost may very based on individual plan coverage.

## 2022-06-15 DIAGNOSIS — H2513 Age-related nuclear cataract, bilateral: Secondary | ICD-10-CM | POA: Diagnosis not present

## 2022-06-15 DIAGNOSIS — H52213 Irregular astigmatism, bilateral: Secondary | ICD-10-CM | POA: Diagnosis not present

## 2022-06-15 DIAGNOSIS — H524 Presbyopia: Secondary | ICD-10-CM | POA: Diagnosis not present

## 2022-06-15 DIAGNOSIS — H25013 Cortical age-related cataract, bilateral: Secondary | ICD-10-CM | POA: Diagnosis not present

## 2022-06-15 DIAGNOSIS — H2511 Age-related nuclear cataract, right eye: Secondary | ICD-10-CM | POA: Diagnosis not present

## 2022-06-15 DIAGNOSIS — H40033 Anatomical narrow angle, bilateral: Secondary | ICD-10-CM | POA: Diagnosis not present

## 2022-06-15 DIAGNOSIS — H40013 Open angle with borderline findings, low risk, bilateral: Secondary | ICD-10-CM | POA: Diagnosis not present

## 2022-07-12 ENCOUNTER — Ambulatory Visit: Payer: Medicare PPO

## 2022-07-15 ENCOUNTER — Ambulatory Visit: Payer: Medicare PPO

## 2022-07-15 DIAGNOSIS — M818 Other osteoporosis without current pathological fracture: Secondary | ICD-10-CM

## 2022-07-15 MED ORDER — DENOSUMAB 60 MG/ML ~~LOC~~ SOSY
60.0000 mg | PREFILLED_SYRINGE | Freq: Once | SUBCUTANEOUS | Status: AC
  Administered 2022-07-15: 60 mg via SUBCUTANEOUS

## 2022-07-15 NOTE — Progress Notes (Signed)
Patient verbally confirmed name, date of birth, and correct medication to be administered. Prolia injection administered and pt tolerated well.  

## 2022-07-20 ENCOUNTER — Encounter: Payer: Self-pay | Admitting: Internal Medicine

## 2022-07-28 NOTE — Telephone Encounter (Signed)
Last Prolia inj 07/15/22 Next Prolia inj due 01/16/23

## 2022-08-09 DIAGNOSIS — H2511 Age-related nuclear cataract, right eye: Secondary | ICD-10-CM | POA: Diagnosis not present

## 2022-08-09 DIAGNOSIS — H25811 Combined forms of age-related cataract, right eye: Secondary | ICD-10-CM | POA: Diagnosis not present

## 2022-08-18 ENCOUNTER — Encounter: Payer: Self-pay | Admitting: Family Medicine

## 2022-08-18 ENCOUNTER — Other Ambulatory Visit: Payer: Self-pay | Admitting: Obstetrics & Gynecology

## 2022-08-23 ENCOUNTER — Encounter: Payer: Self-pay | Admitting: Family Medicine

## 2022-08-24 ENCOUNTER — Ambulatory Visit: Payer: Medicare PPO | Admitting: Internal Medicine

## 2022-08-24 ENCOUNTER — Encounter: Payer: Self-pay | Admitting: Internal Medicine

## 2022-08-24 VITALS — BP 131/79 | HR 81 | Temp 95.9°F | Resp 14 | Ht 66.0 in | Wt 142.4 lb

## 2022-08-24 DIAGNOSIS — R350 Frequency of micturition: Secondary | ICD-10-CM

## 2022-08-24 DIAGNOSIS — N3000 Acute cystitis without hematuria: Secondary | ICD-10-CM

## 2022-08-24 DIAGNOSIS — Z87440 Personal history of urinary (tract) infections: Secondary | ICD-10-CM | POA: Diagnosis not present

## 2022-08-24 LAB — POCT URINALYSIS DIPSTICK
Bilirubin, UA: NEGATIVE
Blood, UA: NEGATIVE
Glucose, UA: NEGATIVE
Ketones, UA: NEGATIVE
Nitrite, UA: NEGATIVE
Protein, UA: NEGATIVE
Spec Grav, UA: 1.01 (ref 1.010–1.025)
Urobilinogen, UA: 0.2 E.U./dL
pH, UA: 6 (ref 5.0–8.0)

## 2022-08-24 MED ORDER — SULFAMETHOXAZOLE-TRIMETHOPRIM 800-160 MG PO TABS: 1.0000 | ORAL_TABLET | Freq: Two times a day (BID) | ORAL | 0 refills | Status: DC

## 2022-08-24 NOTE — Progress Notes (Signed)
  [place] at [employer]:  [phone]   Routine Medical Office Visit  Patient:  Dana Chambers      Age: 76 y.o.       Sex:  female  Date:   08/24/2022  PCP:    Marin Olp, MD    [place] Provider: Loralee Pacas, MD  Assessment/Plan:   [name] was seen today for possible uti.  Acute cystitis without hematuria -     POCT urinalysis dipstick -     Urinalysis,Complete w/RFL Culture -     Sulfamethoxazole-Trimethoprim; Take 1 tablet by mouth 2 (two) times daily.  Dispense: 14 tablet; Refill: 0  Frequent urination  History of UTI   Additional information was provided in the AVS (see AVS) regarding the diagnosis/treatment plan for her to review    Subjective:   Dana Chambers is a 76 y.o. female with PMH significant for: Past Medical History:  Diagnosis Date   Basal cell carcinoma of skin    left ankle, right thigh   Bursitis    right hip   Cancer ([place]) 2006   Breast/right   COVID 01/27/2021   Diverticulosis    GERD (gastroesophageal reflux disease)    History of breast cancer/33 radiation treatments/ no chemo    2006  S/P RIGHT LUMPECTOMY AND RADIATION FOLLOWED BY DR RUBIN--  NO RECURRENCE   History of kidney stones    Hyperplastic colon polyp    IBS (irritable bowel syndrome)    Idiopathic peripheral neuropathy    bilateral tingling/ due to fall in [facility]   Internal hemorrhoid    Kidney stone    Leiomyoma    Multiple small   Liver cyst 2006   Benign   OA (osteoarthritis)    Osteoporosis 2019   T score -2.5   Personal history of radiation therapy    Raynaud disease    Renal calculus, left    non obstructive   Rib pain on left side    able to lay on left side   Rosacea    Tinnitus      She main concern for today's visit is: Chief Complaint  Patient presents with   Possible UTI     Additional physician collected history: Just has urgency   Positive for: Occasional leakage with walking 3-4 uti in last 10  years History of kidney stones with lithotripsy 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 Negative for: Fevers Flank pain History of surgical Suprapubic pain       Objective:  Physical Exam: BP 131/79 (BP Location: Left Arm, Patient Position: Sitting)   Pulse 81   Temp (!) 95.9 F (35.5 C) (Temporal)   Resp 14   Ht '5\' 6"'$  (1.676 m)   Wt 142 lb 6.4 oz (64.6 kg)   LMP  (LMP Unknown)   SpO2 99%   BMI 22.98 kg/m   She  is a polite, friendly, and genuine person Constitutional: NAD, AAO, not ill-appearing  Neuro: alert, no focal deficit obvious, articulate speech Psych: normal mood, behavior, thought content   Problem specific physical exam findings:  No suprapubic tenderness No cva tenderness

## 2022-08-24 NOTE — Patient Instructions (Addendum)
It was a pleasure seeing you today!  Today the plan is...  Pick up the Bactrim at the pharmacy and we will send out for the culture and microscopic evaluation for you to review on Friday.  Take the entire 7 days of the medication even if your symptoms are gone within the first day or 2 to make sure we totally eradicate the infection  Acute cystitis without hematuria -     POCT urinalysis dipstick -     Urinalysis,Complete w/RFL Culture -     Sulfamethoxazole-Trimethoprim; Take 1 tablet by mouth 2 (two) times daily.  Dispense: 14 tablet; Refill: 0  Frequent urination  History of UTI     Loralee Pacas, MD   Return if symptoms worsen or fail to improve.   - If your condition fails to resolve or you have other questions / concerns: please contact me via phone [phone] or MyChart messaging.  - Please bring all your medicines to your next appointment. This is the best way for me to know exactly what you're taking.  - If your condition begins to worsen or become severe:  go to the ER.   IF you received an x-ray today, you will receive an invoice from [facility]. Please contact [facility] at [phone] with questions or concerns regarding your invoice.    IF you received labwork today, you will receive an invoice from [facility]. Please contact [facility] at [phone] with questions or concerns regarding your invoice.    Our billing staff will not be able to assist you with questions regarding bills from these companies.   --------------------------------------------------------------------------------------------------------------------  You will be contacted with the lab results as soon as they are available. The fastest way to get your results is to activate your My Chart account. Instructions are located on the last page of this paperwork. If you have not heard from [place] regarding the results in 2 weeks, please contact this office. For any labs or imaging  tests, we will call you if the results are significantly abnormal.  Most normal results will be posted to myChart as soon as they are available and I will comment on them there within 2-3 business days.          Urinary Tract Infection, Adult  A urinary tract infection (UTI) is an infection of any part of the urinary tract. The urinary tract includes the kidneys, ureters, bladder, and urethra. These organs make, store, and get rid of urine in the body. An upper UTI affects the ureters and kidneys. A lower UTI affects the bladder and urethra. What are the causes? Most urinary tract infections are caused by bacteria in your genital area around your urethra, where urine leaves your body. These bacteria grow and cause inflammation of your urinary tract. What increases the risk? You are more likely to develop this condition if: You have a urinary catheter that stays in place. You are not able to control when you urinate or have a bowel movement (incontinence). You are female and you: Use a spermicide or diaphragm for birth control. Have low estrogen levels. Are pregnant. You have certain genes that increase your risk. You are sexually active. You take antibiotic medicines. You have a condition that causes your flow of urine to slow down, such as: An enlarged prostate, if you are female. Blockage in your urethra. A kidney stone. A nerve condition that affects your bladder control (neurogenic bladder). Not getting enough to drink, or not urinating often. You have  certain medical conditions, such as: Diabetes. A weak disease-fighting system (immunesystem). Sickle cell disease. Gout. Spinal cord injury. What are the signs or symptoms? Symptoms of this condition include: Needing to urinate right away (urgency). Frequent urination. This may include small amounts of urine each time you urinate. Pain or burning with urination. Blood in the urine. Urine that smells bad or unusual. Trouble  urinating. Cloudy urine. Vaginal discharge, if you are female. Pain in the abdomen or the lower back. You may also have: Vomiting or a decreased appetite. Confusion. Irritability or tiredness. A fever or chills. Diarrhea. The first symptom in older adults may be confusion. In some cases, they may not have any symptoms until the infection has worsened. How is this diagnosed? This condition is diagnosed based on your medical history and a physical exam. You may also have other tests, including: Urine tests. Blood tests. Tests for STIs (sexually transmitted infections). If you have had more than one UTI, a cystoscopy or imaging studies may be done to determine the cause of the infections. How is this treated? Treatment for this condition includes: Antibiotic medicine. Over-the-counter medicines to treat discomfort. Drinking enough water to stay hydrated. If you have frequent infections or have other conditions such as a kidney stone, you may need to see a health care provider who specializes in the urinary tract (urologist). In rare cases, urinary tract infections can cause sepsis. Sepsis is a life-threatening condition that occurs when the body responds to an infection. Sepsis is treated in the hospital with IV antibiotics, fluids, and other medicines. Follow these instructions at home:  Medicines Take over-the-counter and prescription medicines only as told by your health care provider. If you were prescribed an antibiotic medicine, take it as told by your health care provider. Do not stop using the antibiotic even if you start to feel better. General instructions Make sure you: Empty your bladder often and completely. Do not hold urine for long periods of time. Empty your bladder after sex. Wipe from front to back after urinating or having a bowel movement if you are female. Use each tissue only one time when you wipe. Drink enough fluid to keep your urine pale yellow. Keep all  follow-up visits. This is important. Contact a health care provider if: Your symptoms do not get better after 1-2 days. Your symptoms go away and then return. Get help right away if: You have severe pain in your back or your lower abdomen. You have a fever or chills. You have nausea or vomiting. Summary A urinary tract infection (UTI) is an infection of any part of the urinary tract, which includes the kidneys, ureters, bladder, and urethra. Most urinary tract infections are caused by bacteria in your genital area. Treatment for this condition often includes antibiotic medicines. If you were prescribed an antibiotic medicine, take it as told by your health care provider. Do not stop using the antibiotic even if you start to feel better. Keep all follow-up visits. This is important. This information is not intended to replace advice given to you by your health care provider. Make sure you discuss any questions you have with your health care provider. Document Revised: 06/26/2020 Document Reviewed: 06/26/2020 Elsevier Patient Education  [place].

## 2022-08-25 ENCOUNTER — Encounter: Payer: Self-pay | Admitting: Internal Medicine

## 2022-08-26 ENCOUNTER — Ambulatory Visit: Payer: Medicare PPO | Admitting: Podiatry

## 2022-08-26 ENCOUNTER — Encounter: Payer: Self-pay | Admitting: Podiatry

## 2022-08-26 ENCOUNTER — Other Ambulatory Visit: Payer: Self-pay | Admitting: Podiatry

## 2022-08-26 DIAGNOSIS — M1A0790 Idiopathic chronic gout, unspecified ankle and foot, without tophus (tophi): Secondary | ICD-10-CM

## 2022-08-26 DIAGNOSIS — M7752 Other enthesopathy of left foot: Secondary | ICD-10-CM | POA: Diagnosis not present

## 2022-08-26 DIAGNOSIS — M2042 Other hammer toe(s) (acquired), left foot: Secondary | ICD-10-CM

## 2022-08-26 DIAGNOSIS — M2041 Other hammer toe(s) (acquired), right foot: Secondary | ICD-10-CM | POA: Diagnosis not present

## 2022-08-26 DIAGNOSIS — L84 Corns and callosities: Secondary | ICD-10-CM

## 2022-08-26 NOTE — Progress Notes (Signed)
r 

## 2022-08-26 NOTE — Progress Notes (Signed)
Subjective:   Patient ID: Dana Chambers, female   DOB: 76 y.o.   MRN: [id number]   HPI Patient presents stating she is got significant hammertoe deformity she has significant pain in her bone structure of both feet and a very painful lesion second digit left foot along with structural bunion neurovascular   ROS      Objective:  Physical Exam  Status intact severe elevation second digit left keratotic lesion within the digit itself painful and digital deformities bilateral with inflammation of the plantar capsule bilateral with prominent bone structure severe hammertoe deformity second digit left with keratotic lesion formation and inflammatory capsulitis with structural deformity bilateral     Assessment:  8 should be reviewed both conditions and for structural we discussed orthotics but today we will get a go shoe gear modifications padding therapy and cushion.  I went ahead today Sharp sterile debridement occurred of the lesion no angiogenic bleeding cushioning reappoint as needed may require ultimate amputation of digit and I made her aware of today     Plan:  Above is listed

## 2022-08-27 LAB — URINALYSIS, COMPLETE W/RFL CULTURE
Bilirubin Urine: NEGATIVE
Glucose, UA: NEGATIVE
Hgb urine dipstick: NEGATIVE
Hyaline Cast: NONE SEEN /LPF
Ketones, ur: NEGATIVE
Nitrites, Initial: NEGATIVE
Protein, ur: NEGATIVE
RBC / HPF: NONE SEEN /HPF (ref 0–2)
Specific Gravity, Urine: 1.006 (ref 1.001–1.035)
Squamous Epithelial / HPF: NONE SEEN /HPF (ref <=5)
WBC, UA: >=60 /HPF — AB (ref 0–5)
pH: 6.5 (ref 5.0–8.0)

## 2022-08-27 LAB — URINE CULTURE
MICRO NUMBER:: 13979129
SPECIMEN QUALITY:: ADEQUATE

## 2022-08-27 LAB — CULTURE INDICATED

## 2022-08-29 NOTE — Progress Notes (Signed)
I have reviewed your recent results and, in my medical opinion:  The bactrim should have worked to eradicate your urinary tract infection.  There was nothing in the urine but uti.  Loralee Pacas, MD  08/29/2022 8:05 AM

## 2022-08-30 LAB — COMPREHENSIVE METABOLIC PANEL
ALT: 13 IU/L (ref 0–32)
AST: 20 IU/L (ref 0–40)
Albumin/Globulin Ratio: 2.1 (ref 1.2–2.2)
Albumin: 5.1 g/dL — ABNORMAL HIGH (ref 3.8–4.8)
Alkaline Phosphatase: 70 IU/L (ref 44–121)
BUN/Creatinine Ratio: 14 (ref 12–28)
BUN: 16 mg/dL (ref 8–27)
Bilirubin Total: <0.2 mg/dL (ref 0.0–1.2)
CO2: 23 mmol/L (ref 20–29)
Calcium: 10.1 mg/dL (ref 8.7–10.3)
Chloride: 102 mmol/L (ref 96–106)
Creatinine, Ser: 1.14 mg/dL — ABNORMAL HIGH (ref 0.57–1.00)
Globulin, Total: 2.4 g/dL (ref 1.5–4.5)
Glucose: 82 mg/dL (ref 70–99)
Potassium: 5.5 mmol/L — ABNORMAL HIGH (ref 3.5–5.2)
Sodium: 146 mmol/L — ABNORMAL HIGH (ref 134–144)
Total Protein: 7.5 g/dL (ref 6.0–8.5)
eGFR: 50 mL/min/{1.73_m2} — ABNORMAL LOW (ref >59)

## 2022-08-30 LAB — CBC WITH DIFFERENTIAL/PLATELET
Basophils Absolute: 0.1 10*3/uL (ref 0.0–0.2)
Basos: 1 %
EOS (ABSOLUTE): 0.1 10*3/uL (ref 0.0–0.4)
Eos: 1 %
Hematocrit: 43.6 % (ref 34.0–46.6)
Hemoglobin: 14.3 g/dL (ref 11.1–15.9)
Immature Grans (Abs): 0.1 10*3/uL (ref 0.0–0.1)
Immature Granulocytes: 1 %
Lymphocytes Absolute: 4.6 10*3/uL — ABNORMAL HIGH (ref 0.7–3.1)
Lymphs: 47 %
MCH: 30.4 pg (ref 26.6–33.0)
MCHC: 32.8 g/dL (ref 31.5–35.7)
MCV: 93 fL (ref 79–97)
Monocytes Absolute: 0.7 10*3/uL (ref 0.1–0.9)
Monocytes: 8 %
Neutrophils Absolute: 4 10*3/uL (ref 1.4–7.0)
Neutrophils: 42 %
Platelets: 402 10*3/uL (ref 150–450)
RBC: 4.71 x10E6/uL (ref 3.77–5.28)
RDW: 12.5 % (ref 11.7–15.4)
WBC: 9.5 10*3/uL (ref 3.4–10.8)

## 2022-08-30 LAB — C-REACTIVE PROTEIN: CRP: 3 mg/L (ref 0–10)

## 2022-08-30 LAB — ANA: ANA Titer 1: NEGATIVE

## 2022-08-30 LAB — URIC ACID: Uric Acid: 5 mg/dL (ref 3.1–7.9)

## 2022-08-30 LAB — SEDIMENTATION RATE: Sed Rate: 8 mm/hr (ref 0–40)

## 2022-08-30 LAB — RHEUMATOID FACTOR: Rheumatoid fact SerPl-aCnc: <10 IU/mL (ref <14.0)

## 2022-08-31 ENCOUNTER — Ambulatory Visit: Payer: Medicare PPO | Admitting: Internal Medicine

## 2022-08-31 ENCOUNTER — Encounter: Payer: Self-pay | Admitting: Podiatry

## 2022-08-31 DIAGNOSIS — H25012 Cortical age-related cataract, left eye: Secondary | ICD-10-CM | POA: Diagnosis not present

## 2022-08-31 DIAGNOSIS — H2512 Age-related nuclear cataract, left eye: Secondary | ICD-10-CM | POA: Diagnosis not present

## 2022-09-05 ENCOUNTER — Encounter: Payer: Self-pay | Admitting: Family Medicine

## 2022-09-06 DIAGNOSIS — H25812 Combined forms of age-related cataract, left eye: Secondary | ICD-10-CM | POA: Diagnosis not present

## 2022-09-06 DIAGNOSIS — H2512 Age-related nuclear cataract, left eye: Secondary | ICD-10-CM | POA: Diagnosis not present

## 2022-09-06 DIAGNOSIS — H25012 Cortical age-related cataract, left eye: Secondary | ICD-10-CM | POA: Diagnosis not present

## 2022-09-23 ENCOUNTER — Encounter: Payer: Self-pay | Admitting: Obstetrics & Gynecology

## 2022-09-23 ENCOUNTER — Encounter: Payer: Self-pay | Admitting: Family Medicine

## 2022-09-23 ENCOUNTER — Ambulatory Visit (INDEPENDENT_AMBULATORY_CARE_PROVIDER_SITE_OTHER): Payer: Medicare PPO | Admitting: Obstetrics & Gynecology

## 2022-09-23 ENCOUNTER — Telehealth: Payer: Self-pay

## 2022-09-23 VITALS — BP 118/78 | Ht 63.0 in | Wt 143.0 lb

## 2022-09-23 DIAGNOSIS — N390 Urinary tract infection, site not specified: Secondary | ICD-10-CM

## 2022-09-23 DIAGNOSIS — Z853 Personal history of malignant neoplasm of breast: Secondary | ICD-10-CM

## 2022-09-23 DIAGNOSIS — Z01419 Encounter for gynecological examination (general) (routine) without abnormal findings: Secondary | ICD-10-CM | POA: Diagnosis not present

## 2022-09-23 DIAGNOSIS — N644 Mastodynia: Secondary | ICD-10-CM

## 2022-09-23 DIAGNOSIS — Z780 Asymptomatic menopausal state: Secondary | ICD-10-CM | POA: Diagnosis not present

## 2022-09-23 DIAGNOSIS — M810 Age-related osteoporosis without current pathological fracture: Secondary | ICD-10-CM

## 2022-09-23 DIAGNOSIS — Z9189 Other specified personal risk factors, not elsewhere classified: Secondary | ICD-10-CM

## 2022-09-23 NOTE — Progress Notes (Signed)
Dana Chambers 07/30/1946 [id number]   History:    76 y.o. G1P1L1   RP:  Established patient presenting for annual gyn exam and Rt Breast Pain   HPI: Postmenopausal, well on no HRT.  No significant hot flashes or night sweats.  No vaginal bleeding. Pap smear Neg 08/2021.  No significant history of abnormal Pap smears.  Would like to repeat every 2 years.  Breasts Left normal/Right pain at external quadrants.  H/O Rt breast Ca. Mammo Neg 08/2021. Colono 01/2022.  Osteoporosis on Prolia.  DEXA 09/2020.  Following with Dr. Cruzita Lederer.  BMI 25.33. Health labs with Fam MD.   Past medical history,surgical history, family history and social history were all reviewed and documented in the EPIC chart.  Gynecologic History No LMP recorded (lmp unknown). Patient is postmenopausal.  Obstetric History OB History  Gravida Para Term Preterm AB Living  '1 1 1   '$ 0 1  SAB IAB Ectopic Multiple Live Births  0   0        # Outcome Date GA Lbr Len/2nd Weight Sex Delivery Anes PTL Lv  1 Term              ROS: A ROS was performed and pertinent positives and negatives are included in the history. GENERAL: No fevers or chills. HEENT: No change in vision, no earache, sore throat or sinus congestion. NECK: No pain or stiffness. CARDIOVASCULAR: No chest pain or pressure. No palpitations. PULMONARY: No shortness of breath, cough or wheeze. GASTROINTESTINAL: No abdominal pain, nausea, vomiting or diarrhea, melena or bright red blood per rectum. GENITOURINARY: No urinary frequency, urgency, hesitancy or dysuria. MUSCULOSKELETAL: No joint or muscle pain, no back pain, no recent trauma. DERMATOLOGIC: No rash, no itching, no lesions. ENDOCRINE: No polyuria, polydipsia, no heat or cold intolerance. No recent change in weight. HEMATOLOGICAL: No anemia or easy bruising or bleeding. NEUROLOGIC: No headache, seizures, numbness, tingling or weakness. PSYCHIATRIC: No depression, no loss of interest in normal activity or change  in sleep pattern.     Exam:   BP 118/78 (BP Location: Right Arm, Patient Position: Sitting, Cuff Size: Normal)   Ht '5\' 3"'$  (1.6 m)   Wt 143 lb (64.9 kg)   LMP  (LMP Unknown)   BMI 25.33 kg/m   Body mass index is 25.33 kg/m.  General appearance : Well developed well nourished female. No acute distress HEENT: Eyes: no retinal hemorrhage or exudates,  Neck supple, trachea midline, no carotid bruits, no thyroidmegaly Lungs: Clear to auscultation, no rhonchi or wheezes, or rib retractions  Heart: Regular rate and rhythm, no murmurs or gallops Breast:Examined in sitting and supine position were symmetrical in appearance, no palpable masses or tenderness,  no skin retraction, no nipple inversion, no nipple discharge, no skin discoloration, no axillary or supraclavicular lymphadenopathy Abdomen: no palpable masses or tenderness, no rebound or guarding Extremities: no edema or skin discoloration or tenderness  Pelvic: Vulva: Normal             Vagina: No gross lesions or discharge  Cervix: No gross lesions or discharge  Uterus  AV, normal size, shape and consistency, non-tender and mobile  Adnexa  Without masses or tenderness  Anus: Normal  U/A: Yellow clear, Nit Neg, Pro Neg, WBC 0-5, RBC Neg, Bacteria Few.     Assessment/Plan:  76 y.o. female for annual exam   1. wellness gyn exam at high risk for H/O breast cancer Postmenopausal, well on no HRT.  No significant hot  flashes or night sweats.  No vaginal bleeding. Pap smear Neg 08/2021.  No significant history of abnormal Pap smears.  Would like to repeat every 2 years.  Breasts Left normal/Right pain at external quadrants.  H/O Rt breast Ca. Mammo Neg 08/2021. Colono 01/2022.  Osteoporosis on Prolia.  DEXA 09/2020.  Following with Dr. Cruzita Lederer.  BMI 25.33. Health labs with Fam MD.  2. Postmenopause Postmenopausal, well on no HRT.  No significant hot flashes or night sweats.  No vaginal bleeding.  3. Postmenopausal osteoporosis   Osteoporosis on Prolia.  DEXA 09/2020.  Will repeat BD this November.  Following with Dr. Cruzita Lederer.  4. Frequent UTI U/A wnl. - Urinalysis,Complete w/RFL Culture  5. Pain of right breast Rt Dx Mammo/US.  Lt Screening mammo.  6. Personal history of breast cancer  Other orders - denosumab (PROLIA) 60 MG/ML SOSY injection; Inject 60 mg into the skin every 6 (six) months. - Urine Culture - REFLEXIVE URINE CULTURE   Princess Bruins MD, 10:32 AM 09/23/2022

## 2022-09-23 NOTE — Telephone Encounter (Signed)
-----   Message from Princess Bruins, MD sent at 09/23/2022 10:45 AM EDT ----- Regarding: Schedule Rt Dx mammo/US and Lt Screening Mammo Rt external quadrants breast pain.  H/O Rt breast Ca.

## 2022-09-23 NOTE — Telephone Encounter (Signed)
Pt scheduled for 10/06/2022 @ 8:30. Pt notified and voiced understanding.

## 2022-09-26 ENCOUNTER — Ambulatory Visit: Payer: Medicare PPO | Admitting: Internal Medicine

## 2022-09-26 ENCOUNTER — Encounter: Payer: Self-pay | Admitting: Obstetrics & Gynecology

## 2022-09-26 ENCOUNTER — Encounter: Payer: Self-pay | Admitting: Internal Medicine

## 2022-09-26 VITALS — BP 132/84 | HR 44 | Ht 66.0 in | Wt 145.0 lb

## 2022-09-26 DIAGNOSIS — R7303 Prediabetes: Secondary | ICD-10-CM

## 2022-09-26 DIAGNOSIS — E559 Vitamin D deficiency, unspecified: Secondary | ICD-10-CM | POA: Diagnosis not present

## 2022-09-26 DIAGNOSIS — M818 Other osteoporosis without current pathological fracture: Secondary | ICD-10-CM

## 2022-09-26 LAB — URINALYSIS, COMPLETE W/RFL CULTURE
Bilirubin Urine: NEGATIVE
Glucose, UA: NEGATIVE
Hgb urine dipstick: NEGATIVE
Hyaline Cast: NONE SEEN /LPF
Ketones, ur: NEGATIVE
Leukocyte Esterase: NEGATIVE
Nitrites, Initial: NEGATIVE
Protein, ur: NEGATIVE
RBC / HPF: NONE SEEN /HPF (ref 0–2)
Specific Gravity, Urine: 1.015 (ref 1.001–1.035)
pH: 5.5 (ref 5.0–8.0)

## 2022-09-26 LAB — VITAMIN D 25 HYDROXY (VIT D DEFICIENCY, FRACTURES): VITD: 48.1 ng/mL (ref 30.00–100.00)

## 2022-09-26 LAB — CULTURE INDICATED

## 2022-09-26 LAB — URINE CULTURE
MICRO NUMBER:: 14110469
Result:: NO GROWTH
SPECIMEN QUALITY:: ADEQUATE

## 2022-09-26 LAB — HEMOGLOBIN A1C: Hgb A1c MFr Bld: 6.1 % (ref 4.6–6.5)

## 2022-09-26 NOTE — Patient Instructions (Signed)
Please continue vitamin D 2000 units daily.  Please stop at the lab.  Please come back for a follow-up appointment in 1 year.

## 2022-09-26 NOTE — Progress Notes (Addendum)
Patient ID: Dana Chambers, female   DOB: 05-12-1946, 76 y.o.   MRN: [id number]  HPI  Dana Chambers is a 76 y.o.-year-old female, initially referred by Dr. Phineas Real, for management of osteoporosis.  Last visit 1 year ago.  Interim history: She denies dizziness, vertigo.  However, she has hammertoes and has tenderness and even a small ulcer - healing -on the bottoms of her feet and she has instability in her feet as she is trying to avoid the pain when walking.She continues to have neuropathy sxs in legs, chronic.  She sees Dr. Paulla Dolly. No falls or fractures since last visit. She has B knee swelling. She just had cataract Sx by Dr. [name]. On eye drops. She could not go to the gym in last 2 mo, but now restarted.  Reviewed history: Pt was dx with OP in 2006.  Reviewed patient's DXA scan reports: Date L1-L3 T score FN T score 33% distal Radius Ultra distal radius   10/19/2020 ([facility]) -0.3 (-1.6%) RFN: -2.4 (+1.5%) LFN: -2.5 (-6.8%*) -1.5 (+9.8%*) N/a  10/17/2018 ([facility]) -0.4 (+12.6%*) RFN: -2.5 (+2.0%) LFN: -2.1 (+3.9%) -2.4 (-3.6%) n/a  10/12/2016 ([facility])  -1.4 (-2%)  RFN: -2.6 LFN: -2.4  -2.1 (-2.9%)  n/a  10/12/2015 ([facility])  -1.3 (+7.1%*)  RFN: -2.6 LFN: -2.7 -1.8  n/a  10/09/2013 ([facility])  -1.8  RFN: -2.5 LFN: -2.2 -3.0 (-14.9%*)  -4.5   09/07/2011 ([facility])  n/a RFN: -2.2   -3.1 (+3.0%)   08/26/2009 ([facility])  -1.1  RFN: -1.7 LFN: -1.9     She denies any fractures. In the past, she had a fall (slipped) x1 >> on coccyx + hands >> no fxs, but developed traumatic carpal tunnel.    Reviewed previous osteoporosis treatments: - Bisphosphonates (Fosamax, Boniva) for approximately 10 years, followed by drug holiday - Prolia x 11inj now -started in 2016, latest: 11/26/2019  05/27/2020 12/30/2020 07/07/2021 01/12/2022 07/15/2022  We started supplementation in 2018.  Subsequent vitamin D levels were normal: Lab Results  Component Value Date    VD25OH 53.7 08/27/2021   VD25OH 57.7 08/27/2021   VD25OH 47.5 08/21/2020   VD25OH 67.38 08/19/2019   VD25OH 41.53 12/12/2018   VD25OH 40.81 08/13/2018   VD25OH 37.58 12/06/2017   VD25OH 26.10 (L) 08/10/2017   VD25OH 50 07/12/2011   VD25OH 55 06/28/2010   She is on vitamin D 2000 units daily.  No weightbearing exercises. She goes to [facility] - water walking 45-60 min twice a week.  In the past, she was also using weights but not anymore due to upper back pain.  Menopause was at 76 years old.  Pt does have a FH of osteoporosis: In mother, who also had dowagers hump. Mother's aunt also had this.  No history of hyper or hypocalcemia or hyperparathyroidism.  She has a history of kidney stones and had lithotripsy x2.  No history of hyper or hypocalcemia or hyperparathyroidism. Lab Results  Component Value Date   PTH 48 08/10/2017   PTH Comment 08/10/2017   CALCIUM 10.1 08/26/2022   CALCIUM 9.2 12/27/2021   CALCIUM 9.8 08/27/2021   CALCIUM 10.2 08/27/2021   CALCIUM 9.6 12/21/2020   CALCIUM 9.3 05/06/2020   CALCIUM 9.7 12/18/2019   CALCIUM 9.7 08/19/2019   CALCIUM 9.4 12/12/2018   CALCIUM 9.8 08/13/2018   No thyrotoxicosis: Lab Results  Component Value Date   TSH 1.32 12/27/2021   TSH 1.35 04/16/2021   TSH 1.05 07/16/2018   TSH 1.05 11/29/2016   TSH 1.25 11/12/2015  She had a neck MRI >> thyroid nodules >> U/S (04/21/2021): small nodules, not worrisome: 1. Findings suggestive of multinodular goiter. 2. None of the discretely measured nodules/cysts, including dominant right-sided partially solid though predominantly cystic nodule correlating with nodule seen on preceding cervical spine MRI, meet imaging criteria to recommend percutaneous sampling or continued dedicated follow-up.  No CKD. Last BUN/Cr: Lab Results  Component Value Date   BUN 16 08/26/2022   CREATININE 1.14 (H) 08/26/2022  GFR 50.  She had BrCA 2006, s/p RxTx, no ChTx >> she was on tamoxifen for 4.5  years.  She continues on PPIs for GERD, but the lower dose, 20 mg daily of Nexium.  She also has a history of neuropathy - mostly in L leg.  She is on B12 supplements.  She has a history of elevated HbA1c: Lab Results  Component Value Date   HGBA1C 6.0 03/28/2022   HGBA1C 6.2 12/27/2021   HGBA1C 5.9 (H) 08/27/2021   HGBA1C CANCELED 08/27/2021   HGBA1C 6.0 12/21/2020   HGBA1C 6.2 08/21/2020   HGBA1C 5.7 05/06/2020   HGBA1C 5.7 12/18/2019   HGBA1C 5.9 08/19/2019   HGBA1C 5.9 12/12/2018   She had L shoulder pain in 11/2020 >> sees Dr. Paulla Fore (suspected muscle problem) >> PT and dry needling - did not help.  ROS: + see HPI -  + acid reflux - on Nexium 20 mg dose  I reviewed pt's medications, allergies, PMH, social hx, family hx, and changes were documented in the history of present illness. Otherwise, unchanged from my initial visit note.  Past Medical History:  Diagnosis Date   Basal cell carcinoma of skin    left ankle, right thigh   Bursitis    right hip   Cancer ([facility]) 2006   Breast/right   COVID 01/27/2021   Diverticulosis    GERD (gastroesophageal reflux disease)    History of breast cancer/33 radiation treatments/ no chemo    2006  S/P RIGHT LUMPECTOMY AND RADIATION FOLLOWED BY DR RUBIN--  NO RECURRENCE   History of kidney stones    Hyperplastic colon polyp    IBS (irritable bowel syndrome)    Idiopathic peripheral neuropathy    bilateral tingling/ due to fall in [facility]   Internal hemorrhoid    Kidney stone    Leiomyoma    Multiple small   Liver cyst 2006   Benign   OA (osteoarthritis)    Osteoporosis 2019   T score -2.5   Personal history of radiation therapy    Raynaud disease    Renal calculus, left    non obstructive   Rib pain on left side    able to lay on left side   Rosacea    Tinnitus    Past Surgical History:  Procedure Laterality Date   BREAST BIOPSY Left 2016   benign   BREAST EXCISIONAL BIOPSY Right 1973   benign   BREAST LUMPECTOMY   2006   Right -- S/P RADIATION--  NO RECURRENCE   CATARACT EXTRACTION     DILATION AND CURETTAGE OF UTERUS     EXTRACORPOREAL SHOCK WAVE LITHOTRIPSY     X2   HYSTEROSCOPY     HYSTEROSCOPY WITH D & C  08/03/2012   Procedure: DILATATION AND CURETTAGE /HYSTEROSCOPY;  Surgeon: Anastasio Auerbach, MD;  Location: [facility];  Service: Gynecology;  Laterality: N/A;   SUPRANUMERARY NIPPLE EXCISION  1973   CYST   TONSILLECTOMY  AGE 18   Social History  Social History   Marital status: Married    Spouse name: N/A   Number of children: 1   Occupational History   Retired- Psychologist, prison and probation services Also, used to sing >> now hoarseness   Social History Main Topics   Smoking status: Never Smoker   Smokeless tobacco: Never Used   Alcohol use 3.6 oz/week    6 Standard drinks or equivalent per week   Drug use: No   Social History Narrative   Married (husband patient of Dr. Yong Channel). 1 daughter. 1 granddaughter.       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       Hobbies: reading, travel, time with granddaughter. Very active   Current Outpatient Medications on File Prior to Visit  Medication Sig Dispense Refill   acetaminophen (TYLENOL) 500 MG tablet Take 1,000 mg by mouth 3 (three) times daily as needed.     brimonidine (ALPHAGAN) 0.2 % ophthalmic solution Place 1 drop into the right eye 3 (three) times daily.     Cholecalciferol (VITAMIN D PO) Take by mouth.     Cyanocobalamin (VITAMIN B 12 PO) Take by mouth.     denosumab (PROLIA) 60 MG/ML SOSY injection Inject 60 mg into the skin every 6 (six) months.     esomeprazole (NEXIUM) 20 MG capsule TAKE ONE CAPSULE BY MOUTH EVERY DAY AT NOON 90 capsule 3   No current facility-administered medications on file prior to visit.   Allergies  Allergen Reactions   Triamcinolone Acetonide [Triamcinolone] Other (See Comments)    Red cheeks and nose, elevated heart rate   Amoxicillin Diarrhea    GI upset   Family History  Problem Relation Age  of Onset   Dementia Mother    Depression Mother    Osteoporosis Mother    Stroke Father    Heart attack Father 68   Rectal cancer Maternal Grandmother    Colon cancer Maternal Grandmother    Stroke Sister        TIA. ? dementia. another stroke after covid then feeding issues led to eath   Dementia Sister    Breast cancer Neg Hx     PE: BP 132/84 (BP Location: Right Arm, Patient Position: Sitting, Cuff Size: Normal)   Pulse (!) 44   Ht _0  (1.676 m)   Wt 145 lb (65.8 kg)   LMP  (LMP Unknown)   SpO2 97%   BMI 23.40 kg/m  Wt Readings from Last 3 Encounters:  09/26/22 145 lb (65.8 kg)  09/23/22 143 lb (64.9 kg)  08/24/22 142 lb 6.4 oz (64.6 kg)   Constitutional: normal weight, in NAD Eyes:  no exophthalmos ENT: no neck masses, no cervical lymphadenopathy Cardiovascular: RRR, No MRG, mild B periankle edema Respiratory: CTA B Musculoskeletal: no deformities Skin:no rashes Neurological: + mild tremor with outstretched hands  Assessment: 1. Osteoporosis - she denies fractures but she has a decrease in height from 5 ft 8 in to 5 ft 6.5 in over the years  2.  Vitamin D insufficiency  3.  Elevated HbA1c  Plan: 1. Osteoporosis -Likely age-related/postmenopausal.  She also has family history of osteoporosis -No fractures or falls since last visit -We again discussed about fall precautions especially since she has some instability in her feet due to pain.  Discussed about using toe spacers and memory foam soles (Sketchers). -We reviewed her latest bone density scan from 09/2020: Bone density improved at most sites with the exception of the left hip, which was worse, however, it may  have been related to the fact that the 2019 T score was an outlier, so we discussed about waiting for the new bone density at the end of this year to compare with the last study -Will order the new bone density scan today.  This needs to be obtained after the end of next month -She continues with  her Prolia injections, she had 13 so far, with the last being 06/2022.  She is due for the next injection in 12/2022. -She tolerates Prolia well, without jaw/hip/thigh pain -We can continue Prolia for 10 years or more.  We started in 2016. -After we finish Prolia, we will need to give her 1 to 2 years of bisphosphonates -the new data points towards Reclast this being the best option -We reviewed the results of her BMP and she has normal kidney function.  Vitamin D level was normal at last check -I recommended weightbearing exercises, but at this visit, she continues to only do water walking. -I will see her back in 1 year  2.  Vitamin D insufficiency -vitamin D level was normal at last visit -she is a 2000 units vitamin D daily -We will recheck her vitamin D level today  3.  Elevated HbA1c -This remains in the prediabetic range: Lab Results  Component Value Date   HGBA1C 6.0 03/28/2022   HGBA1C 6.2 12/27/2021   HGBA1C 5.9 (H) 08/27/2021   HGBA1C CANCELED 08/27/2021   HGBA1C 6.0 12/21/2020   HGBA1C 6.2 08/21/2020   HGBA1C 5.7 05/06/2020   HGBA1C 5.7 12/18/2019   HGBA1C 5.9 08/19/2019   HGBA1C 5.9 12/12/2018  -We discussed in the past about cutting down fatty foods like cheese, meat, eggs -We do not need medication for now -We will repeat her HbA1c today   Component     Latest Ref Rng 09/26/2022  VITD     30.00 - 100.00 ng/mL 48.10   Hemoglobin A1C     4.6 - 6.5 % 6.1   HbA1c slightly higher than before, but still within prediabetic range.  Vitamin D is normal.  Date L1-L3 T score FN T score 33% distal Radius Ultra distal radius   10/24/2022 ([facility]) -0.5 RFN: -2.5 LFN: -2.1 -1.9 (-4%) N/a  No comparisons available for the spine and femoral neck T-scores (?).  Philemon Kingdom, MD PhD [facility] Endocrinology

## 2022-10-06 ENCOUNTER — Ambulatory Visit
Admission: RE | Admit: 2022-10-06 | Discharge: 2022-10-06 | Disposition: A | Discharge status: Home / Self Care | Payer: Medicare PPO | Source: Ambulatory Visit | Attending: Obstetrics & Gynecology | Admitting: Obstetrics & Gynecology

## 2022-10-06 DIAGNOSIS — Z853 Personal history of malignant neoplasm of breast: Secondary | ICD-10-CM

## 2022-10-06 DIAGNOSIS — N644 Mastodynia: Secondary | ICD-10-CM

## 2022-10-06 DIAGNOSIS — R92333 Mammographic heterogeneous density, bilateral breasts: Secondary | ICD-10-CM | POA: Diagnosis not present

## 2022-10-24 DIAGNOSIS — M810 Age-related osteoporosis without current pathological fracture: Secondary | ICD-10-CM | POA: Diagnosis not present

## 2022-10-24 DIAGNOSIS — Z780 Asymptomatic menopausal state: Secondary | ICD-10-CM | POA: Diagnosis not present

## 2022-10-24 DIAGNOSIS — M85852 Other specified disorders of bone density and structure, left thigh: Secondary | ICD-10-CM | POA: Diagnosis not present

## 2022-10-24 LAB — HM DEXA SCAN

## 2022-10-25 ENCOUNTER — Encounter: Payer: Self-pay | Admitting: Internal Medicine

## 2022-10-26 ENCOUNTER — Encounter: Payer: Self-pay | Admitting: Obstetrics & Gynecology

## 2022-10-27 ENCOUNTER — Encounter: Payer: Self-pay | Admitting: Family Medicine

## 2022-10-31 ENCOUNTER — Other Ambulatory Visit: Payer: Self-pay

## 2022-10-31 ENCOUNTER — Encounter (HOSPITAL_BASED_OUTPATIENT_CLINIC_OR_DEPARTMENT_OTHER): Payer: Self-pay

## 2022-10-31 ENCOUNTER — Telehealth: Payer: Self-pay | Admitting: Family Medicine

## 2022-10-31 ENCOUNTER — Emergency Department (HOSPITAL_BASED_OUTPATIENT_CLINIC_OR_DEPARTMENT_OTHER)
Admission: EM | Admit: 2022-10-31 | Discharge: 2022-10-31 | Disposition: A | Discharge status: Home / Self Care | Payer: Medicare PPO | Attending: Emergency Medicine | Admitting: Emergency Medicine

## 2022-10-31 DIAGNOSIS — R059 Cough, unspecified: Secondary | ICD-10-CM | POA: Diagnosis present

## 2022-10-31 DIAGNOSIS — J101 Influenza due to other identified influenza virus with other respiratory manifestations: Secondary | ICD-10-CM | POA: Diagnosis not present

## 2022-10-31 DIAGNOSIS — Z20822 Contact with and (suspected) exposure to covid-19: Secondary | ICD-10-CM | POA: Insufficient documentation

## 2022-10-31 DIAGNOSIS — R000 Tachycardia, unspecified: Secondary | ICD-10-CM | POA: Insufficient documentation

## 2022-10-31 LAB — CBC WITH DIFFERENTIAL/PLATELET
Abs Immature Granulocytes: 0.02 10*3/uL (ref 0.00–0.07)
Basophils Absolute: 0 10*3/uL (ref 0.0–0.1)
Basophils Relative: 0 %
Eosinophils Absolute: 0 10*3/uL (ref 0.0–0.5)
Eosinophils Relative: 0 %
HCT: 40.3 % (ref 36.0–46.0)
Hemoglobin: 13.6 g/dL (ref 12.0–15.0)
Immature Granulocytes: 0 %
Lymphocytes Relative: 12 %
Lymphs Abs: 0.8 10*3/uL (ref 0.7–4.0)
MCH: 30.6 pg (ref 26.0–34.0)
MCHC: 33.7 g/dL (ref 30.0–36.0)
MCV: 90.6 fL (ref 80.0–100.0)
Monocytes Absolute: 0.6 10*3/uL (ref 0.1–1.0)
Monocytes Relative: 8 %
Neutro Abs: 5.6 10*3/uL (ref 1.7–7.7)
Neutrophils Relative %: 80 %
Platelets: 309 10*3/uL (ref 150–400)
RBC: 4.45 MIL/uL (ref 3.87–5.11)
RDW: 13.4 % (ref 11.5–15.5)
WBC: 7.1 10*3/uL (ref 4.0–10.5)
nRBC: 0 % (ref 0.0–0.2)

## 2022-10-31 LAB — RESP PANEL BY RT-PCR (FLU A&B, COVID) ARPGX2
Influenza A by PCR: POSITIVE — AB
Influenza B by PCR: NEGATIVE
SARS Coronavirus 2 by RT PCR: NEGATIVE

## 2022-10-31 LAB — COMPREHENSIVE METABOLIC PANEL
ALT: 12 U/L (ref 0–44)
AST: 16 U/L (ref 15–41)
Albumin: 4.9 g/dL (ref 3.5–5.0)
Alkaline Phosphatase: 55 U/L (ref 38–126)
Anion gap: 9 (ref 5–15)
BUN: 17 mg/dL (ref 8–23)
CO2: 28 mmol/L (ref 22–32)
Calcium: 9.7 mg/dL (ref 8.9–10.3)
Chloride: 103 mmol/L (ref 98–111)
Creatinine, Ser: 1.04 mg/dL — ABNORMAL HIGH (ref 0.44–1.00)
GFR, Estimated: 55 mL/min — ABNORMAL LOW (ref >60)
Glucose, Bld: 109 mg/dL — ABNORMAL HIGH (ref 70–99)
Potassium: 4 mmol/L (ref 3.5–5.1)
Sodium: 140 mmol/L (ref 135–145)
Total Bilirubin: 0.3 mg/dL (ref 0.3–1.2)
Total Protein: 7.8 g/dL (ref 6.5–8.1)

## 2022-10-31 LAB — TROPONIN I (HIGH SENSITIVITY): Troponin I (High Sensitivity): 4 ng/L (ref <18)

## 2022-10-31 MED ORDER — IBUPROFEN 400 MG PO TABS
400.0000 mg | ORAL_TABLET | Freq: Once | ORAL | Status: AC
  Administered 2022-10-31: 400 mg via ORAL
  Filled 2022-10-31: qty 1

## 2022-10-31 MED ORDER — SODIUM CHLORIDE 0.9 % IV BOLUS
1000.0000 mL | Freq: Once | INTRAVENOUS | Status: AC
  Administered 2022-10-31: 1000 mL via INTRAVENOUS

## 2022-10-31 MED ORDER — ACETAMINOPHEN 500 MG PO TABS
1000.0000 mg | ORAL_TABLET | Freq: Once | ORAL | Status: AC
  Administered 2022-10-31: 1000 mg via ORAL
  Filled 2022-10-31: qty 2

## 2022-10-31 NOTE — Telephone Encounter (Signed)
  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 They are having a fever with heart rate spikes. Additional Comment Caller is at [phone]. Translation No Nurse Assessment Nurse: Delphina Cahill, RN, Santiago Glad Date/Time Eilene Ghazi Time): 10/31/2022 4:10:20 PM Confirm and document reason for call. If symptomatic, describe symptoms. 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 They are having a fever with heart rate spikes. Started feeling achy this am. Got msg from watch that HR was over 110. Temp 102 orally. Having a little dizziness, dry cough. COVID test was neg. Has been fully vaxed. States her chest feels a little tight. ECG on watch said no AFib but high HR. Does the patient have any new or worsening symptoms? ---Yes Will a triage be completed? ---Yes Related visit to physician within the last 2 weeks? ---No Does the PT have any chronic conditions? (i.e. diabetes, asthma, this includes High risk factors for pregnancy, etc.) ---Yes List chronic conditions. ---Osteoporosis Is this a behavioral health or substance abuse call? ---No  Guidelines Guideline Title Affirmed Question Affirmed Notes Nurse Date/Time Eilene Ghazi Time) Chest Pain [1] Chest pain lasts > 5 minutes AND [2] age > 42 Delphina Cahill, RN, Santiago Glad 10/31/2022 4:14:36 PM Disp. Time  Eilene Ghazi Time) Disposition Final User 10/31/2022 4:20:25 PM Call EMS 911 Now Yes Wandra Arthurs 10/31/2022 4:27:25 PM 911 Outcome Documentation Delphina Cahill, RN, Santiago Glad Reason: Caller refused, asking for office appt. Office contacted and appt being made for tomorrow for her. Final Disposition 10/31/2022 4:20:25 PM Call EMS 911 Now Yes Delphina Cahill, RN, York Pellant Disagree/Comply Disagree Caller Understands Yes PreDisposition InappropriateToAsk Care Advice Given Per Guideline CALL EMS 911 NOW: * Immediate medical attention is needed. You need to hang up and call 911 (or an ambulance). CARE ADVICE given per Chest Pain (Adult) guideline. Comments User: Theophilus Kinds, RN Date/Time Eilene Ghazi Time): 10/31/2022 4:26:46 PM Notified office of 911 refusal, pt requesting an appt for tomorrow. pt with chest tightness, HR 122, dizziness, achy, temp 102. She said she will call the patient back, has an appt for 0815 tomorrow.

## 2022-10-31 NOTE — Discharge Instructions (Addendum)
It was our pleasure to provide your ER care today - we hope that you feel better.  Drink plenty of fluids/stay well hydrated.   Take acetaminophen or ibuprofen as need.   Follow up with your doctor in one week if symptoms fail to improve/resolve.  Return to ER right away if worse, new symptoms, increased trouble breathing, chest pain, or other emergency concern.

## 2022-10-31 NOTE — ED Provider Notes (Signed)
[facility] EMERGENCY DEPT Provider Note   CSN: [id number] Arrival date & time: 10/31/22  1721     History  No chief complaint on file.   Dana Chambers is a 76 y.o. female.  Patient with c/o body aches, fever, non prod cough. Symptoms acute onset in past couple days. No specific known ill contacts. Denies headache. No neck pain or stiffness. No sore throat or trouble swallowing. No abd pain or nvd. No dysuria or gu c/o. No extremity pain or swelling. Somewhat decreased po intake in past day.   The history is provided by the patient, the spouse and medical records.       Home Medications Prior to Admission medications   Medication Sig Start Date End Date Taking? Authorizing Provider  acetaminophen (TYLENOL) 500 MG tablet Take 1,000 mg by mouth 3 (three) times daily as needed.    [provider]  brimonidine (ALPHAGAN) 0.2 % ophthalmic solution Place 1 drop into the right eye 3 (three) times daily. 07/12/22   [provider]  Cholecalciferol (VITAMIN D PO) Take by mouth.    [provider]  Cyanocobalamin (VITAMIN B 12 PO) Take by mouth.    [provider]  denosumab (PROLIA) 60 MG/ML SOSY injection Inject 60 mg into the skin every 6 (six) months.    [provider]  esomeprazole (NEXIUM) 20 MG capsule TAKE ONE CAPSULE BY MOUTH EVERY DAY AT NOON 12/10/21   Marin Olp, MD      Allergies    Triamcinolone acetonide [triamcinolone] and Amoxicillin    Review of Systems   Review of Systems  Constitutional:  Positive for fever.  HENT:  Negative for sore throat.   Eyes:  Negative for redness.  Respiratory:  Positive for cough. Negative for shortness of breath.   Cardiovascular:  Negative for chest pain.  Gastrointestinal:  Negative for abdominal pain, diarrhea and vomiting.  Genitourinary:  Negative for dysuria.  Musculoskeletal:  Positive for myalgias. Negative for neck pain and neck stiffness.  Skin:  Negative for  rash.  Neurological:  Negative for headaches.  Hematological:  Does not bruise/bleed easily.  Psychiatric/Behavioral:  Negative for confusion.     Physical Exam Updated Vital Signs BP (!) 153/85   Pulse (!) 105   Temp 98.7 F (37.1 C) (Oral)   Resp 18   Ht 1.676 m ('5\' 6"'$ )   Wt 63.5 kg   LMP  (LMP Unknown)   SpO2 98%   BMI 22.60 kg/m  Physical Exam Vitals and nursing note reviewed.  Constitutional:      Appearance: Normal appearance. She is well-developed.  HENT:     Head: Atraumatic.     Nose: Nose normal.     Mouth/Throat:     Mouth: Mucous membranes are moist.  Eyes:     General: No scleral icterus.    Conjunctiva/sclera: Conjunctivae normal.     Pupils: Pupils are equal, round, and reactive to light.  Neck:     Trachea: No tracheal deviation.     Comments: No stiffness or rigidity.  Cardiovascular:     Rate and Rhythm: Regular rhythm. Tachycardia present.     Pulses: Normal pulses.     Heart sounds: Normal heart sounds. No murmur heard.    No friction rub. No gallop.  Pulmonary:     Effort: Pulmonary effort is normal. No respiratory distress.     Breath sounds: Normal breath sounds.  Abdominal:     General: Bowel sounds are normal.  There is no distension.     Palpations: Abdomen is soft.     Tenderness: There is no abdominal tenderness.  Genitourinary:    Comments: No cva tenderness.  Musculoskeletal:        General: No swelling.     Cervical back: Normal range of motion and neck supple. No rigidity. No muscular tenderness.     Right lower leg: No edema.     Left lower leg: No edema.  Skin:    General: Skin is warm and dry.     Findings: No rash.  Neurological:     Mental Status: She is alert.     Comments: Alert, speech normal.   Psychiatric:        Mood and Affect: Mood normal.     ED Results / Procedures / Treatments   Labs (all labs ordered are listed, but only abnormal results are displayed) Results for orders placed or performed during the  hospital encounter of 10/31/22  Resp Panel by RT-PCR (Flu A&B, Covid) Anterior Nasal Swab   Specimen: Anterior Nasal Swab  Result Value Ref Range   SARS Coronavirus 2 by RT PCR NEGATIVE NEGATIVE   Influenza A by PCR POSITIVE (A) NEGATIVE   Influenza B by PCR NEGATIVE NEGATIVE  CBC with Differential  Result Value Ref Range   WBC 7.1 4.0 - 10.5 K/uL   RBC 4.45 3.87 - 5.11 MIL/uL   Hemoglobin 13.6 12.0 - 15.0 g/dL   HCT 40.3 36.0 - 46.0 %   MCV 90.6 80.0 - 100.0 fL   MCH 30.6 26.0 - 34.0 pg   MCHC 33.7 30.0 - 36.0 g/dL   RDW 13.4 11.5 - 15.5 %   Platelets 309 150 - 400 K/uL   nRBC 0.0 0.0 - 0.2 %   Neutrophils Relative % 80 %   Neutro Abs 5.6 1.7 - 7.7 K/uL   Lymphocytes Relative 12 %   Lymphs Abs 0.8 0.7 - 4.0 K/uL   Monocytes Relative 8 %   Monocytes Absolute 0.6 0.1 - 1.0 K/uL   Eosinophils Relative 0 %   Eosinophils Absolute 0.0 0.0 - 0.5 K/uL   Basophils Relative 0 %   Basophils Absolute 0.0 0.0 - 0.1 K/uL   Immature Granulocytes 0 %   Abs Immature Granulocytes 0.02 0.00 - 0.07 K/uL  Comprehensive metabolic panel  Result Value Ref Range   Sodium 140 135 - 145 mmol/L   Potassium 4.0 3.5 - 5.1 mmol/L   Chloride 103 98 - 111 mmol/L   CO2 28 22 - 32 mmol/L   Glucose, Bld 109 (H) 70 - 99 mg/dL   BUN 17 8 - 23 mg/dL   Creatinine, Ser 1.04 (H) 0.44 - 1.00 mg/dL   Calcium 9.7 8.9 - 10.3 mg/dL   Total Protein 7.8 6.5 - 8.1 g/dL   Albumin 4.9 3.5 - 5.0 g/dL   AST 16 15 - 41 U/L   ALT 12 0 - 44 U/L   Alkaline Phosphatase 55 38 - 126 U/L   Total Bilirubin 0.3 0.3 - 1.2 mg/dL   GFR, Estimated 55 (L) >60 mL/min   Anion gap 9 5 - 15  Troponin I (High Sensitivity)  Result Value Ref Range   Troponin I (High Sensitivity) 4 <18 ng/L   MM DIAG BREAST TOMO BILATERAL  Result Date: 10/06/2022 CLINICAL DATA:  Patient presents for tenderness within the outer right breast. EXAM: DIGITAL DIAGNOSTIC BILATERAL MAMMOGRAM WITH TOMOSYNTHESIS; ULTRASOUND RIGHT BREAST LIMITED TECHNIQUE:  Bilateral digital diagnostic  mammography and breast tomosynthesis was performed.; Targeted ultrasound examination of the right breast was performed COMPARISON:  Previous exam(s). ACR Breast Density Category c: The breast tissue is heterogeneously dense, which may obscure small masses. FINDINGS: No concerning masses, calcifications or nonsurgical distortion identified within either breast. Targeted ultrasound is performed, showing normal tissue without suspicious mass within the upper-outer quadrant of the right breast. IMPRESSION: No mammographic evidence for malignancy. No suspicious abnormality upper-outer right breast at the site of tenderness. RECOMMENDATION: Continued clinical evaluation for right breast tenderness. Screening mammogram in one year.(Code:SM-B-01Y) I have discussed the findings and recommendations with the patient. If applicable, a reminder letter will be sent to the patient regarding the next appointment. BI-RADS CATEGORY  2: Benign. Electronically Signed   By: Lovey Newcomer M.D.   On: 10/06/2022 09:28   US BREAST LTD UNI RIGHT INC AXILLA  Result Date: 10/06/2022 CLINICAL DATA:  Patient presents for tenderness within the outer right breast. EXAM: DIGITAL DIAGNOSTIC BILATERAL MAMMOGRAM WITH TOMOSYNTHESIS; ULTRASOUND RIGHT BREAST LIMITED TECHNIQUE: Bilateral digital diagnostic mammography and breast tomosynthesis was performed.; Targeted ultrasound examination of the right breast was performed COMPARISON:  Previous exam(s). ACR Breast Density Category c: The breast tissue is heterogeneously dense, which may obscure small masses. FINDINGS: No concerning masses, calcifications or nonsurgical distortion identified within either breast. Targeted ultrasound is performed, showing normal tissue without suspicious mass within the upper-outer quadrant of the right breast. IMPRESSION: No mammographic evidence for malignancy. No suspicious abnormality upper-outer right breast at the site of tenderness.  RECOMMENDATION: Continued clinical evaluation for right breast tenderness. Screening mammogram in one year.(Code:SM-B-01Y) I have discussed the findings and recommendations with the patient. If applicable, a reminder letter will be sent to the patient regarding the next appointment. BI-RADS CATEGORY  2: Benign. Electronically Signed   By: Lovey Newcomer M.D.   On: 10/06/2022 09:28    EKG EKG Interpretation  Date/Time:  Monday October 31 2022 17:33:09 EST Ventricular Rate:  110 PR Interval:  140 QRS Duration: 70 QT Interval:  314 QTC Calculation: 424 R Axis:   15 Text Interpretation: Sinus tachycardia Low voltage QRS Nonspecific T wave abnormality No previous ECGs available Confirmed by Lajean Saver [phone]) on 10/31/2022 5:45:55 PM  Radiology No results found.  Procedures Procedures    Medications Ordered in ED Medications  ibuprofen (ADVIL) tablet 400 mg (has no administration in time range)  acetaminophen (TYLENOL) tablet 1,000 mg (has no administration in time range)    ED Course/ Medical Decision Making/ A&P                           Medical Decision Making Problems Addressed: Influenza A: acute illness or injury with systemic symptoms that poses a threat to life or bodily functions Sinus tachycardia: acute illness or injury  Amount and/or Complexity of Data Reviewed Independent Historian: spouse    Details: hx External Data Reviewed: notes. Labs: ordered. Decision-making details documented in ED Course.  Risk OTC drugs. Prescription drug management. Decision regarding hospitalization.   Iv ns. Continuous pulse ox and cardiac monitoring. Labs ordered/sent. Imaging ordered.   Diff dx includes flu, covid, uri, aki, dehydration, anemia - dispo decision including potential need for admission considered - will get labs and reassess.   Reviewed nursing notes and prior charts for additional history. External reports reviewed. Additional history from: family.   Cardiac  monitor: sinus rhythm, rate 110.  Labs reviewed/interpreted by me - flu positive. Wbc and hgb  normal.   Ibuprofen po. Acetaminophen po. Ns bolus.   Po fluids.   Pt tolerating po.   Pt appears stable for d/c.   Return precautions provided.            Final Clinical Impression(s) / ED Diagnoses Final diagnoses:  None    Rx / DC Orders ED Discharge Orders     None         Lajean Saver, MD 10/31/22 2129

## 2022-10-31 NOTE — ED Triage Notes (Signed)
States she went shopping today and began to feel tired and achy all over. This afternoon states she had a fever 100.2 Reports her phone was alerting her that her heart rate was running high (rate in the 120s) +nausea

## 2022-10-31 NOTE — ED Notes (Signed)
Reviewed AVS/discharge instruction with patient. Time allotted for and all questions answered. Patient is agreeable for d/c and escorted to ed exit by staff.  

## 2022-10-31 NOTE — Telephone Encounter (Signed)
Pt states: -Fever 102 -Dizzy -dry cough -HR spiked to 110 4 times today 10/31/22 when at rest. -Negative covid-19 test at home 10/31/22 -Unsure what to do, would like to speak to on call nurse.   Pt warm transferred Cristie Hem, patient coordinator with PCP Triage / Team Health Nurse. Awaiting follow up triage notes.

## 2022-11-01 ENCOUNTER — Encounter: Payer: Self-pay | Admitting: Family Medicine

## 2022-11-01 ENCOUNTER — Ambulatory Visit: Payer: Medicare PPO | Admitting: Family Medicine

## 2022-11-01 ENCOUNTER — Ambulatory Visit: Payer: Medicare PPO | Admitting: Physician Assistant

## 2022-11-01 VITALS — BP 136/82 | HR 87 | Temp 98.8°F | Ht 66.0 in | Wt 143.6 lb

## 2022-11-01 DIAGNOSIS — J101 Influenza due to other identified influenza virus with other respiratory manifestations: Secondary | ICD-10-CM

## 2022-11-01 MED ORDER — OSELTAMIVIR PHOSPHATE 30 MG PO CAPS: 30.0000 mg | ORAL_CAPSULE | Freq: Two times a day (BID) | ORAL | 0 refills | Status: DC

## 2022-11-01 NOTE — Patient Instructions (Addendum)
Patient will be treated with Tamiflu.adjust for renal function with 30 mg twice daily Prophylaxis for other  patients: yes- husband- will send in through phone note (see below)  Symptomatic treatment with: scratchy throat bothering her- she can trial salt water gargles, sore throat lozenges, tylenol  Finally, we reviewed reasons to return to care including if symptoms worsen or persist or new concerns arise.  For husband- Treat with tamiflu prophylaxis 75 mg daily for 10 days- change to twice daily IF develops symptoms- fever, chills, bodyaches, headaches, cough  Recommended follow up: Return for as needed for new, worsening, persistent symptoms.

## 2022-11-01 NOTE — Telephone Encounter (Signed)
I saw patient today in follow-up for influenza A

## 2022-11-01 NOTE — Progress Notes (Signed)
Phone [phone] In person visit   Subjective:   Dana Chambers is a 76 y.o. year old very pleasant female patient who presents for/with See problem oriented charting Chief Complaint  Patient presents with   Influenza    Pt tested positive for flu yesterday    Past Medical History-  Patient Active Problem List   Diagnosis Date Noted   Hyperlipidemia- february 2020 coronary Calcium score of 0 12/12/2018    Priority: Medium    Vitamin D insufficiency 08/13/2018    Priority: Medium    Elevated glycosylated hemoglobin 08/13/2018    Priority: Medium    Rib pain on left side 03/21/2017    Priority: Medium    Scoliosis 03/21/2017    Priority: Medium    Osteoporosis 06/29/2009    Priority: Medium    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     brimonidine (ALPHAGAN) 0.2 % ophthalmic solution Place 1 drop into the right eye 3 (three) times daily.     Cholecalciferol (VITAMIN D PO) Take by mouth.     Cyanocobalamin (VITAMIN B 12 PO) Take by mouth.     denosumab (PROLIA) 60 MG/ML SOSY injection Inject 60 mg into the skin every 6 (six) months.     esomeprazole (NEXIUM) 20 MG capsule TAKE ONE CAPSULE BY MOUTH EVERY DAY AT NOON 90 capsule 3   oseltamivir (TAMIFLU) 30 MG capsule Take 1 capsule (30 mg total) by mouth 2 (two) times daily. 10 capsule 0   No current facility-administered medications for this visit.     Objective:  BP 136/82 (BP Location: Right Arm, Patient Position: Sitting)   Pulse 87   Temp 98.8 F (37.1 C) (Temporal)   Ht '5\' 6"'$   (1.676 m)   Wt 143 lb 9.6 oz (65.1 kg)   LMP  (LMP Unknown)   SpO2 97%   BMI 23.18 kg/m  Gen: NAD, resting comfortably Tympanic membrane's normal, nasal turbinates largely normal, pharynx mildly erythematous with some slight drainage CV: RRR no murmurs rubs or gallops Lungs: CTAB no crackles, wheeze, rhonchi Abdomen: soft/nontender/nondistended/normal bowel sounds. No rebound or guarding.  Ext: no edema Skin: warm, dry     Assessment and Plan   # influenza A S: We received a call yesterday the patient was experiencing palpitations, dizziness, shortness of breath associated with fever to 102 as well as tachycardia over 110. - Had already tested negative for COVID at time of call.  Had not been tested for the flu. -She reported some chest tightness as well -Reading through the triage note at this time does not appear shortness of breath was noted -Also had some dry cough  In the emergency department she also reported body aches with onset about 2 days prior-she was treated with ibuprofen and Tylenol and gave return precautions-outside of timeframe for Tamiflu. Received bag of fluid -She tested positive for influenza A.  Negative for COVID.  With chest tightness troponin was ordered and thankfully was low risk.  CBC and CMP were  reassuring-perhaps mild dehydration with slightly worsening renal function from baseline but pretty similar to 2 months ago  Hr and fever much better today- peak temperature 98.8 and HR not over 100  -started: about 25 hours ago -inside 48 hour treatment window if needed for tamiflu: yes -high risk condition (children <5, adults >65, chronic pulmonary or cardiac condition, immunosuppression, pregnancy, nursing home resident, morbid obesity) : yes- age -symptoms are improving slightly -previous treatments: acetaminophen, ibuprofen - patient did  receive flu shot this year.  - positive sick contact; specifically influenza: yes on Saturday- In retrospect today  went to an educator meeting on Saturday- had spent several hours together- symptoms started yesterday with body aches around lunchtime  ROS-denies SOB, NVD, sinus or dental pain A/P:  Flu-like illness: Influenza A History and exam today are suggestive of viral process. Patients influenza test was positive in ED yesterday.  Pretest probability of influenza was moderate. High risk condition: age >48  Patient will be treated with Tamiflu.adjust for renal function with 30 mg twice daily Prophylaxis for other  patients: yes- husband- will send in through phone note Symptomatic treatment with: scratchy throat bothering her- she can trial salt water gargles, sore throat lozenges, tylenol  Finally, we reviewed reasons to return to care including if symptoms worsen or persist or new concerns arise.  Recommended follow up: Return for as needed for new, worsening, persistent symptoms. Future Appointments  Date Time Provider [place]  01/03/2023 10:00 AM Marin Olp, MD [facility]  01/18/2023 10:00 AM [facility] NURSE [facility] None  03/06/2023  8:30 AM [facility] HEALTH COACH [facility]  09/26/2023  9:00 AM Princess Bruins, MD [facility] None  09/27/2023 10:00 AM Philemon Kingdom, MD [facility] None   Lab/Order associations:   ICD-10-CM   1. Influenza A  J10.1       Meds ordered this encounter  Medications   oseltamivir (TAMIFLU) 30 MG capsule    Sig: Take 1 capsule (30 mg total) by mouth 2 (two) times daily.    Dispense:  10 capsule    Refill:  0    Crcl 41 in ED    Time Spent: 25 minutes of total time (12:05 PM-12:30 PM) was spent on the date of the encounter performing the following actions: chart review prior to seeing the patient, obtaining history, performing a medically necessary exam, counseling on the treatment plan-particularly discussing potential benefits of Tamiflu in her age group in fact with timing she would still be eligible, placing orders, and  documenting in our EHR.    Return precautions advised.  Garret Reddish, MD

## 2022-11-10 NOTE — Telephone Encounter (Signed)
Results received and results/recommendations was relayed to pt by radiologist @ [facility]. Will close encounter.

## 2022-11-16 ENCOUNTER — Telehealth: Payer: Self-pay

## 2022-11-16 NOTE — Telephone Encounter (Addendum)
        Patient  visited [facility] on 10/31/2022  for Influenza, sinus tachycardia.   Telephone encounter attempt :  1st  A HIPAA compliant voice message was left requesting a return call.  Instructed patient to call back at [phone].   [place] Resource Care Guide   [email or web address]  ?? [phone]   Website: [email or web address]  [email or web address]

## 2022-11-17 ENCOUNTER — Telehealth: Payer: Self-pay

## 2022-11-17 NOTE — Telephone Encounter (Signed)
     Patient  visit on 10/31/2022  at [facility] was for Influenza, sinus tachycardia.  Have you been able to follow up with your primary care physician? Patient saw Dr. Yong Channel.  The patient was or was not able to obtain any needed medicine or equipment. Patient obtained medication.  Are there diet recommendations that you are having difficulty following? Patient is on regular diet.  Patient expresses understanding of discharge instructions and education provided has no other needs at this time.    [place] Resource Care Guide   ??[email or web address]  ?? [phone]   Website: [email or web address]  [email or web address]

## 2022-12-06 NOTE — Telephone Encounter (Signed)
Prolia VOB initiated via [email or web address]  Last OV: 09/26/2022 Next OV:  Last Prolia inj 07/15/22 Next Prolia inj due 01/16/23

## 2022-12-09 ENCOUNTER — Other Ambulatory Visit: Payer: Self-pay | Admitting: Family Medicine

## 2022-12-26 NOTE — Telephone Encounter (Signed)
Prior auth renewal required for PROLIA  PA PROCESS DETAILS: PA is required. PA can be initiated by calling [phone] or online at [email or web address]   

## 2022-12-30 NOTE — Telephone Encounter (Signed)
Prior Authorization initiated for PROLIA via [email or web address] KEY[place]ly [place], [place] Case ID #: [id number]

## 2023-01-03 ENCOUNTER — Ambulatory Visit (INDEPENDENT_AMBULATORY_CARE_PROVIDER_SITE_OTHER): Payer: Medicare PPO | Admitting: Family Medicine

## 2023-01-03 ENCOUNTER — Encounter: Payer: Self-pay | Admitting: Family Medicine

## 2023-01-03 VITALS — BP 120/80 | HR 80 | Temp 97.0°F | Ht 66.0 in | Wt 143.8 lb

## 2023-01-03 DIAGNOSIS — E049 Nontoxic goiter, unspecified: Secondary | ICD-10-CM

## 2023-01-03 DIAGNOSIS — R7309 Other abnormal glucose: Secondary | ICD-10-CM | POA: Diagnosis not present

## 2023-01-03 DIAGNOSIS — E785 Hyperlipidemia, unspecified: Secondary | ICD-10-CM | POA: Diagnosis not present

## 2023-01-03 DIAGNOSIS — E538 Deficiency of other specified B group vitamins: Secondary | ICD-10-CM

## 2023-01-03 DIAGNOSIS — Z0000 Encounter for general adult medical examination without abnormal findings: Secondary | ICD-10-CM | POA: Diagnosis not present

## 2023-01-03 DIAGNOSIS — I7300 Raynaud's syndrome without gangrene: Secondary | ICD-10-CM | POA: Diagnosis not present

## 2023-01-03 DIAGNOSIS — E559 Vitamin D deficiency, unspecified: Secondary | ICD-10-CM

## 2023-01-03 LAB — LIPID PANEL
Cholesterol: 307 mg/dL — ABNORMAL HIGH (ref 0–200)
HDL: 78.1 mg/dL (ref >39.00)
LDL Cholesterol: 207 mg/dL — ABNORMAL HIGH (ref 0–99)
NonHDL: 229.1
Total CHOL/HDL Ratio: 4
Triglycerides: 110 mg/dL (ref 0.0–149.0)
VLDL: 22 mg/dL (ref 0.0–40.0)

## 2023-01-03 LAB — COMPREHENSIVE METABOLIC PANEL
ALT: 12 U/L (ref 0–35)
AST: 18 U/L (ref 0–37)
Albumin: 4.9 g/dL (ref 3.5–5.2)
Alkaline Phosphatase: 60 U/L (ref 39–117)
BUN: 16 mg/dL (ref 6–23)
CO2: 28 mEq/L (ref 19–32)
Calcium: 9.6 mg/dL (ref 8.4–10.5)
Chloride: 102 mEq/L (ref 96–112)
Creatinine, Ser: 0.85 mg/dL (ref 0.40–1.20)
GFR: 65.68 mL/min (ref >60.00)
Glucose, Bld: 101 mg/dL — ABNORMAL HIGH (ref 70–99)
Potassium: 5.5 mEq/L — ABNORMAL HIGH (ref 3.5–5.1)
Sodium: 140 mEq/L (ref 135–145)
Total Bilirubin: 0.5 mg/dL (ref 0.2–1.2)
Total Protein: 7.3 g/dL (ref 6.0–8.3)

## 2023-01-03 LAB — CBC WITH DIFFERENTIAL/PLATELET
Basophils Absolute: 0 10*3/uL (ref 0.0–0.1)
Basophils Relative: 0.5 % (ref 0.0–3.0)
Eosinophils Absolute: 0.1 10*3/uL (ref 0.0–0.7)
Eosinophils Relative: 0.9 % (ref 0.0–5.0)
HCT: 41.6 % (ref 36.0–46.0)
Hemoglobin: 13.9 g/dL (ref 12.0–15.0)
Lymphocytes Relative: 38.2 % (ref 12.0–46.0)
Lymphs Abs: 2.6 10*3/uL (ref 0.7–4.0)
MCHC: 33.5 g/dL (ref 30.0–36.0)
MCV: 91.4 fl (ref 78.0–100.0)
Monocytes Absolute: 0.5 10*3/uL (ref 0.1–1.0)
Monocytes Relative: 7.5 % (ref 3.0–12.0)
Neutro Abs: 3.6 10*3/uL (ref 1.4–7.7)
Neutrophils Relative %: 52.9 % (ref 43.0–77.0)
Platelets: 317 10*3/uL (ref 150.0–400.0)
RBC: 4.55 Mil/uL (ref 3.87–5.11)
RDW: 13.7 % (ref 11.5–15.5)
WBC: 6.8 10*3/uL (ref 4.0–10.5)

## 2023-01-03 LAB — HEMOGLOBIN A1C: Hgb A1c MFr Bld: 6 % (ref 4.6–6.5)

## 2023-01-03 LAB — TSH: TSH: 1.28 u[IU]/mL (ref 0.35–5.50)

## 2023-01-03 LAB — VITAMIN B12: Vitamin B-12: 824 pg/mL (ref 211–911)

## 2023-01-03 LAB — VITAMIN D 25 HYDROXY (VIT D DEFICIENCY, FRACTURES): VITD: 47.18 ng/mL (ref 30.00–100.00)

## 2023-01-03 NOTE — Progress Notes (Signed)
Phone [phone]   Subjective:  Patient presents today for their annual physical. Chief complaint-noted.   See problem oriented charting- ROS- full  review of systems was completed and negative except for: if back bothers her (seen Dr. Paulla Fore in past)- can get chills and or sweating, hearing loss and tinnitus- stable- declines hearing test, visual isuses even after cataract surgery, palpitations at times sometimes after eating or drinking- wine or caffeine can trigger- no worsening since influenza (if worse she agrees to follow up with [place]), with stairs occasional mild shortness of breath , no chest pain, left foot with hammertoe and corn, check tsh with mild cold intolerance, left foot hammer toe and corn (seen Dr. Paulla Dolly and gets pedicure), joint pain, back pain, neck pain neck stiffness, occasional mild memory issues but nothing substantial   The following were reviewed and entered/updated in epic: Past Medical History:  Diagnosis Date   Basal cell carcinoma of skin    left ankle, right thigh   Bursitis    right hip   Cancer ([place]) 2006   Breast/right   COVID 01/27/2021   Diverticulosis    GERD (gastroesophageal reflux disease)    History of breast cancer/33 radiation treatments/ no chemo    2006  S/P RIGHT LUMPECTOMY AND RADIATION FOLLOWED BY DR Truddie Coco--  NO RECURRENCE   History of kidney stones    Hyperplastic colon polyp    IBS (irritable bowel syndrome)    Idiopathic peripheral neuropathy    bilateral tingling/ due to fall in [facility]   Internal hemorrhoid    Kidney stone    Leiomyoma    Multiple small   Liver cyst 2006   Benign   OA (osteoarthritis)    Osteoporosis 2019   T score -2.5   Personal history of radiation therapy    Raynaud disease    Renal calculus, left    non obstructive   Rib pain on left side    able to lay on left side   Rosacea    Tinnitus    Patient Active Problem List   Diagnosis Date Noted   Raynaud's disease 01/03/2023    Priority: Medium     Hyperlipidemia- february 2020 coronary Calcium score of 0 12/12/2018    Priority: Medium    Vitamin D insufficiency 08/13/2018    Priority: Medium    Elevated glycosylated hemoglobin 08/13/2018    Priority: Medium    Rib pain on left side 03/21/2017    Priority: Medium    Scoliosis 03/21/2017    Priority: Medium    Osteoporosis 06/29/2009    Priority: Medium    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    Priority: Medium    GERD 05/01/1999    Priority: Medium    Hearing loss associated with syndrome of both ears 12/05/2016    Priority: Low   Neuropathy 03/06/2012    Priority: Low   COLONIC POLYPS 05/25/2006    Priority: Low   Past Surgical History:  Procedure Laterality Date   BREAST BIOPSY Left 2016   benign   BREAST EXCISIONAL BIOPSY Right 1973   benign   BREAST LUMPECTOMY  2006   Right -- S/P RADIATION--  NO RECURRENCE   CATARACT EXTRACTION     DILATION AND CURETTAGE OF UTERUS     EXTRACORPOREAL SHOCK WAVE LITHOTRIPSY     X2   HYSTEROSCOPY     HYSTEROSCOPY WITH D & C  08/03/2012   Procedure: DILATATION AND CURETTAGE /HYSTEROSCOPY;  Surgeon: Belinda Block  Fontaine, MD;  Location: [place] [facility];  Service: Gynecology;  Laterality: N/A;   SUPRANUMERARY NIPPLE EXCISION  1973   CYST   TONSILLECTOMY  AGE 34    Family History  Problem Relation Age of Onset   Dementia Mother    Depression Mother    Osteoporosis Mother    Stroke Father    Heart attack Father 44   Rectal cancer Maternal Grandmother    Colon cancer Maternal Grandmother    Stroke Sister        TIA. ? dementia. another stroke after covid then feeding issues led to eath   Dementia Sister    Breast cancer Neg Hx     Medications- reviewed and updated Current Outpatient Medications  Medication Sig Dispense Refill   acetaminophen (TYLENOL) 500 MG tablet Take 1,000 mg by mouth 3 (three) times daily as needed.     brimonidine (ALPHAGAN) 0.2 % ophthalmic solution Place 1 drop into the  right eye 3 (three) times daily.     Cholecalciferol (VITAMIN D PO) Take by mouth.     Cyanocobalamin (VITAMIN B 12 PO) Take by mouth.     denosumab (PROLIA) 60 MG/ML SOSY injection Inject 60 mg into the skin every 6 (six) months.     esomeprazole (NEXIUM) 20 MG capsule TAKE ONE CAPSULE BY MOUTH EVERY DAY AT NOON 90 capsule 3   oseltamivir (TAMIFLU) 30 MG capsule Take 1 capsule (30 mg total) by mouth 2 (two) times daily. 10 capsule 0   No current facility-administered medications for this visit.    Allergies-reviewed and updated Allergies  Allergen Reactions   Triamcinolone Acetonide [Triamcinolone] Other (See Comments)    Red cheeks and nose, elevated heart rate   Amoxicillin Diarrhea    GI upset    Social History   Social History Narrative   Married (husband patient of Dr. Yong Channel). 1 daughter. 1 granddaughter.       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       Hobbies: reading, travel, time with granddaughter. Very active   Objective  Objective:  BP 120/80   Pulse 80   Temp (!) 97 F (36.1 C)   Ht '5\' 6"'$  (1.676 m)   Wt 143 lb 12.8 oz (65.2 kg)   LMP  (LMP Unknown)   BMI 23.21 kg/m  Gen: NAD, resting comfortably HEENT: Mucous membranes are moist. Oropharynx normal Neck: no thyromegaly CV: RRR no murmurs rubs or gallops Lungs: CTAB no crackles, wheeze, rhonchi Abdomen: soft/nontender/nondistended/normal bowel sounds. No rebound or guarding.  Ext: no edema Skin: warm, dry Neuro: grossly normal, moves all extremities, PERRLA   Assessment and Plan   77 y.o. female presenting for annual physical.  Health Maintenance counseling: 1. Anticipatory guidance: Patient counseled regarding regular dental exams -q6 months, eye exams - yearly,  avoiding smoking and second hand smoke , limiting alcohol to 1 beverage per day- 6 a week , no illicit drugs .   2. Risk factor reduction:  Advised patient of need for regular exercise and diet rich and fruits and vegetables to reduce risk of  heart attack and stroke.  Exercise- still doing water walking.  Diet/weight management-reasonably stable. Within a few lbs last may. Reasonably healthy in general  Wt Readings from Last 3 Encounters:  01/03/23 143 lb 12.8 oz (65.2 kg)  11/01/22 143 lb 9.6 oz (65.1 kg)  10/31/22 140 lb (63.5 kg)  3. Immunizations/screenings/ancillary studies-discussed Tdap at pharmacy, does not need Tdap  Immunization History  Administered Date(s)  Administered   Fluad Quad(high Dose 65+) 07/29/2020, 08/12/2022   Influenza Whole 10/29/2009, 08/11/2010, 08/19/2011   Influenza, High Dose Seasonal PF 09/29/2016, 08/21/2017, 08/01/2018, 07/15/2019, 07/29/2020, 07/21/2021, 08/19/2022   Influenza,inj,Quad PF,6+ Mos 08/21/2013, 09/02/2014   Influenza-Unspecified 09/09/2015, 08/01/2018, 07/17/2019   PFIZER Comirnaty(Gray Top)Covid-19 Tri-Sucrose Vaccine 04/08/2021, 08/19/2022   PFIZER(Purple Top)SARS-COV-2 Vaccination 12/23/2019, 01/13/2020, 07/31/2020   Pfizer Covid-19 Vaccine Bivalent Booster 65yr & up 08/11/2021, 04/06/2022   Pneumococcal Conjugate-13 09/02/2014   Pneumococcal Polysaccharide-23 03/06/2012   Respiratory Syncytial Virus Vaccine,Recomb Aduvanted(Arexvy) 08/19/2022   Tdap 03/06/2012   Zoster Recombinat (Shingrix) 03/10/2018, 05/10/2018    4. Cervical cancer screening- past formal age based screening recommendations.  No vaginal bleeding or discharge noted. Still seeing Dr. LDellis Filbertnow  5. Breast cancer screening-  breast exam-self exams monthly and sees GYN  and mammogram  10/06/2022 with 1 year repeat planned-  History of Paget's disease.  6. Colon cancer screening -no further colonoscopy per GI after examination 02/17/2022 7. Skin cancer screening- -followed by dermatology Dr. SKandis Fantasiadermatology in February- a few spots she wants looked at . advised regular sunscreen use. advised regular sunscreen use. D 8. Birth control/STD check- - postmenopausal and monogamous  9. Osteoporosis screening  at 89 DEXA November 2023 with Dr. GCruzita Lederer- see discussion below for osteoporosis -Never smoker  Status of chronic or acute concerns   #hyperlipidemia-patient with history of coronary calcium score of 0 on 01/16/2019. S: Medication: None at present  with family history patient was opted to take rosuvastatin 10 mg once a week in the past with incredible improvement of LDL from over 200-113. She came off statin due to increased a1c A/P: will update lipids- discussed updating ct calcium scoring next year. Does have occasional palpitations or shortness of breath with stairs- if worsens she is going ot let me know asap. No chest pain.   # GERD S:Medication: Nexium 20 mg-has required 40 mg in the past. Has to be careful with meals -With osteoporosis history prefer lowest dose possible  B12 levels related to PPI use: Low normal in 2019 and empirically takes B12 now A/P: reaosnably stable- continue current medicines    # Hyperglycemia/insulin resistance/prediabetes-peak A1c of 6.2 in the past-increased while on statin S:  Medication: none Lab Results  Component Value Date   HGBA1C 6.1 09/26/2022   HGBA1C 6.0 03/28/2022   HGBA1C 6.2 12/27/2021  A/P: hopefully stable or improved- update a1c today. Continue current meds for now  # Osteoporosis-follows with Dr. GCruzita LedererS: Last DEXA: 10/19/2020 with slight worsening in left femur neck  Medication: Prolia through Dr. GCruzita Lederer Calcium: '1200mg'$  (through diet ok) recommended  Vitamin D: 1000 units a day recommended-vitamin D has been ok- lhistory of low Last vitamin D Lab Results  Component Value Date   VD25OH 48.10 09/26/2022  A/P: reasonably stable- continue follow up with Dr. GCruzita Ledererand current meds   #Multinodular goiter-noted on ultrasound Apr 21, 2021-no areas met criteria for biopsy. - Patient reports no change in symptoms - Check TSH at least annually including today   #Musculoskeletal concerns-was referred in January 2022 to Dr.  RPaulla Forefor left upper back pain-later with neck pain/left arm tingling -has seen him in the past and prefers to follow-up with him if possible if needed- but more tolerable lately but variabl -Physical therapy in 2022 seemed to worsen symptoms  #Left hammer toe with callous on underside- has seen Dr. RPaulla Dollyand gets pared down with pedicure q6 weeks. Wears gel cover.  Recommended follow up: Return in about 6 months (around 07/04/2023) for followup or sooner if needed.Schedule b4 you leave. Future Appointments  Date Time Provider [place]  01/18/2023 10:00 AM [facility] NURSE [facility] None  03/06/2023  8:30 AM [facility] HEALTH COACH [facility]  09/26/2023  9:00 AM Princess Bruins, MD [facility] None  09/27/2023 10:00 AM Philemon Kingdom, MD [facility] None   Lab/Order associations:NOT fasting   ICD-10-CM   1. Preventative health care  Z00.00     2. Elevated glycosylated hemoglobin  R73.09 HgB A1c    3. Hyperlipidemia, unspecified hyperlipidemia type  E78.5 CBC with Differential/Platelet    Comprehensive metabolic panel    Lipid panel    4. Vitamin D insufficiency  E55.9 VITAMIN D 25 Hydroxy (Vit-D Deficiency, Fractures)    5. Goiter  E04.9 TSH    6. Raynaud's disease without gangrene  I73.00     7. Low serum vitamin B12  E53.8 Vitamin B12      No orders of the defined types were placed in this encounter.   Return precautions advised.  Garret Reddish, MD

## 2023-01-03 NOTE — Patient Instructions (Addendum)
You are eligible to schedule your annual wellness visit with our nurse specialist Otila Kluver.  Please consider scheduling this before you leave today  Tdap recommended at the pharmacy  Please stop by lab before you go If you have mychart- we will send your results within 3 business days of [place] receiving them.  If you do not have mychart- we will call you about results within 5 business days of [place] receiving them.  *please also note that you will see labs on mychart as soon as they post. I will later go in and write notes on them- will say "notes from Dr. Yong Channel"   Recommended follow up: Return in about 6 months (around 07/04/2023) for followup or sooner if needed.Schedule b4 you leave. And schedule 1 year physical

## 2023-01-04 ENCOUNTER — Other Ambulatory Visit: Payer: Self-pay

## 2023-01-04 DIAGNOSIS — E875 Hyperkalemia: Secondary | ICD-10-CM

## 2023-01-04 NOTE — Telephone Encounter (Signed)
PA# [id number] Valid: 05/15/20-11/28/23

## 2023-01-05 NOTE — Telephone Encounter (Signed)
Pt ready for scheduling on or after 01/16/23  Out-of-pocket cost due at time of visit: $0  Primary: [employer] Offerle Keller Prolia co-insurance: 0% Admin fee co-insurance: 0%   Secondary: n/a Prolia co-insurance:  Admin fee co-insurance:   Deductible: does not apply  Prior Auth: APPROVED PA# [id number] Valid: 05/15/20-11/28/23      ** This summary of benefits is an estimation of the patient's out-of-pocket cost. Exact cost may very based on individual plan coverage.

## 2023-01-06 ENCOUNTER — Other Ambulatory Visit (INDEPENDENT_AMBULATORY_CARE_PROVIDER_SITE_OTHER): Payer: Medicare PPO

## 2023-01-06 DIAGNOSIS — E875 Hyperkalemia: Secondary | ICD-10-CM

## 2023-01-06 LAB — BASIC METABOLIC PANEL
BUN: 14 mg/dL (ref 6–23)
CO2: 29 mEq/L (ref 19–32)
Calcium: 9.4 mg/dL (ref 8.4–10.5)
Chloride: 102 mEq/L (ref 96–112)
Creatinine, Ser: 0.88 mg/dL (ref 0.40–1.20)
GFR: 63 mL/min (ref >60.00)
Glucose, Bld: 92 mg/dL (ref 70–99)
Potassium: 5.5 mEq/L — ABNORMAL HIGH (ref 3.5–5.1)
Sodium: 141 mEq/L (ref 135–145)

## 2023-01-07 ENCOUNTER — Encounter: Payer: Self-pay | Admitting: Family Medicine

## 2023-01-09 ENCOUNTER — Other Ambulatory Visit: Payer: Self-pay

## 2023-01-09 ENCOUNTER — Other Ambulatory Visit (INDEPENDENT_AMBULATORY_CARE_PROVIDER_SITE_OTHER): Payer: Medicare PPO

## 2023-01-09 DIAGNOSIS — E875 Hyperkalemia: Secondary | ICD-10-CM

## 2023-01-09 DIAGNOSIS — L718 Other rosacea: Secondary | ICD-10-CM | POA: Diagnosis not present

## 2023-01-09 DIAGNOSIS — L814 Other melanin hyperpigmentation: Secondary | ICD-10-CM | POA: Diagnosis not present

## 2023-01-09 DIAGNOSIS — L821 Other seborrheic keratosis: Secondary | ICD-10-CM | POA: Diagnosis not present

## 2023-01-09 DIAGNOSIS — D225 Melanocytic nevi of trunk: Secondary | ICD-10-CM | POA: Diagnosis not present

## 2023-01-09 DIAGNOSIS — D2261 Melanocytic nevi of right upper limb, including shoulder: Secondary | ICD-10-CM | POA: Diagnosis not present

## 2023-01-09 DIAGNOSIS — D2271 Melanocytic nevi of right lower limb, including hip: Secondary | ICD-10-CM | POA: Diagnosis not present

## 2023-01-09 DIAGNOSIS — Z85828 Personal history of other malignant neoplasm of skin: Secondary | ICD-10-CM | POA: Diagnosis not present

## 2023-01-09 LAB — BASIC METABOLIC PANEL
BUN: 18 mg/dL (ref 6–23)
CO2: 30 mEq/L (ref 19–32)
Calcium: 9.9 mg/dL (ref 8.4–10.5)
Chloride: 103 mEq/L (ref 96–112)
Creatinine, Ser: 0.9 mg/dL (ref 0.40–1.20)
GFR: 61.32 mL/min (ref >60.00)
Glucose, Bld: 90 mg/dL (ref 70–99)
Potassium: 5 mEq/L (ref 3.5–5.1)
Sodium: 141 mEq/L (ref 135–145)

## 2023-01-18 ENCOUNTER — Ambulatory Visit (INDEPENDENT_AMBULATORY_CARE_PROVIDER_SITE_OTHER): Payer: Medicare PPO

## 2023-01-18 VITALS — BP 128/74 | HR 80 | Ht 66.0 in | Wt 142.4 lb

## 2023-01-18 DIAGNOSIS — M818 Other osteoporosis without current pathological fracture: Secondary | ICD-10-CM

## 2023-01-18 MED ORDER — DENOSUMAB 60 MG/ML ~~LOC~~ SOSY
60.0000 mg | PREFILLED_SYRINGE | Freq: Once | SUBCUTANEOUS | Status: AC
  Administered 2023-01-18: 60 mg via SUBCUTANEOUS

## 2023-01-18 NOTE — Progress Notes (Signed)
Patient verbally confirmed name, date of birth, and correct medication to be administered. Prolia injection administered and pt tolerated well.  

## 2023-02-20 NOTE — Telephone Encounter (Signed)
Last Prolia inj 01/18/23 Next Prolia inj due 07/20/23 

## 2023-03-06 ENCOUNTER — Ambulatory Visit (INDEPENDENT_AMBULATORY_CARE_PROVIDER_SITE_OTHER): Payer: Medicare PPO

## 2023-03-06 VITALS — BP 120/80 | HR 82 | Temp 97.5°F | Wt 142.6 lb

## 2023-03-06 DIAGNOSIS — Z0000 Encounter for general adult medical examination without abnormal findings: Secondary | ICD-10-CM | POA: Diagnosis not present

## 2023-03-06 NOTE — Progress Notes (Signed)
Subjective:   Dana Chambers is a 77 y.o. female who presents for Medicare Annual (Subsequent) preventive examination.     Patient Medicare AWV questionnaire was completed by the patient on 03/02/23; I have confirmed that all information answered by patient is correct and no changes since this date.     Review of Systems     Cardiac Risk Factors include: advanced age (>57men, >52 women);dyslipidemia     Objective:    Today's Vitals   03/06/23 0820  BP: 120/80  Pulse: 82  Temp: (!) 97.5 F (36.4 C)  SpO2: 99%  Weight: 142 lb 9.6 oz (64.7 kg)   Body mass index is 23.02 kg/m.     03/06/2023    8:32 AM 02/21/2022    8:47 AM 02/21/2021   10:01 PM 02/09/2021    1:31 PM 01/28/2021    8:18 AM 06/22/2018    1:35 PM 08/10/2017    8:14 AM  Advanced Directives  Does Patient Have a Medical Advance Directive? Yes Yes Yes Yes Yes Yes Yes  Type of Estate agent of [place];Living will Healthcare Power of [employer] Power of [employer] Power of Attorney Healthcare Power of Attorney  Living will  Does patient want to make changes to medical advance directive? No - Patient declined        Copy of Healthcare Power of Attorney in Chart? Yes - validated most recent copy scanned in chart (See row information) Yes - validated most recent copy scanned in chart (See row information) Yes - validated most recent copy scanned in chart (See row information) Yes - validated most recent copy scanned in chart (See row information) Yes - validated most recent copy scanned in chart (See row information)      Current Medications (verified) Outpatient Encounter Medications as of 03/06/2023  Medication Sig   acetaminophen (TYLENOL) 500 MG tablet Take 1,000 mg by mouth 3 (three) times daily as needed.   Cholecalciferol (VITAMIN D PO) Take by mouth.   Cyanocobalamin (VITAMIN B 12 PO) Take by mouth.   denosumab (PROLIA) 60 MG/ML SOSY injection Inject 60 mg into the skin every  6 (six) months.   esomeprazole (NEXIUM) 20 MG capsule TAKE ONE CAPSULE BY MOUTH EVERY DAY AT NOON   No facility-administered encounter medications on file as of 03/06/2023.    Allergies (verified) Triamcinolone acetonide [triamcinolone] and Amoxicillin   History: Past Medical History:  Diagnosis Date   Basal cell carcinoma of skin    left ankle, right thigh   Bursitis    right hip   Cancer 2006   Breast/right   COVID 01/27/2021   Diverticulosis    GERD (gastroesophageal reflux disease)    History of breast cancer/33 radiation treatments/ no chemo    2006  S/P RIGHT LUMPECTOMY AND RADIATION FOLLOWED BY DR RUBIN--  NO RECURRENCE   History of kidney stones    Hyperplastic colon polyp    IBS (irritable bowel syndrome)    Idiopathic peripheral neuropathy    bilateral tingling/ due to fall in [facility]   Internal hemorrhoid    Kidney stone    Leiomyoma    Multiple small   Liver cyst 2006   Benign   OA (osteoarthritis)    Osteoporosis 2019   T score -2.5   Personal history of radiation therapy    Raynaud disease    Renal calculus, left    non obstructive   Rib pain on left side    able to  lay on left side   Rosacea    Tinnitus    Past Surgical History:  Procedure Laterality Date   BREAST BIOPSY Left 2016   benign   BREAST EXCISIONAL BIOPSY Right 1973   benign   BREAST LUMPECTOMY  2006   Right -- S/P RADIATION--  NO RECURRENCE   CATARACT EXTRACTION     DILATION AND CURETTAGE OF UTERUS     EXTRACORPOREAL SHOCK WAVE LITHOTRIPSY     X2   HYSTEROSCOPY     HYSTEROSCOPY WITH D & C  08/03/2012   Procedure: DILATATION AND CURETTAGE /HYSTEROSCOPY;  Surgeon: Dara Lords, MD;  Location: [facility];  Service: Gynecology;  Laterality: N/A;   SUPRANUMERARY NIPPLE EXCISION  1973   CYST   TONSILLECTOMY  AGE 12   Family History  Problem Relation Age of Onset   Dementia Mother    Depression Mother    Osteoporosis Mother    Stroke Father    Heart attack  Father 74   Rectal cancer Maternal Grandmother    Colon cancer Maternal Grandmother    Stroke Sister        TIA. ? dementia. another stroke after covid then feeding issues led to eath   Dementia Sister    Breast cancer Neg Hx    Social History   Socioeconomic History   Marital status: Married    Spouse name: Not on file   Number of children: 1   Years of education: Not on file   Highest education level: Not on file  Occupational History   Occupation: Retired-Teacher  Tobacco Use   Smoking status: Never   Smokeless tobacco: Never  Vaping Use   Vaping Use: Never used  Substance and Sexual Activity   Alcohol use: Yes    Alcohol/week: 7.0 standard drinks of alcohol    Types: 7 Standard drinks or equivalent per week   Drug use: No   Sexual activity: Not Currently    Birth control/protection: Post-menopausal    Comment: 1st intercourse 77 yo-Fewer than 5 partners  Other Topics Concern   Not on file  Social History Narrative   Married (husband patient of Dr. Durene Cal). 1 daughter. 1 granddaughter.       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       Hobbies: reading, travel, time with granddaughter. Very active   Social Determinants of Health   Financial Resource Strain: Low Risk  (03/02/2023)   Overall Financial Resource Strain (CARDIA)    Difficulty of Paying Living Expenses: Not hard at all  Food Insecurity: No Food Insecurity (03/02/2023)   Hunger Vital Sign    Worried About Running Out of Food in the Last Year: Never true    Ran Out of Food in the Last Year: Never true  Transportation Needs: No Transportation Needs (03/02/2023)   PRAPARE - Administrator, Civil Service (Medical): No    Lack of Transportation (Non-Medical): No  Physical Activity: Insufficiently Active (03/02/2023)   Exercise Vital Sign    Days of Exercise per Week: 2 days    Minutes of Exercise per Session: 50 min  Stress: No Stress Concern Present (03/02/2023)   [employer] of Occupational  Health - Occupational Stress Questionnaire    Feeling of Stress : Only a little  Social Connections: Socially Integrated (03/02/2023)   Social Connection and Isolation Panel [NHANES]    Frequency of Communication with Friends and Family: More than three times a week    Frequency of  Social Gatherings with Friends and Family: Three times a week    Attends Religious Services: More than 4 times per year    Active Member of Clubs or Organizations: Yes    Attends Engineer, structural: More than 4 times per year    Marital Status: Married    Tobacco Counseling Counseling given: Not Answered   Clinical Intake:  Pre-visit preparation completed: Yes  Pain : No/denies pain     BMI - recorded: 23.02 Nutritional Status: BMI of 19-24  Normal Nutritional Risks: None Diabetes: No  How often do you need to have someone help you when you read instructions, pamphlets, or other written materials from your doctor or pharmacy?: 1 - Never  Diabetic?no  Interpreter Needed?: No  Information entered by :: Lanier Ensign, LPN   Activities of Daily Living    03/02/2023   11:11 AM  In your present state of health, do you have any difficulty performing the following activities:  Hearing? 1  Comment tinnitus and slight hearing loss  Vision? 0  Difficulty concentrating or making decisions? 0  Walking or climbing stairs? 0  Dressing or bathing? 0  Doing errands, shopping? 0  Preparing Food and eating ? N  Using the Toilet? N  In the past six months, have you accidently leaked urine? Y  Comment weard a brief or pad  Do you have problems with loss of bowel control? N  Managing your Medications? N  Managing your Finances? N  Housekeeping or managing your Housekeeping? N    Patient Care Team: Shelva Majestic, MD as PCP - General (Family Medicine) Venancio Poisson, MD as Consulting Physician (Dermatology) Carlus Pavlov, MD as Consulting Physician (Endocrinology) Fontaine, Nadyne Coombes,  MD (Inactive) as Consulting Physician (Gynecology) Napoleon Form, MD as Consulting Physician (Gastroenterology) Mateo Flow, MD as Consulting Physician (Ophthalmology)  Indicate any recent Medical Services you may have received from other than [facility] providers in the past year (date may be approximate).     Assessment:   This is a routine wellness examination for [facility].  Hearing/Vision screen Hearing Screening - Comments:: Pt stated slight hearing loss and tinnitus  Vision Screening - Comments:: Pt follows up with Dr Elmer Picker for annual; eye exams   Dietary issues and exercise activities discussed: Current Exercise Habits: Home exercise routine, Type of exercise: Other - see comments (walking water aerobics), Time (Minutes): 50, Frequency (Times/Week): 2, Weekly Exercise (Minutes/Week): 100   Goals Addressed             This Visit's Progress    Patient Stated       Work on lower cholesterol and blood sugar        Depression Screen    03/06/2023    8:30 AM 01/03/2023    9:49 AM 02/21/2022    8:45 AM 12/27/2021    8:37 AM 02/26/2021    9:18 AM 01/28/2021    8:14 AM 12/21/2020   10:45 AM  PHQ 2/9 Scores  PHQ - 2 Score 0 0 0 0 0 0 0  PHQ- 9 Score  0  1       Fall Risk    03/02/2023   11:11 AM 01/03/2023    9:49 AM 02/21/2022    8:48 AM 12/27/2021    8:38 AM 01/28/2021    8:20 AM  Fall Risk   Falls in the past year? 0 0 0 0 1  Number falls in past yr: 0 0 0 0 1  Injury  with Fall? 0 0 0 0 1  Comment     bruises and injured ear  Risk for fall due to : Impaired vision;Impaired balance/gait No Fall Risks Impaired vision  Impaired vision;Impaired balance/gait  Follow up Falls prevention discussed Falls evaluation completed Falls prevention discussed  Falls prevention discussed    FALL RISK PREVENTION PERTAINING TO THE HOME:  Any stairs in or around the home? Yes  If so, are there any without handrails? No  Home free of loose throw rugs in walkways, pet beds, electrical  cords, etc? Yes  Adequate lighting in your home to reduce risk of falls? Yes   ASSISTIVE DEVICES UTILIZED TO PREVENT FALLS:  Life alert? No  Use of a cane, walker or w/c? No  Grab bars in the bathroom? No  Shower chair or bench in shower? No  Elevated toilet seat or a handicapped toilet? No   TIMED UP AND GO:  Was the test performed? Yes .  Length of time to ambulate 10 feet: 10 sec.   Gait steady and fast without use of assistive device  Cognitive Function:        02/21/2022    8:50 AM 01/28/2021    8:23 AM  6CIT Screen  What Year? 0 points 0 points  What month? 0 points 0 points  What time? 0 points   Count back from 20 0 points 0 points  Months in reverse 0 points 0 points  Repeat phrase 0 points 0 points  Total Score 0 points     Immunizations Immunization History  Administered Date(s) Administered   Fluad Quad(high Dose 65+) 07/29/2020, 08/12/2022   Influenza Whole 10/29/2009, 08/11/2010, 08/19/2011   Influenza, High Dose Seasonal PF 09/29/2016, 08/21/2017, 08/01/2018, 07/15/2019, 07/29/2020, 07/21/2021, 08/19/2022   Influenza,inj,Quad PF,6+ Mos 08/21/2013, 09/02/2014   Influenza-Unspecified 09/09/2015, 08/01/2018, 07/17/2019   PFIZER Comirnaty(Gray Top)Covid-19 Tri-Sucrose Vaccine 04/08/2021, 08/19/2022   PFIZER(Purple Top)SARS-COV-2 Vaccination 12/23/2019, 01/13/2020, 07/31/2020   Pfizer Covid-19 Vaccine Bivalent Booster 54yrs & up 08/11/2021, 04/06/2022   Pneumococcal Conjugate-13 09/02/2014   Pneumococcal Polysaccharide-23 03/06/2012   Respiratory Syncytial Virus Vaccine,Recomb Aduvanted(Arexvy) 08/19/2022   Tdap 03/06/2012   Zoster Recombinat (Shingrix) 03/10/2018, 05/10/2018    TDAP status: Due, Education has been provided regarding the importance of this vaccine. Advised may receive this vaccine at local pharmacy or Health Dept. Aware to provide a copy of the vaccination record if obtained from local pharmacy or Health Dept. Verbalized acceptance and  understanding.  Flu Vaccine status: Up to date  Pneumococcal vaccine status: Up to date  Covid-19 vaccine status: Completed vaccines  Qualifies for Shingles Vaccine? Yes   Zostavax completed Yes   Shingrix Completed?: Yes  Screening Tests Health Maintenance  Topic Date Due   DTaP/Tdap/Td (2 - Td or Tdap) 03/06/2022   COVID-19 Vaccine (8 - 2023-24 season) 10/14/2022   INFLUENZA VACCINE  06/29/2023   MAMMOGRAM  10/07/2023   Medicare Annual Wellness (AWV)  03/05/2024   Pneumonia Vaccine 90+ Years old  Completed   DEXA SCAN  Completed   Hepatitis C Screening  Completed   Zoster Vaccines- Shingrix  Completed   HPV VACCINES  Aged Out   COLONOSCOPY (Pts 45-74yrs Insurance coverage will need to be confirmed)  Discontinued    Health Maintenance  Health Maintenance Due  Topic Date Due   DTaP/Tdap/Td (2 - Td or Tdap) 03/06/2022   COVID-19 Vaccine (8 - 2023-24 season) 10/14/2022    Colorectal cancer screening: Type of screening: Colonoscopy. Completed 02/17/22. Repeat every as directed  years  Mammogram status: Completed 10/06/22. Repeat every year  Bone Density status: Completed 10/24/22. Results reflect: Bone density results: OSTEOPOROSIS. Repeat every 2 years.   Additional Screening:  Hepatitis C Screening:  Completed 12/06/17  Vision Screening: Recommended annual ophthalmology exams for early detection of glaucoma and other disorders of the eye. Is the patient up to date with their annual eye exam?  Yes  Who is the provider or what is the name of the office in which the patient attends annual eye exams? Dr Elmer PickerHecker  If pt is not established with a provider, would they like to be referred to a provider to establish care? No .   Dental Screening: Recommended annual dental exams for proper oral hygiene  Community Resource Referral / Chronic Care Management: CRR required this visit?  No   CCM required this visit?  No      Plan:     I have personally reviewed and noted  the following in the patient's chart:   Medical and social history Use of alcohol, tobacco or illicit drugs  Current medications and supplements including opioid prescriptions. Patient is not currently taking opioid prescriptions. Functional ability and status Nutritional status Physical activity Advanced directives List of other physicians Hospitalizations, surgeries, and ER visits in previous 12 months Vitals Screenings to include cognitive, depression, and falls Referrals and appointments  In addition, I have reviewed and discussed with patient certain preventive protocols, quality metrics, and best practice recommendations. A written personalized care plan for preventive services as well as general preventive health recommendations were provided to patient.     Marzella Schleinina H Shaan Rhoads, LPN   4/0/98114/06/2023   Nurse Notes: none

## 2023-03-06 NOTE — Patient Instructions (Signed)
Dana Chambers , Thank you for taking time to come for your Medicare Wellness Visit. I appreciate your ongoing commitment to your health goals. Please review the following plan we discussed and let me know if I can assist you in the future.   These are the goals we discussed:  Goals      Exercise 150 minutes per week (moderate activity)     Continue with physical exercise;  Could use another day of gym; will walk more but will explore other     Patient Stated     Better diet to lower chol Plant based diet  will get hearing checked Possibly try to come off Nexium     Patient Stated     Continue exercising more      Patient Stated     Eat a better diet         This is a list of the screening recommended for you and due dates:  Health Maintenance  Topic Date Due   DTaP/Tdap/Td vaccine (2 - Td or Tdap) 03/06/2022   COVID-19 Vaccine (8 - 2023-24 season) 10/14/2022   Flu Shot  06/29/2023   Mammogram  10/07/2023   Medicare Annual Wellness Visit  03/05/2024   Pneumonia Vaccine  Completed   DEXA scan (bone density measurement)  Completed   Hepatitis C Screening: USPSTF Recommendation to screen - Ages 18-79 yo.  Completed   Zoster (Shingles) Vaccine  Completed   HPV Vaccine  Aged Out   Colon Cancer Screening  Discontinued    Advanced directives: copies in chart   Conditions/risks identified: lower blood sugar and cholesterol   Next appointment: Follow up in one year for your annual wellness visit    Preventive Care 65 Years and Older, Female Preventive care refers to lifestyle choices and visits with your health care provider that can promote health and wellness. What does preventive care include? A yearly physical exam. This is also called an annual well check. Dental exams once or twice a year. Routine eye exams. Ask your health care provider how often you should have your eyes checked. Personal lifestyle choices, including: Daily care of your teeth and gums. Regular  physical activity. Eating a healthy diet. Avoiding tobacco and drug use. Limiting alcohol use. Practicing safe sex. Taking low-dose aspirin every day. Taking vitamin and mineral supplements as recommended by your health care provider. What happens during an annual well check? The services and screenings done by your health care provider during your annual well check will depend on your age, overall health, lifestyle risk factors, and family history of disease. Counseling  Your health care provider may ask you questions about your: Alcohol use. Tobacco use. Drug use. Emotional well-being. Home and relationship well-being. Sexual activity. Eating habits. History of falls. Memory and ability to understand (cognition). Work and work Astronomer. Reproductive health. Screening  You may have the following tests or measurements: Height, weight, and BMI. Blood pressure. Lipid and cholesterol levels. These may be checked every 5 years, or more frequently if you are over 30 years old. Skin check. Lung cancer screening. You may have this screening every year starting at age 59 if you have a 30-pack-year history of smoking and currently smoke or have quit within the past 15 years. Fecal occult blood test (FOBT) of the stool. You may have this test every year starting at age 61. Flexible sigmoidoscopy or colonoscopy. You may have a sigmoidoscopy every 5 years or a colonoscopy every 10 years starting at age  50. Hepatitis C blood test. Hepatitis B blood test. Sexually transmitted disease (STD) testing. Diabetes screening. This is done by checking your blood sugar (glucose) after you have not eaten for a while (fasting). You may have this done every 1-3 years. Bone density scan. This is done to screen for osteoporosis. You may have this done starting at age 67. Mammogram. This may be done every 1-2 years. Talk to your health care provider about how often you should have regular mammograms. Talk  with your health care provider about your test results, treatment options, and if necessary, the need for more tests. Vaccines  Your health care provider may recommend certain vaccines, such as: Influenza vaccine. This is recommended every year. Tetanus, diphtheria, and acellular pertussis (Tdap, Td) vaccine. You may need a Td booster every 10 years. Zoster vaccine. You may need this after age 23. Pneumococcal 13-valent conjugate (PCV13) vaccine. One dose is recommended after age 58. Pneumococcal polysaccharide (PPSV23) vaccine. One dose is recommended after age 82. Talk to your health care provider about which screenings and vaccines you need and how often you need them. This information is not intended to replace advice given to you by your health care provider. Make sure you discuss any questions you have with your health care provider. Document Released: 12/11/2015 Document Revised: 08/03/2016 Document Reviewed: 09/15/2015 Elsevier Interactive Patient Education  2017 [place] Prevention in the Home Falls can cause injuries. They can happen to people of all ages. There are many things you can do to make your home safe and to help prevent falls. What can I do on the outside of my home? Regularly fix the edges of walkways and driveways and fix any cracks. Remove anything that might make you trip as you walk through a door, such as a raised step or threshold. Trim any bushes or trees on the path to your home. Use bright outdoor lighting. Clear any walking paths of anything that might make someone trip, such as rocks or tools. Regularly check to see if handrails are loose or broken. Make sure that both sides of any steps have handrails. Any raised decks and porches should have guardrails on the edges. Have any leaves, snow, or ice cleared regularly. Use sand or salt on walking paths during winter. Clean up any spills in your garage right away. This includes oil or grease  spills. What can I do in the bathroom? Use night lights. Install grab bars by the toilet and in the tub and shower. Do not use towel bars as grab bars. Use non-skid mats or decals in the tub or shower. If you need to sit down in the shower, use a plastic, non-slip stool. Keep the floor dry. Clean up any water that spills on the floor as soon as it happens. Remove soap buildup in the tub or shower regularly. Attach bath mats securely with double-sided non-slip rug tape. Do not have throw rugs and other things on the floor that can make you trip. What can I do in the bedroom? Use night lights. Make sure that you have a light by your bed that is easy to reach. Do not use any sheets or blankets that are too big for your bed. They should not hang down onto the floor. Have a firm chair that has side arms. You can use this for support while you get dressed. Do not have throw rugs and other things on the floor that can make you trip. What can I do  in the kitchen? Clean up any spills right away. Avoid walking on wet floors. Keep items that you use a lot in easy-to-reach places. If you need to reach something above you, use a strong step stool that has a grab bar. Keep electrical cords out of the way. Do not use floor polish or wax that makes floors slippery. If you must use wax, use non-skid floor wax. Do not have throw rugs and other things on the floor that can make you trip. What can I do with my stairs? Do not leave any items on the stairs. Make sure that there are handrails on both sides of the stairs and use them. Fix handrails that are broken or loose. Make sure that handrails are as long as the stairways. Check any carpeting to make sure that it is firmly attached to the stairs. Fix any carpet that is loose or worn. Avoid having throw rugs at the top or bottom of the stairs. If you do have throw rugs, attach them to the floor with carpet tape. Make sure that you have a light switch at the  top of the stairs and the bottom of the stairs. If you do not have them, ask someone to add them for you. What else can I do to help prevent falls? Wear shoes that: Do not have high heels. Have rubber bottoms. Are comfortable and fit you well. Are closed at the toe. Do not wear sandals. If you use a stepladder: Make sure that it is fully opened. Do not climb a closed stepladder. Make sure that both sides of the stepladder are locked into place. Ask someone to hold it for you, if possible. Clearly mark and make sure that you can see: Any grab bars or handrails. First and last steps. Where the edge of each step is. Use tools that help you move around (mobility aids) if they are needed. These include: Canes. Walkers. Scooters. Crutches. Turn on the lights when you go into a dark area. Replace any light bulbs as soon as they burn out. Set up your furniture so you have a clear path. Avoid moving your furniture around. If any of your floors are uneven, fix them. If there are any pets around you, be aware of where they are. Review your medicines with your doctor. Some medicines can make you feel dizzy. This can increase your chance of falling. Ask your doctor what other things that you can do to help prevent falls. This information is not intended to replace advice given to you by your health care provider. Make sure you discuss any questions you have with your health care provider. Document Released: 09/10/2009 Document Revised: 04/21/2016 Document Reviewed: 12/19/2014 Elsevier Interactive Patient Education  2017 [employer].

## 2023-03-13 NOTE — Progress Notes (Signed)
Subjective:   Dana Chambers is a 77 y.o. female who presents for Medicare Annual (Subsequent) preventive examination.     Patient Medicare AWV questionnaire was completed by the patient on 03/02/23; I have confirmed that all information answered by patient is correct and no changes since this date.     Review of Systems     Cardiac Risk Factors include: advanced age (>57men, >52 women);dyslipidemia     Objective:    Today's Vitals   03/06/23 0820  BP: 120/80  Pulse: 82  Temp: (!) 97.5 F (36.4 C)  SpO2: 99%  Weight: 142 lb 9.6 oz (64.7 kg)   Body mass index is 23.02 kg/m.     03/06/2023    8:32 AM 02/21/2022    8:47 AM 02/21/2021   10:01 PM 02/09/2021    1:31 PM 01/28/2021    8:18 AM 06/22/2018    1:35 PM 08/10/2017    8:14 AM  Advanced Directives  Does Patient Have a Medical Advance Directive? Yes Yes Yes Yes Yes Yes Yes  Type of Estate agent of [place];Living will Healthcare Power of [employer] Power of [employer] Power of Attorney Healthcare Power of Attorney  Living will  Does patient want to make changes to medical advance directive? No - Patient declined        Copy of Healthcare Power of Attorney in Chart? Yes - validated most recent copy scanned in chart (See row information) Yes - validated most recent copy scanned in chart (See row information) Yes - validated most recent copy scanned in chart (See row information) Yes - validated most recent copy scanned in chart (See row information) Yes - validated most recent copy scanned in chart (See row information)      Current Medications (verified) Outpatient Encounter Medications as of 03/06/2023  Medication Sig   acetaminophen (TYLENOL) 500 MG tablet Take 1,000 mg by mouth 3 (three) times daily as needed.   Cholecalciferol (VITAMIN D PO) Take by mouth.   Cyanocobalamin (VITAMIN B 12 PO) Take by mouth.   denosumab (PROLIA) 60 MG/ML SOSY injection Inject 60 mg into the skin every  6 (six) months.   esomeprazole (NEXIUM) 20 MG capsule TAKE ONE CAPSULE BY MOUTH EVERY DAY AT NOON   No facility-administered encounter medications on file as of 03/06/2023.    Allergies (verified) Triamcinolone acetonide [triamcinolone] and Amoxicillin   History: Past Medical History:  Diagnosis Date   Basal cell carcinoma of skin    left ankle, right thigh   Bursitis    right hip   Cancer 2006   Breast/right   COVID 01/27/2021   Diverticulosis    GERD (gastroesophageal reflux disease)    History of breast cancer/33 radiation treatments/ no chemo    2006  S/P RIGHT LUMPECTOMY AND RADIATION FOLLOWED BY DR RUBIN--  NO RECURRENCE   History of kidney stones    Hyperplastic colon polyp    IBS (irritable bowel syndrome)    Idiopathic peripheral neuropathy    bilateral tingling/ due to fall in [facility]   Internal hemorrhoid    Kidney stone    Leiomyoma    Multiple small   Liver cyst 2006   Benign   OA (osteoarthritis)    Osteoporosis 2019   T score -2.5   Personal history of radiation therapy    Raynaud disease    Renal calculus, left    non obstructive   Rib pain on left side    able to  lay on left side   Rosacea    Tinnitus    Past Surgical History:  Procedure Laterality Date   BREAST BIOPSY Left 2016   benign   BREAST EXCISIONAL BIOPSY Right 1973   benign   BREAST LUMPECTOMY  2006   Right -- S/P RADIATION--  NO RECURRENCE   CATARACT EXTRACTION     DILATION AND CURETTAGE OF UTERUS     EXTRACORPOREAL SHOCK WAVE LITHOTRIPSY     X2   HYSTEROSCOPY     HYSTEROSCOPY WITH D & C  08/03/2012   Procedure: DILATATION AND CURETTAGE /HYSTEROSCOPY;  Surgeon: Dara Lords, MD;  Location: [facility];  Service: Gynecology;  Laterality: N/A;   SUPRANUMERARY NIPPLE EXCISION  1973   CYST   TONSILLECTOMY  AGE 12   Family History  Problem Relation Age of Onset   Dementia Mother    Depression Mother    Osteoporosis Mother    Stroke Father    Heart attack  Father 74   Rectal cancer Maternal Grandmother    Colon cancer Maternal Grandmother    Stroke Sister        TIA. ? dementia. another stroke after covid then feeding issues led to eath   Dementia Sister    Breast cancer Neg Hx    Social History   Socioeconomic History   Marital status: Married    Spouse name: Not on file   Number of children: 1   Years of education: Not on file   Highest education level: Not on file  Occupational History   Occupation: Retired-Teacher  Tobacco Use   Smoking status: Never   Smokeless tobacco: Never  Vaping Use   Vaping Use: Never used  Substance and Sexual Activity   Alcohol use: Yes    Alcohol/week: 7.0 standard drinks of alcohol    Types: 7 Standard drinks or equivalent per week   Drug use: No   Sexual activity: Not Currently    Birth control/protection: Post-menopausal    Comment: 1st intercourse 77 yo-Fewer than 5 partners  Other Topics Concern   Not on file  Social History Narrative   Married (husband patient of Dr. Durene Cal). 1 daughter. 1 granddaughter.       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       Hobbies: reading, travel, time with granddaughter. Very active   Social Determinants of Health   Financial Resource Strain: Low Risk  (03/02/2023)   Overall Financial Resource Strain (CARDIA)    Difficulty of Paying Living Expenses: Not hard at all  Food Insecurity: No Food Insecurity (03/02/2023)   Hunger Vital Sign    Worried About Running Out of Food in the Last Year: Never true    Ran Out of Food in the Last Year: Never true  Transportation Needs: No Transportation Needs (03/02/2023)   PRAPARE - Administrator, Civil Service (Medical): No    Lack of Transportation (Non-Medical): No  Physical Activity: Insufficiently Active (03/02/2023)   Exercise Vital Sign    Days of Exercise per Week: 2 days    Minutes of Exercise per Session: 50 min  Stress: No Stress Concern Present (03/02/2023)   [employer] of Occupational  Health - Occupational Stress Questionnaire    Feeling of Stress : Only a little  Social Connections: Socially Integrated (03/02/2023)   Social Connection and Isolation Panel [NHANES]    Frequency of Communication with Friends and Family: More than three times a week    Frequency of  Social Gatherings with Friends and Family: Three times a week    Attends Religious Services: More than 4 times per year    Active Member of Clubs or Organizations: Yes    Attends Engineer, structural: More than 4 times per year    Marital Status: Married    Tobacco Counseling Counseling given: Not Answered   Clinical Intake:  Pre-visit preparation completed: Yes  Pain : No/denies pain     BMI - recorded: 23.02 Nutritional Status: BMI of 19-24  Normal Nutritional Risks: None Diabetes: No  How often do you need to have someone help you when you read instructions, pamphlets, or other written materials from your doctor or pharmacy?: 1 - Never  Diabetic?no  Interpreter Needed?: No  Information entered by :: Lanier Ensign, LPN   Activities of Daily Living    03/02/2023   11:11 AM  In your present state of health, do you have any difficulty performing the following activities:  Hearing? 1  Comment tinnitus and slight hearing loss  Vision? 0  Difficulty concentrating or making decisions? 0  Walking or climbing stairs? 0  Dressing or bathing? 0  Doing errands, shopping? 0  Preparing Food and eating ? N  Using the Toilet? N  In the past six months, have you accidently leaked urine? Y  Comment weard a brief or pad  Do you have problems with loss of bowel control? N  Managing your Medications? N  Managing your Finances? N  Housekeeping or managing your Housekeeping? N    Patient Care Team: Shelva Majestic, MD as PCP - General (Family Medicine) Venancio Poisson, MD as Consulting Physician (Dermatology) Carlus Pavlov, MD as Consulting Physician (Endocrinology) Fontaine, Nadyne Coombes,  MD (Inactive) as Consulting Physician (Gynecology) Napoleon Form, MD as Consulting Physician (Gastroenterology) Mateo Flow, MD as Consulting Physician (Ophthalmology)  Indicate any recent Medical Services you may have received from other than [facility] providers in the past year (date may be approximate).     Assessment:   This is a routine wellness examination for [facility].  Hearing/Vision screen Hearing Screening - Comments:: Pt stated slight hearing loss and tinnitus  Vision Screening - Comments:: Pt follows up with Dr Elmer Picker for annual; eye exams   Dietary issues and exercise activities discussed: Current Exercise Habits: Home exercise routine, Type of exercise: Other - see comments (walking water aerobics), Time (Minutes): 50, Frequency (Times/Week): 2, Weekly Exercise (Minutes/Week): 100   Goals Addressed             This Visit's Progress    Patient Stated       Work on lower cholesterol and blood sugar       Depression Screen    03/06/2023    8:30 AM 01/03/2023    9:49 AM 02/21/2022    8:45 AM 12/27/2021    8:37 AM 02/26/2021    9:18 AM 01/28/2021    8:14 AM 12/21/2020   10:45 AM  PHQ 2/9 Scores  PHQ - 2 Score 0 0 0 0 0 0 0  PHQ- 9 Score  0  1       Fall Risk    03/02/2023   11:11 AM 01/03/2023    9:49 AM 02/21/2022    8:48 AM 12/27/2021    8:38 AM 01/28/2021    8:20 AM  Fall Risk   Falls in the past year? 0 0 0 0 1  Number falls in past yr: 0 0 0 0 1  Injury with  Fall? 0 0 0 0 1  Comment     bruises and injured ear  Risk for fall due to : Impaired vision;Impaired balance/gait No Fall Risks Impaired vision  Impaired vision;Impaired balance/gait  Follow up Falls prevention discussed Falls evaluation completed Falls prevention discussed  Falls prevention discussed    FALL RISK PREVENTION PERTAINING TO THE HOME:  Any stairs in or around the home? Yes  If so, are there any without handrails? No  Home free of loose throw rugs in walkways, pet beds, electrical cords,  etc? Yes  Adequate lighting in your home to reduce risk of falls? Yes   ASSISTIVE DEVICES UTILIZED TO PREVENT FALLS:  Life alert? No  Use of a cane, walker or w/c? No  Grab bars in the bathroom? No  Shower chair or bench in shower? No  Elevated toilet seat or a handicapped toilet? No   TIMED UP AND GO:  Was the test performed? Yes .  Length of time to ambulate 10 feet: 10 sec.   Gait steady and fast without use of assistive device  Cognitive Function:        03/13/2023    8:29 AM 02/21/2022    8:50 AM 01/28/2021    8:23 AM  6CIT Screen  What Year? 0 points 0 points 0 points  What month? 0 points 0 points 0 points  What time? 0 points 0 points   Count back from 20 0 points 0 points 0 points  Months in reverse 0 points 0 points 0 points  Repeat phrase 0 points 0 points 0 points  Total Score 0 points 0 points     Immunizations Immunization History  Administered Date(s) Administered   Fluad Quad(high Dose 65+) 07/29/2020, 08/12/2022   Influenza Whole 10/29/2009, 08/11/2010, 08/19/2011   Influenza, High Dose Seasonal PF 09/29/2016, 08/21/2017, 08/01/2018, 07/15/2019, 07/29/2020, 07/21/2021, 08/19/2022   Influenza,inj,Quad PF,6+ Mos 08/21/2013, 09/02/2014   Influenza-Unspecified 09/09/2015, 08/01/2018, 07/17/2019   PFIZER Comirnaty(Gray Top)Covid-19 Tri-Sucrose Vaccine 04/08/2021, 08/19/2022   PFIZER(Purple Top)SARS-COV-2 Vaccination 12/23/2019, 01/13/2020, 07/31/2020   Pfizer Covid-19 Vaccine Bivalent Booster 49yrs & up 08/11/2021, 04/06/2022   Pneumococcal Conjugate-13 09/02/2014   Pneumococcal Polysaccharide-23 03/06/2012   Respiratory Syncytial Virus Vaccine,Recomb Aduvanted(Arexvy) 08/19/2022   Tdap 03/06/2012   Zoster Recombinat (Shingrix) 03/10/2018, 05/10/2018    TDAP status: Due, Education has been provided regarding the importance of this vaccine. Advised may receive this vaccine at local pharmacy or Health Dept. Aware to provide a copy of the vaccination record  if obtained from local pharmacy or Health Dept. Verbalized acceptance and understanding.  Flu Vaccine status: Up to date  Pneumococcal vaccine status: Up to date  Covid-19 vaccine status: Completed vaccines  Qualifies for Shingles Vaccine? Yes   Zostavax completed Yes   Shingrix Completed?: Yes  Screening Tests Health Maintenance  Topic Date Due   DTaP/Tdap/Td (2 - Td or Tdap) 03/06/2022   COVID-19 Vaccine (8 - 2023-24 season) 10/14/2022   INFLUENZA VACCINE  06/29/2023   MAMMOGRAM  10/07/2023   Medicare Annual Wellness (AWV)  03/05/2024   Pneumonia Vaccine 76+ Years old  Completed   DEXA SCAN  Completed   Hepatitis C Screening  Completed   Zoster Vaccines- Shingrix  Completed   HPV VACCINES  Aged Out   COLONOSCOPY (Pts 45-73yrs Insurance coverage will need to be confirmed)  Discontinued    Health Maintenance  Health Maintenance Due  Topic Date Due   DTaP/Tdap/Td (2 - Td or Tdap) 03/06/2022   COVID-19 Vaccine (8 -  2023-24 season) 10/14/2022    Colorectal cancer screening: Type of screening: Colonoscopy. Completed 02/17/22. Repeat every as directed  years  Mammogram status: Completed 10/06/22. Repeat every year  Bone Density status: Completed 10/24/22. Results reflect: Bone density results: OSTEOPOROSIS. Repeat every 2 years.   Additional Screening:  Hepatitis C Screening:  Completed 12/06/17  Vision Screening: Recommended annual ophthalmology exams for early detection of glaucoma and other disorders of the eye. Is the patient up to date with their annual eye exam?  Yes  Who is the provider or what is the name of the office in which the patient attends annual eye exams? Dr Elmer Picker  If pt is not established with a provider, would they like to be referred to a provider to establish care? No .   Dental Screening: Recommended annual dental exams for proper oral hygiene  Community Resource Referral / Chronic Care Management: CRR required this visit?  No   CCM required  this visit?  No      Plan:     I have personally reviewed and noted the following in the patient's chart:   Medical and social history Use of alcohol, tobacco or illicit drugs  Current medications and supplements including opioid prescriptions. Patient is not currently taking opioid prescriptions. Functional ability and status Nutritional status Physical activity Advanced directives List of other physicians Hospitalizations, surgeries, and ER visits in previous 12 months Vitals Screenings to include cognitive, depression, and falls Referrals and appointments  In addition, I have reviewed and discussed with patient certain preventive protocols, quality metrics, and best practice recommendations. A written personalized care plan for preventive services as well as general preventive health recommendations were provided to patient.     Marzella Schlein, LPN   [id number]   Nurse Notes: none

## 2023-03-31 DIAGNOSIS — H40013 Open angle with borderline findings, low risk, bilateral: Secondary | ICD-10-CM | POA: Diagnosis not present

## 2023-03-31 DIAGNOSIS — H26493 Other secondary cataract, bilateral: Secondary | ICD-10-CM | POA: Diagnosis not present

## 2023-03-31 DIAGNOSIS — H18593 Other hereditary corneal dystrophies, bilateral: Secondary | ICD-10-CM | POA: Diagnosis not present

## 2023-03-31 DIAGNOSIS — H04123 Dry eye syndrome of bilateral lacrimal glands: Secondary | ICD-10-CM | POA: Diagnosis not present

## 2023-06-07 NOTE — Telephone Encounter (Signed)
Prolia VOB initiated via [email or web address]   Next Prolia inj DUE: 07/20/23

## 2023-06-15 ENCOUNTER — Encounter: Payer: Self-pay | Admitting: Family Medicine

## 2023-06-15 DIAGNOSIS — E785 Hyperlipidemia, unspecified: Secondary | ICD-10-CM

## 2023-06-15 DIAGNOSIS — R7309 Other abnormal glucose: Secondary | ICD-10-CM

## 2023-06-15 DIAGNOSIS — E559 Vitamin D deficiency, unspecified: Secondary | ICD-10-CM

## 2023-06-15 DIAGNOSIS — Z131 Encounter for screening for diabetes mellitus: Secondary | ICD-10-CM

## 2023-06-19 ENCOUNTER — Other Ambulatory Visit: Payer: Medicare PPO

## 2023-06-22 ENCOUNTER — Other Ambulatory Visit (INDEPENDENT_AMBULATORY_CARE_PROVIDER_SITE_OTHER): Payer: Medicare PPO

## 2023-06-22 DIAGNOSIS — Z131 Encounter for screening for diabetes mellitus: Secondary | ICD-10-CM | POA: Diagnosis not present

## 2023-06-22 DIAGNOSIS — E785 Hyperlipidemia, unspecified: Secondary | ICD-10-CM | POA: Diagnosis not present

## 2023-06-22 DIAGNOSIS — R7309 Other abnormal glucose: Secondary | ICD-10-CM | POA: Diagnosis not present

## 2023-06-22 DIAGNOSIS — E559 Vitamin D deficiency, unspecified: Secondary | ICD-10-CM | POA: Diagnosis not present

## 2023-06-22 DIAGNOSIS — E875 Hyperkalemia: Secondary | ICD-10-CM

## 2023-06-22 LAB — CBC WITH DIFFERENTIAL/PLATELET
Basophils Absolute: 0 10*3/uL (ref 0.0–0.1)
Basophils Relative: 0.7 % (ref 0.0–3.0)
Eosinophils Absolute: 0.3 10*3/uL (ref 0.0–0.7)
Eosinophils Relative: 4.7 % (ref 0.0–5.0)
HCT: 42.9 % (ref 36.0–46.0)
Hemoglobin: 14.1 g/dL (ref 12.0–15.0)
Lymphocytes Relative: 36.9 % (ref 12.0–46.0)
Lymphs Abs: 2 10*3/uL (ref 0.7–4.0)
MCHC: 32.9 g/dL (ref 30.0–36.0)
MCV: 92 fl (ref 78.0–100.0)
Monocytes Absolute: 0.4 10*3/uL (ref 0.1–1.0)
Monocytes Relative: 8.1 % (ref 3.0–12.0)
Neutro Abs: 2.7 10*3/uL (ref 1.4–7.7)
Neutrophils Relative %: 49.6 % (ref 43.0–77.0)
Platelets: 322 10*3/uL (ref 150.0–400.0)
RBC: 4.66 Mil/uL (ref 3.87–5.11)
RDW: 13.4 % (ref 11.5–15.5)
WBC: 5.5 10*3/uL (ref 4.0–10.5)

## 2023-06-22 LAB — VITAMIN D 25 HYDROXY (VIT D DEFICIENCY, FRACTURES): VITD: 61.89 ng/mL (ref 30.00–100.00)

## 2023-06-22 LAB — COMPREHENSIVE METABOLIC PANEL
ALT: 12 U/L (ref 0–35)
AST: 20 U/L (ref 0–37)
Albumin: 4.6 g/dL (ref 3.5–5.2)
Alkaline Phosphatase: 60 U/L (ref 39–117)
BUN: 18 mg/dL (ref 6–23)
CO2: 32 mEq/L (ref 19–32)
Calcium: 9.6 mg/dL (ref 8.4–10.5)
Chloride: 101 mEq/L (ref 96–112)
Creatinine, Ser: 0.85 mg/dL (ref 0.40–1.20)
GFR: 65.47 mL/min (ref >60.00)
Glucose, Bld: 93 mg/dL (ref 70–99)
Potassium: 4.5 mEq/L (ref 3.5–5.1)
Sodium: 140 mEq/L (ref 135–145)
Total Bilirubin: 0.5 mg/dL (ref 0.2–1.2)
Total Protein: 7.4 g/dL (ref 6.0–8.3)

## 2023-06-22 LAB — LIPID PANEL
Cholesterol: 285 mg/dL — ABNORMAL HIGH (ref 0–200)
HDL: 73.9 mg/dL (ref >39.00)
LDL Cholesterol: 191 mg/dL — ABNORMAL HIGH (ref 0–99)
NonHDL: 211.45
Total CHOL/HDL Ratio: 4
Triglycerides: 101 mg/dL (ref 0.0–149.0)
VLDL: 20.2 mg/dL (ref 0.0–40.0)

## 2023-06-22 LAB — HEMOGLOBIN A1C: Hgb A1c MFr Bld: 6 % (ref 4.6–6.5)

## 2023-06-29 NOTE — Telephone Encounter (Signed)
Will check Availity

## 2023-06-30 NOTE — Telephone Encounter (Signed)
Patient is scheduled for next Prolia injection on 07/21/2023 at 10:00 AM.

## 2023-06-30 NOTE — Telephone Encounter (Signed)
Prior Auth APPROVED PA# [id number] Valid: 05/15/20-11/28/23

## 2023-06-30 NOTE — Telephone Encounter (Signed)
Pt ready for scheduling on or after 07/20/23  Out-of-pocket cost due at time of visit: $0  Primary: [employer] Medicare [facility] Choice PPO Prolia co-insurance: 0% Admin fee co-insurance: 0%  Deductible does not apply  Prior Auth APPROVED PA# [id number] Valid: 05/15/20-11/28/23  Secondary: N/A Prolia co-insurance:  Admin fee co-insurance:  Deductible:  Prior Auth:  PA# Valid:     ** This summary of benefits is an estimation of the patient's out-of-pocket cost. Exact cost may very based on individual plan coverage.

## 2023-06-30 NOTE — Telephone Encounter (Addendum)
[employer] / Availity Patient Recruitment consultant

## 2023-07-03 ENCOUNTER — Encounter: Payer: Self-pay | Admitting: Family Medicine

## 2023-07-03 ENCOUNTER — Ambulatory Visit: Payer: Medicare PPO | Admitting: Family Medicine

## 2023-07-03 VITALS — BP 110/62 | HR 77 | Temp 97.6°F | Ht 66.0 in | Wt 140.8 lb

## 2023-07-03 DIAGNOSIS — M818 Other osteoporosis without current pathological fracture: Secondary | ICD-10-CM

## 2023-07-03 DIAGNOSIS — E785 Hyperlipidemia, unspecified: Secondary | ICD-10-CM | POA: Diagnosis not present

## 2023-07-03 DIAGNOSIS — M25551 Pain in right hip: Secondary | ICD-10-CM | POA: Diagnosis not present

## 2023-07-03 DIAGNOSIS — E559 Vitamin D deficiency, unspecified: Secondary | ICD-10-CM

## 2023-07-03 DIAGNOSIS — K219 Gastro-esophageal reflux disease without esophagitis: Secondary | ICD-10-CM

## 2023-07-03 DIAGNOSIS — M25552 Pain in left hip: Secondary | ICD-10-CM

## 2023-07-03 DIAGNOSIS — R7303 Prediabetes: Secondary | ICD-10-CM

## 2023-07-03 DIAGNOSIS — R7309 Other abnormal glucose: Secondary | ICD-10-CM | POA: Diagnosis not present

## 2023-07-03 MED ORDER — ROSUVASTATIN CALCIUM 10 MG PO TABS: 10.0000 mg | ORAL_TABLET | ORAL | 3 refills | Status: DC

## 2023-07-03 NOTE — Patient Instructions (Addendum)
Let us know if you get your TDap, COVID or flu vaccine this fall.  We have placed a referral for you today to Dr. Charlann Boxer with [facility].  If you do not see # listed- you should receive a mychart message or phone call within a week with the # to call. Reach out to [place] if you are not scheduled within 2 weeks but honestly I would simply call their office directly in a week regardless  Trial rosuvastatin once a week as well  Recommended follow up: Return in about 6 months (around 01/03/2024) for physical or sooner if needed.Schedule b4 you leave.

## 2023-07-03 NOTE — Progress Notes (Signed)
Phone [phone] In person visit   Subjective:   Dana Chambers is a 77 y.o. year old very pleasant female patient who presents for/with See problem oriented charting Chief Complaint  Patient presents with   Medical Management of Chronic Issues   Hyperlipidemia    Wants to discuss A1c/cholesterol   Past Medical History-  Patient Active Problem List   Diagnosis Date Noted   Raynaud's disease 01/03/2023    Priority: Medium    Hyperlipidemia- february 2020 coronary Calcium score of 0 12/12/2018    Priority: Medium    Vitamin D insufficiency 08/13/2018    Priority: Medium    Elevated glycosylated hemoglobin 08/13/2018    Priority: Medium    Rib pain on left side 03/21/2017    Priority: Medium    Scoliosis 03/21/2017    Priority: Medium    Osteoporosis 06/29/2009    Priority: Medium    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     Cholecalciferol (VITAMIN D PO) Take by mouth.     Cyanocobalamin (VITAMIN B 12 PO) Take by mouth.     denosumab (PROLIA) 60 MG/ML SOSY injection Inject 60 mg into the skin every 6 (six) months.     esomeprazole (NEXIUM) 20 MG capsule TAKE ONE CAPSULE BY MOUTH EVERY DAY AT NOON 90 capsule 3   rosuvastatin (CRESTOR) 10 MG tablet Take 1 tablet (10 mg total) by mouth once a week. 13 tablet 3   No current facility-administered medications for this visit.     Objective:  BP 110/62   Pulse 77   Temp 97.6 F (36.4 C)   Ht 5\' 6"  (1.676 m)   Wt 140 lb 12.8 oz (63.9 kg)   LMP  (LMP Unknown)   SpO2 99%   BMI 22.73  kg/m  Gen: NAD, resting comfortably CV: RRR no murmurs rubs or gallops Lungs: CTAB no crackles, wheeze, rhonchi Ext: no edema Skin: warm, dry, 2 mm lesion right cheek- likely seborrheic keratosis  Corn on bottom of of left 2nd toe where she has hammer toe    Assessment and Plan   #Health maintenance- considering fall flu and COVID shot- likely will get and let us know  #hyperlipidemia-patient with history of coronary calcium score of 0 on 01/16/2019. S: Medication: None at present  with family history patient was opted to take rosuvastatin 10 mg once a week in the past with incredible improvement of LDL from over 200-113. She came off statin due to increased a1c Lab Results  Component Value Date   CHOL 285 (H) 06/22/2023   HDL 73.90 06/22/2023   LDLCALC 191 (H) 06/22/2023   LDLDIRECT 168.5 02/02/2012   TRIG 101.0 06/22/2023   CHOLHDL 4 06/22/2023   A/P: cholesterol mildly improved but we would still love to see more substantial decrease- we are going to try the rosuvastatin again and at 6 month follow up can check LDL or full lipid panel as well as a1c to make sure doesn't increase prediabetes risk much- last  time a1c within 9 months went up 0.5 so we will monitor closely  #MUSCULOSKELETAL issues- bilateral hip pain and ankle pain noted- Dr. Dierdre Forth. Injections in ankle with Dr. Charlsie Merles in past but had facial breakout.  - has bilateral hip pain and on the right side radiates down and across the upper leg- we will refer to Dr. Charlann Boxer who she has seen in the past for his opinion. No groin pain- doubt going to need knee replacement. She also asks about neurology vs rheumatology- recommended seeing what orthopedic says first -has also see Dr. Berline Chough in past for upper back/neck pain- still some lingering issues. Physical therapy not helpful.   #Corn on toe- has worked with Dr. Charlsie Merles in past- getting good pedicure lately and stable. Wears gel caps at times with shoes on   #Spot on face- can't  see Dr. Leonie Man soon so wants me to look. Looks like seborrheic keratosis but if changes would be worht seeing dermatology  # GERD S:Medication: Nexium 20 mg-has required 40 mg in the past -With osteoporosis history prefer lowest dose possible  B12 levels related to PPI use: Low normal in 2019 and empirically takes B12 now A/P: GERD stable- continue current medicines. Still takes empiric B12   # Hyperglycemia/insulin resistance/prediabetes-peak A1c of 6.2 in the past-increased while on statin S:  Medication: none Lab Results  Component Value Date   HGBA1C 6.0 06/22/2023   HGBA1C 6.0 01/03/2023   HGBA1C 6.1 09/26/2022  A/P: thankfully a1c stable- we are hoping this does not increase with starting statin.   # Osteoporosis-follows with Dr. Elvera Lennox S: Last DEXA: 10/24/22 with slight worsening in left femur neck  Medication: Prolia through Dr. Elvera Lennox  Calcium: 1200mg  (through diet ok) recommended  Vitamin D: 1000 units a day recommended-vitamin D has been taking A/P: hopefully stable- continue close follow up with endocrinology   #Neuropathy- simply tolereates - wonders if element of this related to the hip issues   #Vitamin D deficiency S: Medication: vitamin D 2000 units Last vitamin D Lab Results  Component Value Date   VD25OH 61.89 06/22/2023  A/P: excellent control- continue current medications    Recommended follow up: Return in about 6 months (around 01/03/2024) for physical or sooner if needed.Schedule b4 you leave. Future Appointments  Date Time Provider Department Center  07/21/2023 10:00 AM [facility] NURSE [facility] None  09/26/2023  9:30 AM Patton Salles, MD [facility] None  09/27/2023 10:00 AM Carlus Pavlov, MD [facility] None  01/05/2024  9:00 AM Shelva Majestic, MD [facility]  03/11/2024  8:15 AM [facility] ANNUAL WELLNESS VISIT 1 [facility]    Lab/Order associations:   ICD-10-CM   1. Bilateral hip pain  M25.551 Ambulatory referral to  Orthopedic Surgery   M25.552     2. Hyperlipidemia, unspecified hyperlipidemia type  E78.5     3. Gastroesophageal reflux disease, unspecified whether esophagitis present  K21.9     4. Other osteoporosis without current pathological fracture  M81.8     5. Elevated glycosylated hemoglobin  R73.09     6. Prediabetes  R73.03     7. Vitamin D insufficiency  E55.9       Meds ordered this encounter  Medications   rosuvastatin (CRESTOR) 10 MG tablet    Sig: Take 1 tablet (10 mg total) by mouth once a week.    Dispense:  13 tablet    Refill:  3    Return precautions advised.  Tana Conch, MD

## 2023-07-21 ENCOUNTER — Ambulatory Visit: Payer: Medicare PPO

## 2023-07-21 VITALS — BP 130/85 | HR 79 | Ht 66.0 in | Wt 144.4 lb

## 2023-07-21 DIAGNOSIS — M818 Other osteoporosis without current pathological fracture: Secondary | ICD-10-CM

## 2023-07-21 MED ORDER — DENOSUMAB 60 MG/ML ~~LOC~~ SOSY
60.0000 mg | PREFILLED_SYRINGE | Freq: Once | SUBCUTANEOUS | Status: AC
Start: 2023-07-21 — End: 2023-07-21
  Administered 2023-07-21: 60 mg via SUBCUTANEOUS

## 2023-07-21 NOTE — Progress Notes (Signed)
After obtaining consent, and per orders of Dr. Gherghe, injection of Prolia   given by Leigh A Roberts. Patient instructed to remain in clinic for 20 minutes afterwards, and to report any adverse reaction to me immediately.  

## 2023-08-19 NOTE — Telephone Encounter (Signed)
Last Prolia inj 07/21/23 Next Prolia inj due 01/22/24

## 2023-08-21 DIAGNOSIS — M25552 Pain in left hip: Secondary | ICD-10-CM | POA: Diagnosis not present

## 2023-08-21 DIAGNOSIS — M7061 Trochanteric bursitis, right hip: Secondary | ICD-10-CM | POA: Diagnosis not present

## 2023-08-21 DIAGNOSIS — M7062 Trochanteric bursitis, left hip: Secondary | ICD-10-CM | POA: Diagnosis not present

## 2023-08-21 DIAGNOSIS — M25551 Pain in right hip: Secondary | ICD-10-CM | POA: Diagnosis not present

## 2023-08-23 ENCOUNTER — Other Ambulatory Visit: Payer: Self-pay | Admitting: Family Medicine

## 2023-08-23 DIAGNOSIS — Z0000 Encounter for general adult medical examination without abnormal findings: Secondary | ICD-10-CM

## 2023-09-12 NOTE — Progress Notes (Signed)
77 y.o. G43P1001 Married Caucasian female here for breast and pelvic exam.   Some pelvic heaviness at times.  She has some difficulty controlling her bladder and notices some coloration in her underwear over the past year at least.  The color is brownish.   Sometimes has vaginal itching/burning.   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  Her last BMD done 10/24/22 at this office.  She takes Prolia through Dr. Elvera Lennox.  Last injection 07/15/22.   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   PCP: Shelva Majestic, MD   No LMP recorded (lmp unknown). Patient is postmenopausal.           Sexually active: No.  The current method of family planning is post menopausal status.    Exercising: Yes.     Water walking Smoker:  no  OB History  Gravida Para Term Preterm AB Living  1 1 1    0 1  SAB IAB Ectopic Multiple Live Births  0   0        # Outcome Date GA Lbr Len/2nd Weight Sex Type Anes PTL Lv  1 Term              Health Maintenance: Pap:  09/22/21 neg, 09/13/18 neg History of abnormal Pap:  no MMG: 10/06/22 Breast Density Cat C, BI-RADS CAT 2 benign.  Has appt 10/09/23.  Colonoscopy:   HM Colonoscopy          Discontinued - Colonoscopy  Discontinued      Frequency changed to Never automatically (Topic No Longer Applies)   02/17/2022  COLONOSCOPY   Only the first 1 history entries have been loaded, but more history exists.           BMD:  10/24/22  Result  osteoporosis  HIV: n/a Hep C: 12/06/17 NR  Immunization History  Administered Date(s) Administered   Fluad Quad(high Dose 65+) 07/29/2020, 08/12/2022   Influenza Whole 10/29/2009, 08/11/2010, 08/19/2011   Influenza, High Dose Seasonal PF 09/29/2016, 08/21/2017, 08/01/2018, 07/15/2019, 07/29/2020, 07/21/2021, 08/19/2022   Influenza,inj,Quad PF,6+ Mos 08/21/2013, 09/02/2014   Influenza-Unspecified 09/09/2015, 08/01/2018, 07/17/2019   PFIZER Comirnaty(Gray  Top)Covid-19 Tri-Sucrose Vaccine 04/08/2021, 08/19/2022   PFIZER(Purple Top)SARS-COV-2 Vaccination 12/23/2019, 01/13/2020, 07/31/2020   Pfizer Covid-19 Vaccine Bivalent Booster 78yrs & up 08/11/2021, 04/06/2022   Pneumococcal Conjugate-13 09/02/2014   Pneumococcal Polysaccharide-23 03/06/2012   Respiratory Syncytial Virus Vaccine,Recomb Aduvanted(Arexvy) 08/19/2022   Tdap 03/06/2012   Zoster Recombinant(Shingrix) 03/10/2018, 05/10/2018      reports that she has never smoked. She has never used smokeless tobacco. She reports current alcohol use of about 7.0 standard drinks of alcohol per week. She reports that she does not use drugs.  Past Medical History:  Diagnosis Date   Basal cell carcinoma of skin    left ankle, right thigh   Bursitis    right hip   Cancer (HCC) 2006   Breast/right   COVID 01/27/2021   Diverticulosis    GERD (gastroesophageal reflux disease)    History of breast cancer/33 radiation treatments/ no chemo    2006  S/P RIGHT LUMPECTOMY AND RADIATION FOLLOWED BY DR RUBIN--  NO RECURRENCE   History of kidney stones    Hyperplastic colon polyp    IBS (irritable bowel syndrome)    Idiopathic peripheral neuropathy    bilateral tingling/ due to fall in [facility]   Internal hemorrhoid    Kidney stone    Leiomyoma  Multiple small   Liver cyst 2006   Benign   OA (osteoarthritis)    Osteoporosis 2019   T score -2.5   Personal history of radiation therapy    Raynaud disease    Renal calculus, left    non obstructive   Rib pain on left side    able to lay on left side   Rosacea    Tinnitus     Past Surgical History:  Procedure Laterality Date   BREAST BIOPSY Left 2016   benign   BREAST EXCISIONAL BIOPSY Right 1973   benign   BREAST LUMPECTOMY  2006   Right -- S/P RADIATION--  NO RECURRENCE   CATARACT EXTRACTION     DILATION AND CURETTAGE OF UTERUS     EXTRACORPOREAL SHOCK WAVE LITHOTRIPSY     X2   HYSTEROSCOPY     HYSTEROSCOPY WITH D & C  08/03/2012    Procedure: DILATATION AND CURETTAGE /HYSTEROSCOPY;  Surgeon: Dara Lords, MD;  Location: [facility];  Service: Gynecology;  Laterality: N/A;   SUPRANUMERARY NIPPLE EXCISION  1973   CYST   TONSILLECTOMY  AGE 54    Current Outpatient Medications  Medication Sig Dispense Refill   acetaminophen (TYLENOL) 500 MG tablet Take 1,000 mg by mouth 3 (three) times daily as needed.     Cholecalciferol (VITAMIN D PO) Take by mouth.     Cyanocobalamin (VITAMIN B 12 PO) Take by mouth.     denosumab (PROLIA) 60 MG/ML SOSY injection Inject 60 mg into the skin every 6 (six) months.     doxycycline (ADOXA) 100 MG tablet Take 100 mg by mouth 2 (two) times daily.     esomeprazole (NEXIUM) 20 MG capsule TAKE ONE CAPSULE BY MOUTH EVERY DAY AT NOON 90 capsule 3   rosuvastatin (CRESTOR) 10 MG tablet Take 1 tablet (10 mg total) by mouth once a week. 13 tablet 3   No current facility-administered medications for this visit.    Family History  Problem Relation Age of Onset   Dementia Mother    Depression Mother    Osteoporosis Mother    Stroke Father    Heart attack Father 47   Rectal cancer Maternal Grandmother    Colon cancer Maternal Grandmother    Stroke Sister        TIA. ? dementia. another stroke after covid then feeding issues led to eath   Dementia Sister    Breast cancer Neg Hx     Review of Systems  See HPI.  Exam:   BP 128/82 (BP Location: Right Arm, Patient Position: Sitting, Cuff Size: Normal)   Ht 5\' 6"  (1.676 m)   Wt 142 lb (64.4 kg)   LMP  (LMP Unknown)   BMI 22.92 kg/m     General appearance: alert, cooperative and appears stated age Head: normocephalic, without obvious abnormality, atraumatic Neck: no adenopathy, supple, symmetrical, trachea midline and thyroid normal to inspection and palpation Lungs: clear to auscultation bilaterally Breasts: left - normal appearance, no masses or tenderness, No nipple retraction or dimpling, No nipple discharge or  bleeding, No axillary adenopathy Right - nipple absent,  no masses or tenderness, No nipple retraction or dimpling, No nipple discharge or bleeding, No axillary adenopathy Heart: regular rate and rhythm Abdomen: soft, non-tender; no masses, no organomegaly Extremities: extremities normal, atraumatic, no cyanosis or edema Skin: skin color, texture, turgor normal. No rashes or lesions Lymph nodes: cervical, supraclavicular, and axillary nodes normal. Neurologic: grossly normal  Pelvic:  External genitalia:  no lesions              No abnormal inguinal nodes palpated.              Urethra:  normal appearing urethra with no masses, tenderness or lesions              Bartholins and Skenes: normal                 Vagina: normal appearing vagina with normal color and discharge, no lesions.  Atrophy noted.               Cervix: no lesions              Pap taken: yes Bimanual Exam:  Uterus:  normal size, contour, position, consistency, mobility, non-tender              Adnexa: no mass, fullness, tenderness              Rectal exam: yes.  Confirms.              Anus:  normal sphincter tone, no lesions  Chaperone was present for exam:   Warren Lacy, CMA  Assessment:  Encounter for breast and pelvic exam.  Dark colored urine versus postmenopausal bleeding.  Pelvic pressure. Cervical cancer screening.  Hx right breast cancer.  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  Status post right lumpectomy and XRT. Osteoporosis.  Tx with Prolia through endocrinology.  Hx UTIs.  Hx calcium oxylate renal stones.  Status post lithotripsy.   Plan: We discussed etiologies of postmenopausal bleeding: atrophy, infection, polyps, precancer, cancer.  Pap and HR HPV collected.  Return for pelvic US and possible EMB.  Urinalysis:  normal.  No UC indicated.   Mammogram screening discussed. Self breast awareness reviewed. Guidelines for Calcium, Vitamin D, regular exercise program including cardiovascular and  weight bearing exercise. BMD in Nov. 2024.   35 min  total time was spent for this patient encounter, including preparation, face-to-face counseling with the patient, coordination of care, and documentation of the encounter in addition to doing the breast and pelvic exam and pap.

## 2023-09-18 DIAGNOSIS — H00021 Hordeolum internum right upper eyelid: Secondary | ICD-10-CM | POA: Diagnosis not present

## 2023-09-18 DIAGNOSIS — H26493 Other secondary cataract, bilateral: Secondary | ICD-10-CM | POA: Diagnosis not present

## 2023-09-18 DIAGNOSIS — H04123 Dry eye syndrome of bilateral lacrimal glands: Secondary | ICD-10-CM | POA: Diagnosis not present

## 2023-09-26 ENCOUNTER — Encounter: Payer: Self-pay | Admitting: Obstetrics and Gynecology

## 2023-09-26 ENCOUNTER — Ambulatory Visit: Payer: Medicare PPO | Admitting: Obstetrics & Gynecology

## 2023-09-26 ENCOUNTER — Ambulatory Visit (INDEPENDENT_AMBULATORY_CARE_PROVIDER_SITE_OTHER): Payer: Medicare PPO | Admitting: Obstetrics and Gynecology

## 2023-09-26 ENCOUNTER — Other Ambulatory Visit (HOSPITAL_COMMUNITY)
Admission: RE | Admit: 2023-09-26 | Discharge: 2023-09-26 | Disposition: A | Discharge status: Home / Self Care | Payer: Medicare PPO | Source: Ambulatory Visit | Attending: Obstetrics and Gynecology | Admitting: Obstetrics and Gynecology

## 2023-09-26 VITALS — BP 128/82 | Ht 66.0 in | Wt 142.0 lb

## 2023-09-26 DIAGNOSIS — Z124 Encounter for screening for malignant neoplasm of cervix: Secondary | ICD-10-CM

## 2023-09-26 DIAGNOSIS — Z01419 Encounter for gynecological examination (general) (routine) without abnormal findings: Secondary | ICD-10-CM | POA: Insufficient documentation

## 2023-09-26 DIAGNOSIS — N950 Postmenopausal bleeding: Secondary | ICD-10-CM

## 2023-09-26 DIAGNOSIS — R82998 Other abnormal findings in urine: Secondary | ICD-10-CM | POA: Diagnosis not present

## 2023-09-26 DIAGNOSIS — Z1151 Encounter for screening for human papillomavirus (HPV): Secondary | ICD-10-CM | POA: Diagnosis not present

## 2023-09-26 LAB — URINALYSIS, COMPLETE W/RFL CULTURE
Bacteria, UA: NONE SEEN /[HPF]
Bilirubin Urine: NEGATIVE
Glucose, UA: NEGATIVE
Hgb urine dipstick: NEGATIVE
Hyaline Cast: NONE SEEN /[LPF]
Ketones, ur: NEGATIVE
Leukocyte Esterase: NEGATIVE
Nitrites, Initial: NEGATIVE
Protein, ur: NEGATIVE
RBC / HPF: NONE SEEN /[HPF] (ref 0–2)
Specific Gravity, Urine: 1.01 (ref 1.001–1.035)
WBC, UA: NONE SEEN /[HPF] (ref 0–5)
pH: 5.5 (ref 5.0–8.0)

## 2023-09-26 LAB — NO CULTURE INDICATED

## 2023-09-26 NOTE — Patient Instructions (Signed)
Endometrial Biopsy  An endometrial biopsy is a procedure where a tissue sample is removed from the lining of the uterus. This lining is called the endometrium. The tissue sample is then sent to a lab for testing. You may have this type of biopsy to check for: Cancer. Infection. Growths called polyps. Uterine bleeding that can't be explained. Tell a health care provider about: Any allergies you have. All medicines you're taking including vitamins, herbs, eye drops, creams, and over-the-counter medicines. Any problems you or family members have had with anesthesia. Any bleeding problems you have. Any surgeries you have had. Any medical problems you have. Whether you're pregnant or may be pregnant. What are the risks? Your health care provider will talk with you about risks. These may include: Bleeding. Infection. Allergic reactions to medicines. Damage to the wall of the uterus. This is rare. What happens before the procedure? Keep track of your period. You may need to have this biopsy when you're not having your period. Ask your provider about: Changing or stopping your regular medicines. These include any diabetes medicines or blood thinners you take. Taking medicines such as aspirin and ibuprofen. These medicines can thin your blood. Do not take them unless your provider tells you to. Taking over-the-counter medicines, vitamins, herbs, and supplements. Bring a pad with you. You may need to wear one after the biopsy. Plan to have a responsible adult take you home from the hospital or clinic. You won't be allowed to drive. What happens during the procedure? A tool will be put into your vagina to hold it open. This helps your provider see the cervix. The cervix is the lowest part of the uterus. Your cervix will be cleaned with a solution that kills germs. You will be given anesthesia. This keeps you from feeling pain. It will numb your cervix. A tool called forceps will be used to  hold your cervix steady. A thin tool called a uterine sound will be put through your cervix. It will be used to: Find the length of your uterus. Find where to take the sample from. A soft tube called a catheter will be put into your uterus. The catheter will remove a tissue sample. The tube and tools will be removed. The sample will be sent to a lab for testing. The procedure may vary among providers and hospitals. What happens after the procedure? Your blood pressure, heart rate, breathing rate, and blood oxygen level will be monitored until you leave the hospital or clinic. It's up to you to get the results of your procedure. Ask your provider, or the department that is doing the procedure, when your results will be ready. This information is not intended to replace advice given to you by your health care provider. Make sure you discuss any questions you have with your health care provider. Document Revised: 01/24/2023 Document Reviewed: 01/24/2023 Elsevier Patient Education  2024 [employer]   EXERCISE AND DIET:  We recommended that you start or continue a regular exercise program for good health. Regular exercise means any activity that makes your heart beat faster and makes you sweat.  We recommend exercising at least 30 minutes per day at least 3 days a week, preferably 4 or 5.  We also recommend a diet low in fat and sugar.  Inactivity, poor dietary choices and obesity can cause diabetes, heart attack, stroke, and kidney damage, among others.    ALCOHOL AND SMOKING:  Women should limit their alcohol intake to no more than 7  drinks/beers/glasses of wine (combined, not each!) per week. Moderation of alcohol intake to this level decreases your risk of breast cancer and liver damage. And of course, no recreational drugs are part of a healthy lifestyle.  And absolutely no smoking or even second hand smoke. Most people know smoking can cause heart and lung diseases, but did you know it also  contributes to weakening of your bones? Aging of your skin?  Yellowing of your teeth and nails?  CALCIUM AND VITAMIN D:  Adequate intake of calcium and Vitamin D are recommended.  The recommendations for exact amounts of these supplements seem to change often, but generally speaking 600 mg of calcium (either carbonate or citrate) and 800 units of Vitamin D per day seems prudent. Certain women may benefit from higher intake of Vitamin D.  If you are among these women, your doctor will have told you during your visit.    PAP SMEARS:  Pap smears, to check for cervical cancer or precancers,  have traditionally been done yearly, although recent scientific advances have shown that most women can have pap smears less often.  However, every woman still should have a physical exam from her gynecologist every year. It will include a breast check, inspection of the vulva and vagina to check for abnormal growths or skin changes, a visual exam of the cervix, and then an exam to evaluate the size and shape of the uterus and ovaries.  And after 79 years of age, a rectal exam is indicated to check for rectal cancers. We will also provide age appropriate advice regarding health maintenance, like when you should have certain vaccines, screening for sexually transmitted diseases, bone density testing, colonoscopy, mammograms, etc.   MAMMOGRAMS:  All women over 66 years old should have a yearly mammogram. Many facilities now offer a "3D" mammogram, which may cost around $50 extra out of pocket. If possible,  we recommend you accept the option to have the 3D mammogram performed.  It both reduces the number of women who will be called back for extra views which then turn out to be normal, and it is better than the routine mammogram at detecting truly abnormal areas.    COLONOSCOPY:  Colonoscopy to screen for colon cancer is recommended for all women at age 38.  We know, you hate the idea of the prep.  We agree, BUT, having colon  cancer and not knowing it is worse!!  Colon cancer so often starts as a polyp that can be seen and removed at colonscopy, which can quite literally save your life!  And if your first colonoscopy is normal and you have no family history of colon cancer, most women don't have to have it again for 10 years.  Once every ten years, you can do something that may end up saving your life, right?  We will be happy to help you get it scheduled when you are ready.  Be sure to check your insurance coverage so you understand how much it will cost.  It may be covered as a preventative service at no cost, but you should check your particular policy.

## 2023-09-27 ENCOUNTER — Encounter: Payer: Self-pay | Admitting: Internal Medicine

## 2023-09-27 ENCOUNTER — Ambulatory Visit: Payer: Medicare PPO | Admitting: Internal Medicine

## 2023-09-27 VITALS — BP 120/70 | HR 78 | Ht 66.0 in | Wt 143.8 lb

## 2023-09-27 DIAGNOSIS — R7303 Prediabetes: Secondary | ICD-10-CM | POA: Diagnosis not present

## 2023-09-27 DIAGNOSIS — E559 Vitamin D deficiency, unspecified: Secondary | ICD-10-CM

## 2023-09-27 DIAGNOSIS — M818 Other osteoporosis without current pathological fracture: Secondary | ICD-10-CM

## 2023-09-27 LAB — CYTOLOGY - PAP
Comment: NEGATIVE
Diagnosis: NEGATIVE
High risk HPV: NEGATIVE

## 2023-09-27 NOTE — Patient Instructions (Addendum)
Please continue vitamin D 2000 units daily.  Please come back for a follow-up appointment in 1 year.

## 2023-09-27 NOTE — Progress Notes (Signed)
Patient ID: Dana Chambers, female   DOB: [birth year], 77 y.o.   MRN: [id number]  HPI  Dana Chambers is a 77 y.o.-year-old female, initially referred by Dr. Audie Box, for management of osteoporosis.  Last visit 1 year ago.  Interim history: She denies dizziness, vertigo.   No falls or fractures since last visit. She had hip pain: bursitis >> saw [facility] (Dr. Charlann Boxer), had Xrays - had a steroid inj. She may need another injection. She had some vaginal discharge >> saw Dr. Edward Jolly >> will have a TV U/S  Reviewed history: Pt was dx with OP in 2006.  Reviewed patient's DXA scan reports: Date L1-L3 T score FN T score 33% distal Radius Ultra distal radius   10/24/2022 ([facility]) -0.5 RFN: -2.5 LFN: -2.1 -1.9 (-4%) N/a  No comparisons available for the spine and femoral neck T-scores (?).  Date L1-L3 T score FN T score 33% distal Radius Ultra distal radius   10/19/2020 ([facility]) -0.3 (-1.6%) RFN: -2.4 (+1.5%) LFN: -2.5 (-6.8%*) -1.5 (+9.8%*) N/a  10/17/2018 ([facility]) -0.4 (+12.6%*) RFN: -2.5 (+2.0%) LFN: -2.1 (+3.9%) -2.4 (-3.6%) n/a  10/12/2016 ([facility])  -1.4 (-2%)  RFN: -2.6 LFN: -2.4  -2.1 (-2.9%)  n/a  10/12/2015 ([facility])  -1.3 (+7.1%*)  RFN: -2.6 LFN: -2.7 -1.8  n/a  10/09/2013 ([facility])  -1.8  RFN: -2.5 LFN: -2.2 -3.0 (-14.9%*)  -4.5   09/07/2011 ([facility])  n/a RFN: -2.2   -3.1 (+3.0%)   08/26/2009 ([facility])  -1.1  RFN: -1.7 LFN: -1.9     She denies any fractures. In the past, she had a fall (slipped) x1 >> on coccyx + hands >> no fxs, but developed traumatic carpal tunnel.    Reviewed previous osteoporosis treatments: - Bisphosphonates (Fosamax, Boniva) for approximately 10 years, followed by drug holiday - Prolia x 11inj now -started in 2016: ... 11/26/2019  05/27/2020 12/30/2020 07/07/2021 01/12/2022 07/15/2022 01/18/2023 07/21/2023  We started supplementation in 2018.  Subsequent vitamin D levels were normal: Lab Results   Component Value Date   VD25OH 61.89 06/22/2023   VD25OH 47.18 01/03/2023   VD25OH 48.10 09/26/2022   VD25OH 53.7 08/27/2021   VD25OH 57.7 08/27/2021   VD25OH 47.5 08/21/2020   VD25OH 67.38 08/19/2019   VD25OH 41.53 12/12/2018   VD25OH 40.81 08/13/2018   VD25OH 37.58 12/06/2017   She is on vitamin D 2000 units daily.  No weightbearing exercises. She goes to [facility] - water walking 45-60 min twice a week.  In the past, she was also using weights but not anymore due to upper back pain.  She has a history of hammertoes and even a small ulcer on the bottoms of her feet >> instability in her feet as she is trying to avoid the pain when walking.  Also, neuropathy sxs in legs, chronic.  She sees Dr. Charlsie Merles.  Menopause was at 77 years old.  Pt does have a FH of osteoporosis: In mother, who also had dowagers hump. Mother's aunt also had this.  No history of hyper or hypocalcemia or hyperparathyroidism.  She has a history of kidney stones and had lithotripsy x2.  No history of hyper or hypocalcemia or hyperparathyroidism. Lab Results  Component Value Date   PTH 48 08/10/2017   PTH Comment 08/10/2017   CALCIUM 9.6 06/22/2023   CALCIUM 9.9 01/09/2023   CALCIUM 9.4 01/06/2023   CALCIUM 9.6 01/03/2023   CALCIUM 9.7 10/31/2022   CALCIUM 10.1 08/26/2022   CALCIUM 9.2 12/27/2021   CALCIUM 9.8 08/27/2021   CALCIUM  10.2 08/27/2021   CALCIUM 9.6 12/21/2020   No thyrotoxicosis: Lab Results  Component Value Date   TSH 1.28 01/03/2023   TSH 1.32 12/27/2021   TSH 1.35 04/16/2021   TSH 1.05 07/16/2018   TSH 1.05 11/29/2016   She had a neck MRI >> thyroid nodules >> U/S (04/21/2021): small nodules, not worrisome: 1. Findings suggestive of multinodular goiter. 2. None of the discretely measured nodules/cysts, including dominant right-sided partially solid though predominantly cystic nodule correlating with nodule seen on preceding cervical spine MRI, meet imaging criteria to recommend  percutaneous sampling or continued dedicated follow-up.  + Mild CKD. Last BUN/Cr: Lab Results  Component Value Date   BUN 18 06/22/2023   CREATININE 0.85 06/22/2023   She had BrCA 2006, s/p RxTx, no ChTx >> she was on tamoxifen for 4.5 years.  She continues on PPIs for GERD.  She also has a history of neuropathy - mostly in L leg.  She is on B12 supplements.  She has a history of elevated HbA1c: Lab Results  Component Value Date   HGBA1C 6.0 06/22/2023   HGBA1C 6.0 01/03/2023   HGBA1C 6.1 09/26/2022   HGBA1C 6.0 03/28/2022   HGBA1C 6.2 12/27/2021   HGBA1C 5.9 (H) 08/27/2021   HGBA1C CANCELED 08/27/2021   HGBA1C 6.0 12/21/2020   HGBA1C 6.2 08/21/2020   HGBA1C 5.7 05/06/2020   She was started on Crestor 10 mg in 06/2023 by PCP - once a week.   She had L shoulder pain in 11/2020 >> sees Dr. Berline Chough (suspected muscle problem) >> PT and dry needling - did not help.  ROS: + see HPI + acid reflux  I reviewed pt's medications, allergies, PMH, social hx, family hx, and changes were documented in the history of present illness. Otherwise, unchanged from my initial visit note.  Past Medical History:  Diagnosis Date   Basal cell carcinoma of skin    left ankle, right thigh   Bursitis    right hip   Cancer (HCC) 2006   Breast/right   COVID 01/27/2021   Diverticulosis    GERD (gastroesophageal reflux disease)    History of breast cancer/33 radiation treatments/ no chemo    2006  S/P RIGHT LUMPECTOMY AND RADIATION FOLLOWED BY DR RUBIN--  NO RECURRENCE   History of kidney stones    Hyperplastic colon polyp    IBS (irritable bowel syndrome)    Idiopathic peripheral neuropathy    bilateral tingling/ due to fall in [facility]   Internal hemorrhoid    Kidney stone    Leiomyoma    Multiple small   Liver cyst 2006   Benign   OA (osteoarthritis)    Osteoporosis 2019   T score -2.5   Personal history of radiation therapy    Raynaud disease    Renal calculus, left    non  obstructive   Rib pain on left side    able to lay on left side   Rosacea    Tinnitus    Past Surgical History:  Procedure Laterality Date   BREAST BIOPSY Left 2016   benign   BREAST EXCISIONAL BIOPSY Right 1973   benign   BREAST LUMPECTOMY  2006   Right -- S/P RADIATION--  NO RECURRENCE   CATARACT EXTRACTION     DILATION AND CURETTAGE OF UTERUS     EXTRACORPOREAL SHOCK WAVE LITHOTRIPSY     X2   HYSTEROSCOPY     HYSTEROSCOPY WITH D & C  08/03/2012   Procedure: DILATATION  AND CURETTAGE /HYSTEROSCOPY;  Surgeon: Dara Lords, MD;  Location: [facility];  Service: Gynecology;  Laterality: N/A;   SUPRANUMERARY NIPPLE EXCISION  1973   CYST   TONSILLECTOMY  AGE 45   Social History   Social History   Marital status: Married    Spouse name: N/A   Number of children: 1   Occupational History   Retired- Retail buyer Also, used to sing >> now hoarseness   Social History Main Topics   Smoking status: Never Smoker   Smokeless tobacco: Never Used   Alcohol use 3.6 oz/week    6 Standard drinks or equivalent per week   Drug use: No   Social History Narrative   Married (husband patient of Dr. Durene Cal). 1 daughter. 1 granddaughter.       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       Hobbies: reading, travel, time with granddaughter. Very active   Current Outpatient Medications on File Prior to Visit  Medication Sig Dispense Refill   acetaminophen (TYLENOL) 500 MG tablet Take 1,000 mg by mouth 3 (three) times daily as needed.     Cholecalciferol (VITAMIN D PO) Take by mouth.     Cyanocobalamin (VITAMIN B 12 PO) Take by mouth.     denosumab (PROLIA) 60 MG/ML SOSY injection Inject 60 mg into the skin every 6 (six) months.     doxycycline (ADOXA) 100 MG tablet Take 100 mg by mouth 2 (two) times daily.     esomeprazole (NEXIUM) 20 MG capsule TAKE ONE CAPSULE BY MOUTH EVERY DAY AT NOON 90 capsule 3   rosuvastatin (CRESTOR) 10 MG tablet Take 1 tablet (10 mg total) by  mouth once a week. 13 tablet 3   No current facility-administered medications on file prior to visit.   Allergies  Allergen Reactions   Triamcinolone Acetonide [Triamcinolone] Other (See Comments)    Red cheeks and nose, elevated heart rate   Amoxicillin Diarrhea    GI upset   Family History  Problem Relation Age of Onset   Dementia Mother    Depression Mother    Osteoporosis Mother    Stroke Father    Heart attack Father 46   Rectal cancer Maternal Grandmother    Colon cancer Maternal Grandmother    Stroke Sister        TIA. ? dementia. another stroke after covid then feeding issues led to eath   Dementia Sister    Breast cancer Neg Hx     PE: BP 120/70   Pulse 78   Ht 5\' 6"  (1.676 m)   Wt 143 lb 12.8 oz (65.2 kg)   LMP  (LMP Unknown)   SpO2 99%   BMI 23.21 kg/m  Wt Readings from Last 3 Encounters:  09/27/23 143 lb 12.8 oz (65.2 kg)  09/26/23 142 lb (64.4 kg)  07/21/23 144 lb 6.4 oz (65.5 kg)   Constitutional: normal weight, in NAD Eyes:  no exophthalmos ENT: no neck masses, no cervical lymphadenopathy Cardiovascular: RRR, No MRG, mild B periankle edema Respiratory: CTA B Musculoskeletal: no deformities Skin:no rashes Neurological: + mild tremor with outstretched hands  Assessment: 1. Osteoporosis - she denies fractures but she has a decrease in height from 5 ft 8 in to 5 ft 6.5 in over the years  2.  Vitamin D insufficiency  3.  Elevated HbA1c  Plan: 1. Osteoporosis -Like age-related/postmenopausal and she also has family history of osteoporosis -No fractures or falls since last visit -We again discussed  about fall precautions especially since she has some instability in her feet due to pain.  Discussed about using toe spacers and memory foam soles (Sketchers). -The bone density scan from 09/2020 showed: Bone density improved at most sites with the exception of the left hip, which was worse, however, it may have been related to the fact that the 2019 T  score was an outlier, so we discussed about waiting for the new bone density at the end of this year to compare with the last study.  She had another bone density scan on 10/24/2022 at [facility] mammography.  Unfortunately, there was no formal comparison with the previous study, however, the right femoral neck score was still in the osteoporotic range. -She can use with Prolia injections, latest visit 07/21/2023 -She is tolerating Prolia well, without jaw/hip/thigh pain -We started Prolia in 2016.  We can continue for 10 years or more. -After we finished Prolia, we need to give her 1 or 2 years of bisphosphonates, possibly Reclast -We reviewed together the results of her latest BMP from 06/22/2023-GFR was only slightly low, at 65.5, while calcium level was normal, at 9.6.  Will not repeat this today -I recommended weightbearing exercises - at last visit she was doing only water walking.  -I will see her back in 1 year  2.  Vitamin D insufficiency -Vitamin D level was normal on 06/22/2023: 61.89. -She continues on 2000 units vitamin D daily -Will recheck the level at next visit  3.  Elevated HbA1c -HbA1c remains in the lower prediabetic range: Lab Results  Component Value Date   HGBA1C 6.0 06/22/2023   HGBA1C 6.0 01/03/2023   HGBA1C 6.1 09/26/2022   HGBA1C 6.0 03/28/2022   HGBA1C 6.2 12/27/2021   HGBA1C 5.9 (H) 08/27/2021   HGBA1C CANCELED 08/27/2021   HGBA1C 6.0 12/21/2020   HGBA1C 6.2 08/21/2020   HGBA1C 5.7 05/06/2020  -We previously discussed about cutting down fatty foods like eggs, cheese, meat  -No medications needed for now -HbA1c today: 5.9% (lower) -will continue to follow expectantly  Carlus Pavlov, MD PhD [facility] Endocr[facility]

## 2023-10-05 NOTE — Progress Notes (Signed)
GYNECOLOGY  VISIT   HPI: 77 y.o.   Married  Caucasian female   G1P1001 with No LMP recorded (lmp unknown). Patient is postmenopausal.   here for: U/S consult and endometrial biopsy.    Patient reported pelvic heaviness and brown discharge at her annual exam 09/26/23.  Urinalysis normal and UC was negative.   No further bleeding.   Known fibroids.   GYNECOLOGIC HISTORY: No LMP recorded (lmp unknown). Patient is postmenopausal. Contraception:  PMP Menopausal hormone therapy:  n/a Pap:      Component Value Date/Time   DIAGPAP  09/26/2023 1001    - Negative for intraepithelial lesion or malignancy (NILM)   DIAGPAP  09/22/2021 1427    - Negative for intraepithelial lesion or malignancy (NILM)   HPVHIGH Negative 09/26/2023 1001   ADEQPAP  09/26/2023 1001    Satisfactory for evaluation. The presence or absence of an   ADEQPAP  09/26/2023 1001    endocervical/transformation zone component cannot be determined because   ADEQPAP of atrophy. 09/26/2023 1001   History of abnormal Pap or positive HPV:  no Mammogram:  10/06/22 Breast Density Cat C, BI-RADS CAT 2 benign        OB History     Gravida  1   Para  1   Term  1   Preterm      AB  0   Living  1      SAB  0   IAB      Ectopic  0   Multiple      Live Births                 Patient Active Problem List   Diagnosis Date Noted   Raynaud's disease 01/03/2023   Hyperlipidemia- february 2020 coronary Calcium score of 0 12/12/2018   Vitamin D insufficiency 08/13/2018   Elevated glycosylated hemoglobin 08/13/2018   Rib pain on left side 03/21/2017   Scoliosis 03/21/2017   Hearing loss associated with syndrome of both ears 12/05/2016   Neuropathy 03/06/2012   Osteoporosis 06/29/2009   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   COLONIC POLYPS 05/25/2006   GERD 05/01/1999    Past Medical History:  Diagnosis Date   Basal cell carcinoma of skin    left ankle, right thigh   Bursitis    right  hip   Cancer (HCC) 2006   Breast/right   COVID 01/27/2021   Diverticulosis    GERD (gastroesophageal reflux disease)    History of breast cancer/33 radiation treatments/ no chemo    2006  S/P RIGHT LUMPECTOMY AND RADIATION FOLLOWED BY DR RUBIN--  NO RECURRENCE   History of kidney stones    Hyperplastic colon polyp    IBS (irritable bowel syndrome)    Idiopathic peripheral neuropathy    bilateral tingling/ due to fall in [facility]   Internal hemorrhoid    Kidney stone    Leiomyoma    Multiple small   Liver cyst 2006   Benign   OA (osteoarthritis)    Osteoporosis 2019   T score -2.5   Personal history of radiation therapy    Raynaud disease    Renal calculus, left    non obstructive   Rib pain on left side    able to lay on left side   Rosacea    Tinnitus     Past Surgical History:  Procedure Laterality Date   BREAST BIOPSY Left 2016   benign   BREAST EXCISIONAL BIOPSY  Right 1973   benign   BREAST LUMPECTOMY  2006   Right -- S/P RADIATION--  NO RECURRENCE   CATARACT EXTRACTION     DILATION AND CURETTAGE OF UTERUS     EXTRACORPOREAL SHOCK WAVE LITHOTRIPSY     X2   HYSTEROSCOPY     HYSTEROSCOPY WITH D & C  08/03/2012   Procedure: DILATATION AND CURETTAGE /HYSTEROSCOPY;  Surgeon: Dara Lords, MD;  Location: [facility];  Service: Gynecology;  Laterality: N/A;   SUPRANUMERARY NIPPLE EXCISION  1973   CYST   TONSILLECTOMY  AGE 89    Current Outpatient Medications  Medication Sig Dispense Refill   acetaminophen (TYLENOL) 500 MG tablet Take 1,000 mg by mouth 3 (three) times daily as needed.     Cholecalciferol (VITAMIN D PO) Take by mouth.     Cyanocobalamin (VITAMIN B 12 PO) Take by mouth.     denosumab (PROLIA) 60 MG/ML SOSY injection Inject 60 mg into the skin every 6 (six) months.     esomeprazole (NEXIUM) 20 MG capsule TAKE ONE CAPSULE BY MOUTH EVERY DAY AT NOON 90 capsule 3   rosuvastatin (CRESTOR) 10 MG tablet Take 1 tablet (10 mg total) by  mouth once a week. 13 tablet 3   No current facility-administered medications for this visit.     ALLERGIES: Triamcinolone acetonide [triamcinolone] and Amoxicillin  Family History  Problem Relation Age of Onset   Dementia Mother    Depression Mother    Osteoporosis Mother    Stroke Father    Heart attack Father 58   Stroke Sister        TIA. ? dementia. another stroke after covid then feeding issues led to eath   Dementia Sister    Rectal cancer Maternal Grandmother    Colon cancer Maternal Grandmother    Breast cancer Neg Hx    BRCA 1/2 Neg Hx     Social History   Socioeconomic History   Marital status: Married    Spouse name: Not on file   Number of children: 1   Years of education: Not on file   Highest education level: Not on file  Occupational History   Occupation: Retired-Teacher  Tobacco Use   Smoking status: Never   Smokeless tobacco: Never  Vaping Use   Vaping status: Never Used  Substance and Sexual Activity   Alcohol use: Yes    Alcohol/week: 7.0 standard drinks of alcohol    Types: 7 Standard drinks or equivalent per week   Drug use: No   Sexual activity: Not Currently    Birth control/protection: Post-menopausal    Comment: 1st intercourse 77 yo-Fewer than 5 partners, no STD, no abnormal pap  Other Topics Concern   Not on file  Social History Narrative   Married (husband patient of Dr. Durene Cal). 1 daughter. 1 granddaughter.       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       Hobbies: reading, travel, time with granddaughter. Very active   Social Determinants of Health   Financial Resource Strain: Low Risk  (03/02/2023)   Overall Financial Resource Strain (CARDIA)    Difficulty of Paying Living Expenses: Not hard at all  Food Insecurity: No Food Insecurity (03/02/2023)   Hunger Vital Sign    Worried About Running Out of Food in the Last Year: Never true    Ran Out of Food in the Last Year: Never true  Transportation Needs: No Transportation Needs  (03/02/2023)   PRAPARE - Transportation  Lack of Transportation (Medical): No    Lack of Transportation (Non-Medical): No  Physical Activity: Insufficiently Active (03/02/2023)   Exercise Vital Sign    Days of Exercise per Week: 2 days    Minutes of Exercise per Session: 50 min  Stress: No Stress Concern Present (03/02/2023)   [employer] of Occupational Health - Occupational Stress Questionnaire    Feeling of Stress : Only a little  Social Connections: Socially Integrated (03/02/2023)   Social Connection and Isolation Panel [NHANES]    Frequency of Communication with Friends and Family: More than three times a week    Frequency of Social Gatherings with Friends and Family: Three times a week    Attends Religious Services: More than 4 times per year    Active Member of Clubs or Organizations: Yes    Attends Banker Meetings: More than 4 times per year    Marital Status: Married  Catering manager Violence: Not At Risk (03/06/2023)   Humiliation, Afraid, Rape, and Kick questionnaire    Fear of Current or Ex-Partner: No    Emotionally Abused: No    Physically Abused: No    Sexually Abused: No    Review of Systems  All other systems reviewed and are negative.   PHYSICAL EXAMINATION:   BP 118/74 (BP Location: Left Arm, Patient Position: Sitting, Cuff Size: Normal)   Ht 5\' 6"  (1.676 m)   Wt 142 lb (64.4 kg)   LMP  (LMP Unknown)   BMI 22.92 kg/m     General appearance: alert, cooperative and appears stated age  EMB: Consent for procedure. Sterile prep with Hibiclens. Paracervical block with 1% lidocaine 10 cc, lot number   [id number], expiration 07/2025. Tenaculum to anterior cervical lip. Pipelle passed to   8       cm twice.   Tissue to pathology.  Minimal EBL. No complications.   Chaperone was present for exam:  Warren Lacy, CMA  Pelvic US  Uterus 7.41 x 4.87 x 3.76 cm. Fibroids:  1.35 cm, 2.07 cm, 1.28 cm, 1.76 cm, 1.55 cm.  Intramural and subserosal.  Some  calcified.  EMS 3.44 mm.  No masses.  Avascular.  Fibroids displace the uterine cavity.  Left ovary 1.26 x 0.92 x 0.67 cm.  Right ovary 1.21 x 0.78 x 0.77 cm.   ASSESSMENT:  Postmenopausal bleeding.  Uterine fibroids.  Hx breast cancer.   PLAN:  Pelvic US images and report reviewed with patient.  Fibroids discussed and their benign nature.  Etiologies of postmenopausal bleeding reviewed:  atrophy, polyp, infection, precancer, cancer.  FU EMB.  I anticipate observational management, but will await results.   20  total time was spent for this patient encounter, including preparation, face-to-face counseling with the patient, coordination of care, and documentation of the encounter in addition to doing the endometrial biopsy.   An After Visit Summary was provided to the patient.

## 2023-10-09 ENCOUNTER — Ambulatory Visit
Admission: RE | Admit: 2023-10-09 | Discharge: 2023-10-09 | Disposition: A | Discharge status: Home / Self Care | Payer: Medicare PPO | Source: Ambulatory Visit | Attending: Family Medicine | Admitting: Family Medicine

## 2023-10-09 DIAGNOSIS — Z1231 Encounter for screening mammogram for malignant neoplasm of breast: Secondary | ICD-10-CM | POA: Diagnosis not present

## 2023-10-09 DIAGNOSIS — Z0000 Encounter for general adult medical examination without abnormal findings: Secondary | ICD-10-CM

## 2023-10-10 DIAGNOSIS — H04123 Dry eye syndrome of bilateral lacrimal glands: Secondary | ICD-10-CM | POA: Diagnosis not present

## 2023-10-10 DIAGNOSIS — H00021 Hordeolum internum right upper eyelid: Secondary | ICD-10-CM | POA: Diagnosis not present

## 2023-10-10 DIAGNOSIS — H26493 Other secondary cataract, bilateral: Secondary | ICD-10-CM | POA: Diagnosis not present

## 2023-10-19 ENCOUNTER — Encounter: Payer: Self-pay | Admitting: Obstetrics and Gynecology

## 2023-10-19 ENCOUNTER — Ambulatory Visit (INDEPENDENT_AMBULATORY_CARE_PROVIDER_SITE_OTHER): Payer: Medicare PPO | Admitting: Obstetrics and Gynecology

## 2023-10-19 ENCOUNTER — Other Ambulatory Visit (HOSPITAL_COMMUNITY)
Admission: RE | Admit: 2023-10-19 | Discharge: 2023-10-19 | Disposition: A | Discharge status: Home / Self Care | Payer: Medicare PPO | Source: Ambulatory Visit | Attending: Obstetrics and Gynecology | Admitting: Obstetrics and Gynecology

## 2023-10-19 ENCOUNTER — Ambulatory Visit: Payer: Medicare PPO

## 2023-10-19 VITALS — BP 118/74 | Ht 66.0 in | Wt 142.0 lb

## 2023-10-19 DIAGNOSIS — D219 Benign neoplasm of connective and other soft tissue, unspecified: Secondary | ICD-10-CM

## 2023-10-19 DIAGNOSIS — N950 Postmenopausal bleeding: Secondary | ICD-10-CM

## 2023-10-19 NOTE — Patient Instructions (Signed)
 Endometrial Biopsy  An endometrial biopsy is a procedure where a tissue sample is removed from the lining of the uterus. This lining is called the endometrium. The tissue sample is then sent to a lab for testing. You may have this type of biopsy to check for: Cancer. Infection. Growths called polyps. Uterine bleeding that can't be explained. Tell a health care provider about: Any allergies you have. All medicines you're taking including vitamins, herbs, eye drops, creams, and over-the-counter medicines. Any problems you or family members have had with anesthesia. Any bleeding problems you have. Any surgeries you have had. Any medical problems you have. Whether you're pregnant or may be pregnant. What are the risks? Your health care provider will talk with you about risks. These may include: Bleeding. Infection. Allergic reactions to medicines. Damage to the wall of the uterus. This is rare. What happens before the procedure? Keep track of your period. You may need to have this biopsy when you're not having your period. Ask your provider about: Changing or stopping your regular medicines. These include any diabetes medicines or blood thinners you take. Taking medicines such as aspirin and ibuprofen. These medicines can thin your blood. Do not take them unless your provider tells you to. Taking over-the-counter medicines, vitamins, herbs, and supplements. Bring a pad with you. You may need to wear one after the biopsy. Plan to have a responsible adult take you home from the hospital or clinic. You won't be allowed to drive. What happens during the procedure? A tool will be put into your vagina to hold it open. This helps your provider see the cervix. The cervix is the lowest part of the uterus. Your cervix will be cleaned with a solution that kills germs. You will be given anesthesia. This keeps you from feeling pain. It will numb your cervix. A tool called forceps will be used to  hold your cervix steady. A thin tool called a uterine sound will be put through your cervix. It will be used to: Find the length of your uterus. Find where to take the sample from. A soft tube called a catheter will be put into your uterus. The catheter will remove a tissue sample. The tube and tools will be removed. The sample will be sent to a lab for testing. The procedure may vary among providers and hospitals. What happens after the procedure? Your blood pressure, heart rate, breathing rate, and blood oxygen level will be monitored until you leave the hospital or clinic. It's up to you to get the results of your procedure. Ask your provider, or the department that is doing the procedure, when your results will be ready. This information is not intended to replace advice given to you by your health care provider. Make sure you discuss any questions you have with your health care provider. Document Revised: 01/24/2023 Document Reviewed: 01/24/2023 Elsevier Patient Education  2024 [employer].

## 2023-10-20 LAB — SURGICAL PATHOLOGY

## 2023-10-23 ENCOUNTER — Encounter: Payer: Self-pay | Admitting: Obstetrics and Gynecology

## 2023-10-23 NOTE — Telephone Encounter (Signed)
Spoke with patient, advised per Dr. Edward Jolly. OV scheduled for 11/27 at 1600.   Patient states she checked her B/P at 1230, reports 155/89 and pulse of 88. States this is elevated for her. Temp 97.8 States she did not take any ibuprofen, was waiting for return call. Advised ok to ibuprofen, will provide update on B/P to Dr. Edward Jolly.   Routing to Dr. Edward Jolly.

## 2023-10-23 NOTE — Telephone Encounter (Signed)
Spoke with patient. EMB and PUS on 10/19/23.   Patient reports spotting on tissue when voiding today, denies any heavy bleeding. Reports tenderness in lower abdomen with movement and "heaviness" when sitting. Denies fever,  reports sweating last night. Denies odor, flank pain, urinary frequency or urgency. Is experiencing increase in urinary incontinence since 10/19/23. Patient reports she did receive Flu vaccine on 10/21/23. Planning to take another dose of motrin this morning.  Advised spotting not uncommon for up to 10 days after biopsy.   Advised patient I will provide update to Dr. Edward Jolly to review all symptoms and f/u with recommendations. Patient agreeable.   Dr. Edward Jolly -please review and advise.

## 2023-10-23 NOTE — Telephone Encounter (Signed)
Spoke with patient. Advised per Dr. Edward Jolly. Patient states she took 400 mg of Ibuprofen after we last spoke, discomfort has improved. She has not retaken her B/P, states "I feel like it has come down". Patient is aware to call if any concerns.   Routing to provider for final review. Patient is agreeable to disposition. Will close encounter.

## 2023-10-25 ENCOUNTER — Ambulatory Visit: Payer: Medicare PPO | Admitting: Obstetrics and Gynecology

## 2023-10-25 ENCOUNTER — Encounter: Payer: Self-pay | Admitting: Obstetrics and Gynecology

## 2023-10-25 VITALS — BP 120/78 | HR 100 | Temp 98.0°F | Ht 66.0 in | Wt 142.0 lb

## 2023-10-25 DIAGNOSIS — N952 Postmenopausal atrophic vaginitis: Secondary | ICD-10-CM | POA: Diagnosis not present

## 2023-10-25 DIAGNOSIS — N950 Postmenopausal bleeding: Secondary | ICD-10-CM

## 2023-10-25 DIAGNOSIS — D219 Benign neoplasm of connective and other soft tissue, unspecified: Secondary | ICD-10-CM | POA: Diagnosis not present

## 2023-10-25 NOTE — Progress Notes (Signed)
GYNECOLOGY  VISIT   HPI: 77 y.o.   Married  Caucasian female   G1P1001 with No LMP recorded (lmp unknown). Patient is postmenopausal.   here for: tenderness and bleeding after EMB. Pt has been spotting since Sunday as well as slight cramping.  Had red bleeding on Sunday.  Now brown blood.   Denies pain but notes cramping like a light period.  No fever.   Had a flu vaccine and T 97.8 the day she sent in her My Chart message.  Took 2 Advil.    No fever, shakes or chills.  EMB done 10/19/23 for postmenopausal bleeding. Pathology report showed strips of atrophic endometrium, negative for atypia, hyperplasia.  Polypoid fragment of benign endocervix noted.   Her pelvic US done 10/19/23 showed fibroids and normal ovaries. Her EMS was 3.44 mm.  Hx dilation and curettage 2013 with pathology showing benign endometrial polyp and leiomyoma.   GYNECOLOGIC HISTORY: No LMP recorded (lmp unknown). Patient is postmenopausal. Contraception:  PMP Menopausal hormone therapy:  n/a Last 2 paps:  09/26/23 neg: HR HPV neg, 09/22/21 neg History of abnormal Pap or positive HPV:  no Mammogram:  10/09/23 Breast Density Cat C, BI-RADS CAT 1 neg         OB History     Gravida  1   Para  1   Term  1   Preterm      AB  0   Living  1      SAB  0   IAB      Ectopic  0   Multiple      Live Births                 Patient Active Problem List   Diagnosis Date Noted   Raynaud's disease 01/03/2023   Hyperlipidemia- february 2020 coronary Calcium score of 0 12/12/2018   Vitamin D insufficiency 08/13/2018   Elevated glycosylated hemoglobin 08/13/2018   Rib pain on left side 03/21/2017   Scoliosis 03/21/2017   Hearing loss associated with syndrome of both ears 12/05/2016   Neuropathy 03/06/2012   Osteoporosis 06/29/2009   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   COLONIC POLYPS 05/25/2006   GERD 05/01/1999    Past Medical History:  Diagnosis Date   Basal cell carcinoma  of skin    left ankle, right thigh   Bursitis    right hip   Cancer (HCC) 2006   Breast/right   COVID 01/27/2021   Diverticulosis    GERD (gastroesophageal reflux disease)    History of breast cancer/33 radiation treatments/ no chemo    2006  S/P RIGHT LUMPECTOMY AND RADIATION FOLLOWED BY DR RUBIN--  NO RECURRENCE   History of kidney stones    Hyperplastic colon polyp    IBS (irritable bowel syndrome)    Idiopathic peripheral neuropathy    bilateral tingling/ due to fall in [facility]   Internal hemorrhoid    Kidney stone    Leiomyoma    Multiple small   Liver cyst 2006   Benign   OA (osteoarthritis)    Osteoporosis 2019   T score -2.5   Personal history of radiation therapy    Raynaud disease    Renal calculus, left    non obstructive   Rib pain on left side    able to lay on left side   Rosacea    Tinnitus     Past Surgical History:  Procedure Laterality Date   BREAST BIOPSY  Left 2016   benign   BREAST EXCISIONAL BIOPSY Right 1973   benign   BREAST LUMPECTOMY  2006   Right -- S/P RADIATION--  NO RECURRENCE   CATARACT EXTRACTION     DILATION AND CURETTAGE OF UTERUS     EXTRACORPOREAL SHOCK WAVE LITHOTRIPSY     X2   HYSTEROSCOPY     HYSTEROSCOPY WITH D & C  08/03/2012   Procedure: DILATATION AND CURETTAGE /HYSTEROSCOPY;  Surgeon: Dara Lords, MD;  Location: [facility];  Service: Gynecology;  Laterality: N/A;   SUPRANUMERARY NIPPLE EXCISION  1973   CYST   TONSILLECTOMY  AGE 38    Current Outpatient Medications  Medication Sig Dispense Refill   acetaminophen (TYLENOL) 500 MG tablet Take 1,000 mg by mouth 3 (three) times daily as needed.     Cholecalciferol (VITAMIN D PO) Take by mouth.     Cyanocobalamin (VITAMIN B 12 PO) Take by mouth.     denosumab (PROLIA) 60 MG/ML SOSY injection Inject 60 mg into the skin every 6 (six) months.     esomeprazole (NEXIUM) 20 MG capsule TAKE ONE CAPSULE BY MOUTH EVERY DAY AT NOON 90 capsule 3    rosuvastatin (CRESTOR) 10 MG tablet Take 1 tablet (10 mg total) by mouth once a week. 13 tablet 3   No current facility-administered medications for this visit.     ALLERGIES: Triamcinolone acetonide [triamcinolone] and Amoxicillin  Family History  Problem Relation Age of Onset   Dementia Mother    Depression Mother    Osteoporosis Mother    Stroke Father    Heart attack Father 13   Stroke Sister        TIA. ? dementia. another stroke after covid then feeding issues led to eath   Dementia Sister    Rectal cancer Maternal Grandmother    Colon cancer Maternal Grandmother    Breast cancer Neg Hx    BRCA 1/2 Neg Hx     Social History   Socioeconomic History   Marital status: Married    Spouse name: Not on file   Number of children: 1   Years of education: Not on file   Highest education level: Not on file  Occupational History   Occupation: Retired-Teacher  Tobacco Use   Smoking status: Never   Smokeless tobacco: Never  Vaping Use   Vaping status: Never Used  Substance and Sexual Activity   Alcohol use: Yes    Alcohol/week: 7.0 standard drinks of alcohol    Types: 7 Standard drinks or equivalent per week   Drug use: No   Sexual activity: Not Currently    Birth control/protection: Post-menopausal    Comment: 1st intercourse 77 yo-Fewer than 5 partners, no STD, no abnormal pap  Other Topics Concern   Not on file  Social History Narrative   Married (husband patient of Dr. Durene Cal). 1 daughter. 1 granddaughter.       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       Hobbies: reading, travel, time with granddaughter. Very active   Social Determinants of Health   Financial Resource Strain: Low Risk  (03/02/2023)   Overall Financial Resource Strain (CARDIA)    Difficulty of Paying Living Expenses: Not hard at all  Food Insecurity: No Food Insecurity (03/02/2023)   Hunger Vital Sign    Worried About Running Out of Food in the Last Year: Never true    Ran Out of Food in the Last  Year: Never true  Transportation  Needs: No Transportation Needs (03/02/2023)   PRAPARE - Administrator, Civil Service (Medical): No    Lack of Transportation (Non-Medical): No  Physical Activity: Insufficiently Active (03/02/2023)   Exercise Vital Sign    Days of Exercise per Week: 2 days    Minutes of Exercise per Session: 50 min  Stress: No Stress Concern Present (03/02/2023)   [employer] of Occupational Health - Occupational Stress Questionnaire    Feeling of Stress : Only a little  Social Connections: Socially Integrated (03/02/2023)   Social Connection and Isolation Panel [NHANES]    Frequency of Communication with Friends and Family: More than three times a week    Frequency of Social Gatherings with Friends and Family: Three times a week    Attends Religious Services: More than 4 times per year    Active Member of Clubs or Organizations: Yes    Attends Banker Meetings: More than 4 times per year    Marital Status: Married  Catering manager Violence: Not At Risk (03/06/2023)   Humiliation, Afraid, Rape, and Kick questionnaire    Fear of Current or Ex-Partner: No    Emotionally Abused: No    Physically Abused: No    Sexually Abused: No    Review of Systems  Genitourinary:  Positive for vaginal bleeding.  All other systems reviewed and are negative.   PHYSICAL EXAMINATION:   BP 120/78 (BP Location: Left Arm, Patient Position: Sitting, Cuff Size: Normal)   Pulse 100   Ht 5\' 6"  (1.676 m)   Wt 142 lb (64.4 kg)   LMP  (LMP Unknown)   SpO2 96%   BMI 22.92 kg/m     General appearance: alert, cooperative and appears stated age   Pelvic: External genitalia:  no lesions              Urethra:  normal appearing urethra with no masses, tenderness or lesions              Bartholins and Skenes: normal                 Vagina:  generalized atrophy of the vaginal noted.  Small amount of dark blood at vaginal cuff.                Cervix: no lesions, no CMT.                 Bimanual Exam:  Uterus:  normal size, contour, position, consistency, mobility, non-tender              Adnexa: no mass, fullness, tenderness             Chaperone was present for exam:  Warren Lacy, CMA  ASSESSMENT:  Postmenopausal bleeding.  Status post endometrial biopsy - benign.  Significant vaginal atrophy noted today.  No sign of endometritis.  Fibroids.  Hx endometrial polyp noted on prior hysteroscopy.  Hx breast cancer.   PLAN:  Pathology report reviewed.  Pelvic US images reviewed.  If bleeding persists or recurs, will do sonohysterogram.   Alternative treatment option is a hysteroscopy, which the patient is hoping to avoid.  Pelvic rest until the bleeding stops.   25 min  total time was spent for this patient encounter, including preparation, face-to-face counseling with the patient, coordination of care, and documentation of the encounter.

## 2023-11-13 ENCOUNTER — Encounter: Payer: Self-pay | Admitting: Family Medicine

## 2023-11-13 ENCOUNTER — Encounter: Payer: Self-pay | Admitting: Internal Medicine

## 2023-11-13 DIAGNOSIS — H35373 Puckering of macula, bilateral: Secondary | ICD-10-CM | POA: Diagnosis not present

## 2023-11-13 DIAGNOSIS — H02825 Cysts of left lower eyelid: Secondary | ICD-10-CM | POA: Diagnosis not present

## 2023-11-13 DIAGNOSIS — H26493 Other secondary cataract, bilateral: Secondary | ICD-10-CM | POA: Diagnosis not present

## 2023-11-13 DIAGNOSIS — H40013 Open angle with borderline findings, low risk, bilateral: Secondary | ICD-10-CM | POA: Diagnosis not present

## 2023-11-13 DIAGNOSIS — H04123 Dry eye syndrome of bilateral lacrimal glands: Secondary | ICD-10-CM | POA: Diagnosis not present

## 2023-11-13 LAB — HM DIABETES EYE EXAM

## 2023-11-17 ENCOUNTER — Encounter: Payer: Self-pay | Admitting: Internal Medicine

## 2023-12-05 DIAGNOSIS — H00014 Hordeolum externum left upper eyelid: Secondary | ICD-10-CM | POA: Diagnosis not present

## 2023-12-05 DIAGNOSIS — H0102B Squamous blepharitis left eye, upper and lower eyelids: Secondary | ICD-10-CM | POA: Diagnosis not present

## 2023-12-05 DIAGNOSIS — H0102A Squamous blepharitis right eye, upper and lower eyelids: Secondary | ICD-10-CM | POA: Diagnosis not present

## 2023-12-22 ENCOUNTER — Encounter: Payer: Self-pay | Admitting: Family Medicine

## 2023-12-25 ENCOUNTER — Other Ambulatory Visit: Payer: Self-pay

## 2023-12-25 DIAGNOSIS — E559 Vitamin D deficiency, unspecified: Secondary | ICD-10-CM

## 2023-12-25 DIAGNOSIS — Z79899 Other long term (current) drug therapy: Secondary | ICD-10-CM

## 2023-12-25 DIAGNOSIS — E049 Nontoxic goiter, unspecified: Secondary | ICD-10-CM

## 2023-12-25 DIAGNOSIS — R7303 Prediabetes: Secondary | ICD-10-CM

## 2023-12-25 DIAGNOSIS — E785 Hyperlipidemia, unspecified: Secondary | ICD-10-CM

## 2023-12-27 ENCOUNTER — Other Ambulatory Visit: Payer: Medicare PPO

## 2023-12-27 DIAGNOSIS — R7303 Prediabetes: Secondary | ICD-10-CM | POA: Diagnosis not present

## 2023-12-27 DIAGNOSIS — E785 Hyperlipidemia, unspecified: Secondary | ICD-10-CM | POA: Diagnosis not present

## 2023-12-27 DIAGNOSIS — E559 Vitamin D deficiency, unspecified: Secondary | ICD-10-CM

## 2023-12-27 DIAGNOSIS — Z79899 Other long term (current) drug therapy: Secondary | ICD-10-CM | POA: Diagnosis not present

## 2023-12-27 DIAGNOSIS — E049 Nontoxic goiter, unspecified: Secondary | ICD-10-CM

## 2023-12-27 LAB — CBC WITH DIFFERENTIAL/PLATELET
Basophils Absolute: 0 10*3/uL (ref 0.0–0.1)
Basophils Relative: 0.3 % (ref 0.0–3.0)
Eosinophils Absolute: 0.1 10*3/uL (ref 0.0–0.7)
Eosinophils Relative: 1.4 % (ref 0.0–5.0)
HCT: 43.2 % (ref 36.0–46.0)
Hemoglobin: 14.2 g/dL (ref 12.0–15.0)
Lymphocytes Relative: 39.2 % (ref 12.0–46.0)
Lymphs Abs: 2.3 10*3/uL (ref 0.7–4.0)
MCHC: 33 g/dL (ref 30.0–36.0)
MCV: 92.9 fL (ref 78.0–100.0)
Monocytes Absolute: 0.4 10*3/uL (ref 0.1–1.0)
Monocytes Relative: 7 % (ref 3.0–12.0)
Neutro Abs: 3 10*3/uL (ref 1.4–7.7)
Neutrophils Relative %: 52.1 % (ref 43.0–77.0)
Platelets: 297 10*3/uL (ref 150.0–400.0)
RBC: 4.65 Mil/uL (ref 3.87–5.11)
RDW: 13.5 % (ref 11.5–15.5)
WBC: 5.7 10*3/uL (ref 4.0–10.5)

## 2023-12-27 LAB — VITAMIN B12: Vitamin B-12: 1061 pg/mL — ABNORMAL HIGH (ref 211–911)

## 2023-12-27 LAB — LIPID PANEL
Cholesterol: 203 mg/dL — ABNORMAL HIGH (ref 0–200)
HDL: 79.7 mg/dL (ref >39.00)
LDL Cholesterol: 105 mg/dL — ABNORMAL HIGH (ref 0–99)
NonHDL: 122.84
Total CHOL/HDL Ratio: 3
Triglycerides: 88 mg/dL (ref 0.0–149.0)
VLDL: 17.6 mg/dL (ref 0.0–40.0)

## 2023-12-27 LAB — COMPREHENSIVE METABOLIC PANEL
ALT: 11 U/L (ref 0–35)
AST: 18 U/L (ref 0–37)
Albumin: 4.7 g/dL (ref 3.5–5.2)
Alkaline Phosphatase: 54 U/L (ref 39–117)
BUN: 17 mg/dL (ref 6–23)
CO2: 31 meq/L (ref 19–32)
Calcium: 9.2 mg/dL (ref 8.4–10.5)
Chloride: 102 meq/L (ref 96–112)
Creatinine, Ser: 0.92 mg/dL (ref 0.40–1.20)
GFR: 59.32 mL/min — ABNORMAL LOW (ref >60.00)
Glucose, Bld: 95 mg/dL (ref 70–99)
Potassium: 4.3 meq/L (ref 3.5–5.1)
Sodium: 140 meq/L (ref 135–145)
Total Bilirubin: 0.5 mg/dL (ref 0.2–1.2)
Total Protein: 6.9 g/dL (ref 6.0–8.3)

## 2023-12-27 LAB — TSH: TSH: 1.46 u[IU]/mL (ref 0.35–5.50)

## 2023-12-27 LAB — VITAMIN D 25 HYDROXY (VIT D DEFICIENCY, FRACTURES): VITD: 55.3 ng/mL (ref 30.00–100.00)

## 2023-12-27 LAB — HEMOGLOBIN A1C: Hgb A1c MFr Bld: 6.2 % (ref 4.6–6.5)

## 2023-12-28 ENCOUNTER — Encounter: Payer: Self-pay | Admitting: Family Medicine

## 2024-01-02 NOTE — Telephone Encounter (Signed)
Prolia VOB initiated via [email or web address]  Next Prolia inj DUE: 01/22/24

## 2024-01-04 ENCOUNTER — Other Ambulatory Visit: Payer: Self-pay | Admitting: Family Medicine

## 2024-01-05 ENCOUNTER — Ambulatory Visit (INDEPENDENT_AMBULATORY_CARE_PROVIDER_SITE_OTHER): Payer: Medicare PPO | Admitting: Family Medicine

## 2024-01-05 ENCOUNTER — Encounter: Payer: Self-pay | Admitting: Family Medicine

## 2024-01-05 VITALS — BP 130/80 | HR 82 | Temp 97.2°F | Ht 66.0 in | Wt 139.4 lb

## 2024-01-05 DIAGNOSIS — Z0001 Encounter for general adult medical examination with abnormal findings: Secondary | ICD-10-CM

## 2024-01-05 DIAGNOSIS — K219 Gastro-esophageal reflux disease without esophagitis: Secondary | ICD-10-CM | POA: Diagnosis not present

## 2024-01-05 DIAGNOSIS — E785 Hyperlipidemia, unspecified: Secondary | ICD-10-CM

## 2024-01-05 MED ORDER — FAMOTIDINE 10 MG PO TABS: 10.0000 mg | ORAL_TABLET | Freq: Two times a day (BID) | ORAL | 11 refills | Status: DC

## 2024-01-05 NOTE — Progress Notes (Signed)
 Phone [phone] In person visit   Subjective:   Dana Chambers is a 78 y.o. 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     Cholecalciferol (VITAMIN D  PO) Take by mouth.     Cyanocobalamin  (VITAMIN B 12 PO) Take by mouth.     denosumab  (PROLIA ) 60 MG/ML SOSY injection Inject 60 mg into the skin every 6 (six) months.     esomeprazole  (NEXIUM ) 20 MG capsule TAKE ONE CAPSULE BY MOUTH EVERY DAY AT NOON 90 capsule 3   famotidine  (PEPCID ) 10 MG tablet Take 1 tablet (10 mg total) by mouth 2 (two) times daily. 60 tablet 11   rosuvastatin  (CRESTOR ) 10 MG tablet Take 1 tablet (10 mg total) by mouth once a week. 13 tablet 3   No current facility-administered medications for this visit.     Objective:  BP 130/80   Pulse 82   Temp (!) 97.2 F (36.2 C)   Ht 5' 6 (1.676 m)   Wt 139 lb 6.4 oz (63.2 kg)   LMP  (LMP Unknown)   SpO2 97%   BMI 22.50 kg/m  Gen: NAD, resting  comfortably No upper abdominal pain    Assessment and Plan   GERD S:Medication: Nexium  20 mg in morning but after meal- has required 40 mg in the past- has noted som eupper abdominal tightness and belching- worse after dinner but could be anytime. More gas lately too.  -With osteoporosis history prefer lowest dose possible  -for flatulence- tried gas x and helped frequency but odor was worse so stopped B12 levels related to PPI use: Low normal in 2019 and empirically takes B12 now-slightly high but will urinate out the extra Lab Results  Component Value Date   VITAMINB12 1,061 (H) 12/27/2023  A/P: GERD appears poorly controlled- she is going to move her Nexium  to before breakfast and can then try just famotidine  10 mg before dinner - if not doing better within a couple weeks can take famotidine  before bed  -she stopped B12 for a week but we discussed could restart just weekly.   Recommended follow up: Return in about 6 months (around 07/04/2024) for followup or sooner if needed.Schedule b4 you leave. Future Appointments  Date Time Provider Department Center  01/24/2024 10:00 AM [facility] NURSE [facility] None  03/11/2024  8:15 AM [facility] ANNUAL [facility]  VISIT 1 [facility]  09/30/2024 10:00 AM Trixie File, MD [facility] None  01/10/2025  8:20 AM Katrinka Garnette KIDD, MD [facility]    Lab/Order associations:   ICD-10-CM   1. Gastroesophageal reflux disease, unspecified whether esophagitis present  K21.9      Meds ordered this encounter  Medications   famotidine  (PEPCID ) 10 MG tablet    Sig: Take 1 tablet (10 mg total) by mouth 2 (two) times daily.    Dispense:  60 tablet    Refill:  11    Return precautions advised.  Garnette Katrinka, MD

## 2024-01-05 NOTE — Assessment & Plan Note (Addendum)
 S:Medication: Nexium  20 mg in morning but after meal- has required 40 mg in the past- has noted som eupper abdominal tightness and belching- worse after dinner but could be anytime. More gas lately too.  -With osteoporosis history prefer lowest dose possible  -for flatulence- tried gas x and helped frequency but odor was worse so stopped B12 levels related to PPI use: Low normal in 2019 and empirically takes B12 now-slightly high but will urinate out the extra Lab Results  Component Value Date   VITAMINB12 1,061 (H) 12/27/2023  A/P: GERD appears poorly controlled- she is going to move her Nexium  to before breakfast and can then try just famotidine  10 mg before dinner - if not doing better within a couple weeks can take famotidine  before bed  -she stopped B12 for a week but we discussed could restart just weekly.

## 2024-01-05 NOTE — Patient Instructions (Addendum)
 GERD appears poorly controlled- she is going to move her Nexium  to before breakfast and can then try just famotidine  10 mg before dinner - if not doing better within a couple weeks can take famotidine  before bed as well- sent to pharmacy -see us  sooner if not improving  We will call you within two weeks about your referral to CT calcium  scoring through [facility].  Their phone number is [phone].  Please call them if you have not heard in 1-2 weeks  Tetanus, Diphtheria, and Pertussis (Tdap) at pharmacy due  Recommended follow up: Return in about 6 months (around 07/04/2024) for followup or sooner if needed.Schedule b4 you leave.

## 2024-01-05 NOTE — Progress Notes (Signed)
 Phone [phone]   Subjective:  Patient presents today for their annual physical. Chief complaint-noted.   See problem oriented charting- ROS- full  review of systems was completed and negative except for: reflux, gas  The following were reviewed and entered/updated in epic: Past Medical History:  Diagnosis Date   Basal cell carcinoma of skin    left ankle, right thigh   Bursitis    right hip   Cancer (HCC) 2006   Breast/right   Cataract    COVID 01/27/2021   Diverticulosis    GERD (gastroesophageal reflux disease)    History of breast cancer/33 radiation treatments/ no chemo    2006  S/P RIGHT LUMPECTOMY AND RADIATION FOLLOWED BY DR RUBIN--  NO RECURRENCE   History of kidney stones    Hyperplastic colon polyp    IBS (irritable bowel syndrome)    Idiopathic peripheral neuropathy    bilateral tingling/ due to fall in [facility]   Internal hemorrhoid    Kidney stone    Leiomyoma    Multiple small   Liver cyst 2006   Benign   OA (osteoarthritis)    Osteoporosis 2019   T score -2.5   Personal history of radiation therapy    Raynaud disease    Renal calculus, left    non obstructive   Rib pain on left side    able to lay on left side   Rosacea    Tinnitus    Patient Active Problem List   Diagnosis Date Noted   Raynaud's disease 01/03/2023    Priority: Medium    Hyperlipidemia- february 2020 coronary Calcium  score of 0 12/12/2018    Priority: Medium    Vitamin D  insufficiency 08/13/2018    Priority: Medium    Elevated glycosylated hemoglobin 08/13/2018    Priority: Medium    Rib pain on left side 03/21/2017    Priority: Medium    Scoliosis 03/21/2017    Priority: Medium    Osteoporosis 06/29/2009    Priority: Medium    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    Priority: Medium    GERD 05/01/1999    Priority: Medium    Hearing loss associated with syndrome of both ears 12/05/2016    Priority: Low   Neuropathy 03/06/2012    Priority: Low    COLONIC POLYPS 05/25/2006    Priority: Low   Past Surgical History:  Procedure Laterality Date   BREAST BIOPSY Left 2016   benign   BREAST EXCISIONAL BIOPSY Right 1973   benign   BREAST LUMPECTOMY  2006   Right -- S/P RADIATION--  NO RECURRENCE   CATARACT EXTRACTION     DILATION AND CURETTAGE OF UTERUS     EXTRACORPOREAL SHOCK WAVE LITHOTRIPSY     X2   HYSTEROSCOPY     HYSTEROSCOPY WITH D & C  08/03/2012   Procedure: DILATATION AND CURETTAGE /HYSTEROSCOPY;  Surgeon: Evalene SHAUNNA Organ, MD;  Location: [facility];  Service: Gynecology;  Laterality: N/A;   SUPRANUMERARY NIPPLE EXCISION  1973   CYST   TONSILLECTOMY  AGE 68    Family History  Problem Relation Age of Onset   Dementia Mother    Depression Mother    Osteoporosis Mother    Arthritis Mother    Stroke Father    Heart attack Father 5   Early death Father    Heart disease Father    Stroke Sister        TIA. ? dementia. another stroke  after covid then feeding issues led to eath   Dementia Sister    Rectal cancer Maternal Grandmother    Colon cancer Maternal Grandmother    Cancer Maternal Grandmother    Breast cancer Neg Hx    BRCA 1/2 Neg Hx     Medications- reviewed and updated Current Outpatient Medications  Medication Sig Dispense Refill   acetaminophen  (TYLENOL ) 500 MG tablet Take 1,000 mg by mouth 3 (three) times daily as needed.     Cholecalciferol (VITAMIN D  PO) Take by mouth.     Cyanocobalamin  (VITAMIN B 12 PO) Take by mouth.     denosumab  (PROLIA ) 60 MG/ML SOSY injection Inject 60 mg into the skin every 6 (six) months.     esomeprazole  (NEXIUM ) 20 MG capsule TAKE ONE CAPSULE BY MOUTH EVERY DAY AT NOON 90 capsule 3   famotidine  (PEPCID ) 10 MG tablet Take 1 tablet (10 mg total) by mouth 2 (two) times daily. 60 tablet 11   rosuvastatin  (CRESTOR ) 10 MG tablet Take 1 tablet (10 mg total) by mouth once a week. 13 tablet 3   No current facility-administered medications for this visit.     Allergies-reviewed and updated Allergies  Allergen Reactions   Triamcinolone  Acetonide [Triamcinolone ] Other (See Comments)    Red cheeks and nose, elevated heart rate   Amoxicillin Diarrhea    GI upset    Social History   Social History Narrative   Married (husband patient of Dr. Katrinka). 1 daughter. 1 granddaughter.       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       Hobbies: reading, travel, time with granddaughter. Very active   Objective  Objective:  BP 130/80   Pulse 82   Temp (!) 97.2 F (36.2 C)   Ht 5' 6 (1.676 m)   Wt 139 lb 6.4 oz (63.2 kg)   LMP  (LMP Unknown)   SpO2 97%   BMI 22.50 kg/m  Gen: NAD, resting comfortably HEENT: Mucous membranes are moist. Oropharynx normal Neck: no thyromegaly- known thyroid  nodules, no lymphadenopathy CV: RRR no murmurs rubs or gallops Lungs: CTAB no crackles, wheeze, rhonchi Abdomen: soft/nontender/nondistended/normal bowel sounds. No rebound or guarding.  Ext: no edema Skin: warm, dry Neuro: grossly normal, moves all extremities, PERRLA   Assessment and Plan   78 y.o. female presenting for annual physical.  Health Maintenance counseling: 1. Anticipatory guidance: Patient counseled regarding regular dental exams -q6 months, eye exams - follow up with Dr. Cleatus- was not pleased with cataract surgery or follow up laser surgery,  avoiding smoking and second hand smoke , limiting alcohol to 1 beverage per day- maybe 3 a week- has cut down , no illicit drugs .   2. Risk factor reduction:  Advised patient of need for regular exercise and diet rich and fruits and vegetables to reduce risk of heart attack and stroke.  Exercise-continues her water walking- has kept this up twice a week.  Diet/weight management-weight down 4 pounds from last year- trying to eat cleaner.  Wt Readings from Last 3 Encounters:  01/05/24 139 lb 6.4 oz (63.2 kg)  10/25/23 142 lb (64.4 kg)  10/19/23 142 lb (64.4 kg)  3.  Immunizations/screenings/ancillary studies-discussed Tdap at pharmacy again, declines COVID, offered option of Prevnar 20 but has had both Pneumovax 23 and Prevnar 13  Immunization History  Administered Date(s) Administered   Fluad Quad(high Dose 65+) 07/29/2020, 08/12/2022   Influenza Whole 10/29/2009, 08/11/2010, 08/19/2011   Influenza, High Dose Seasonal PF 09/29/2016, 08/21/2017, 08/01/2018,  07/15/2019, 07/29/2020, 07/21/2021, 08/19/2022, 10/21/2023   Influenza,inj,Quad PF,6+ Mos 08/21/2013, 09/02/2014   Influenza-Unspecified 09/09/2015, 08/01/2018, 07/17/2019   Moderna Covid-19 Fall Seasonal Vaccine 66yrs & older 10/13/2023   PFIZER Comirnaty(Gray Top)Covid-19 Tri-Sucrose Vaccine 04/08/2021, 08/19/2022   PFIZER(Purple Top)SARS-COV-2 Vaccination 12/23/2019, 01/13/2020, 07/31/2020   Pfizer Covid-19 Vaccine Bivalent Booster 52yrs & up 08/11/2021, 04/06/2022   Pneumococcal Conjugate-13 09/02/2014   Pneumococcal Polysaccharide-23 03/06/2012   Respiratory Syncytial Virus Vaccine,Recomb Aduvanted(Arexvy) 08/19/2022   Tdap 03/06/2012   Zoster Recombinant(Shingrix) 03/10/2018, 05/10/2018  4. Cervical cancer screening- past age based screening recommendations for pap smears but ... -Of note did have postmenopausal bleeding in 2024 with reassuring workup from GYN including endometrial biopsy and pelvic ultrasound.  Plan if persistent issues was for sonohysterogram or hysteroscopy- no more bleeding since that time  5. Breast cancer screening-  breast exam-self exams monthly and sees GYN-and mammogram 10/09/2023 with history of Paget's 6. Colon cancer screening -was released from gastroenterology due to age for screening purposes 7. Skin cancer screening-sees Dr. Arlen yearly- sees in next month. advised regular sunscreen use. Denies worrisome, changing, or new skin lesions- a few spots she will have dermatology .  8. Birth control/STD check- only active with husband/postmenopausal 9.  Osteoporosis screening at 65-osteoporosis follow-up with endocrinology 10. Smoking associated screening -never smoker  Status of chronic or acute concerns   #social update - coming up on masters trip  # Bilateral hip pain-ended up being diagnosed with trochanteric bursitis by Dr. Ernie. Comes and goes now  #hyperlipidemia-patient with history of coronary calcium  score of 0 on 01/16/2019. S: Medication: Rosuvastatin  10 mg once a week -has noted mild palpitations- its not that she is noting clear irregularity- its more feeling presence of heart beat when she lays down when didn't use to. Also is almost pressure in upper abdomen/lower chest and could also be reflux.  Lab Results  Component Value Date   CHOL 203 (H) 12/27/2023   HDL 79.70 12/27/2023   LDLCALC 105 (H) 12/27/2023   LDLDIRECT 168.5 02/02/2012   TRIG 88.0 12/27/2023   CHOLHDL 3 12/27/2023  A/P: lipids are improved with LDL down to 105 on just once a week rosuvastatin  10 mg- we discussed ideal LDL would be under 70 but with level of improvement we could hold steady on dose- since we are updating CT calcium  scoring could also wait to see if any worsening and increase dose if worsening-we are trying of the be cautious on statin with a1c going up slightly back on statin  # GERD  - see problem oriented note  # Hyperglycemia/insulin resistance/prediabetes-peak A1c of 6.2 in the past-increased while on statin S:  Medication: none Lab Results  Component Value Date   HGBA1C 6.2 12/27/2023   HGBA1C 6.0 06/22/2023   HGBA1C 6.0 01/03/2023  A/P: roughly stable- continue to monitor every 6 months  # Osteoporosis-follows with Dr. Trixie S: Last DEXA: 10/24/22 with slight worsening in left femur neck  Medication: Prolia  through Dr. Trixie  Calcium : 1200mg  (through diet ok) recommended - she states with calcium  related stones in past has opted out- advised to discuss with Dr. Trixie Vitamin D : 1000 units a day recommended-vitamin D   has been Last vitamin D  Lab Results  Component Value Date   VD25OH 55.30 12/27/2023  A/P: overall stable suspected- continue current medications and follow up with endocrine   #Multinodular goiter-noted on ultrasound Apr 21, 2021-no areas met criteria for biopsy. - Patient reports no recnet issues - Check TSH at least annually- reassuring  today  Lab Results  Component Value Date   TSH 1.46 12/27/2023   #Neuropathy- simply tolerates - years of issues but is worse with cold weather. Feels more cold- TSH has been normal. We offered neurology consult- she wants to think this over  #GFR hair below 60 but creatinine roughtly stable  Recommended follow up: Return in about 6 months (around 07/04/2024) for followup or sooner if needed.Schedule b4 you leave. Future Appointments  Date Time Provider Department Center  01/24/2024 10:00 AM [facility] NURSE [facility] None  03/11/2024  8:15 AM [facility] ANNUAL WELLNESS VISIT 1 [facility]  09/30/2024 10:00 AM Trixie File, MD [facility] None  01/10/2025  8:20 AM Katrinka Garnette KIDD, MD [facility]   Lab/Order associations:already did  fasting labs   ICD-10-CM   1. Encounter for general adult medical examination with abnormal findings  Z00.01     2. Hyperlipidemia, unspecified hyperlipidemia type  E78.5 CT CARDIAC SCORING ([facility] LOCATIONS ONLY)    3. Gastroesophageal reflux disease, unspecified whether esophagitis present  K21.9       Meds ordered this encounter  Medications   famotidine  (PEPCID ) 10 MG tablet    Sig: Take 1 tablet (10 mg total) by mouth 2 (two) times daily.    Dispense:  60 tablet    Refill:  11    Return precautions advised.  Garnette Katrinka, MD

## 2024-01-09 NOTE — Telephone Encounter (Signed)
Medical Buy and Bill  Prior Authorization REQUIRED  PA PROCESS DETAILS: PA is required. PA can be initiated by calling [phone] or online at [email or web address] authorizations-professionally-administered-drugs.

## 2024-01-21 NOTE — Telephone Encounter (Signed)
 Prior Authorization initiated for PROLIA via [email or web address] KEY: [id number]

## 2024-01-24 ENCOUNTER — Ambulatory Visit (INDEPENDENT_AMBULATORY_CARE_PROVIDER_SITE_OTHER): Payer: Medicare PPO

## 2024-01-24 DIAGNOSIS — M818 Other osteoporosis without current pathological fracture: Secondary | ICD-10-CM | POA: Diagnosis not present

## 2024-01-24 MED ORDER — DENOSUMAB 60 MG/ML ~~LOC~~ SOSY
60.0000 mg | PREFILLED_SYRINGE | Freq: Once | SUBCUTANEOUS | Status: AC
  Administered 2024-01-24: 60 mg via SUBCUTANEOUS

## 2024-01-24 MED ORDER — DENOSUMAB 60 MG/ML ~~LOC~~ SOSY
60.0000 mg | PREFILLED_SYRINGE | Freq: Once | SUBCUTANEOUS | Status: AC
  Administered 2024-08-09: 60 mg via SUBCUTANEOUS

## 2024-01-24 NOTE — Progress Notes (Signed)
 After obtaining consent, and per orders of Dr. Elvera Lennox, injection of Prolia 60 mg given by Pollie Meyer. Patient instructed to remain in clinic for 20 minutes afterwards, and to report any adverse reaction to me immediately.

## 2024-01-24 NOTE — Telephone Encounter (Signed)
 APPROVED

## 2024-01-27 NOTE — Telephone Encounter (Signed)
 Medical Buy and Annette Stable   Prior Authorization for [employer] APPROVED PA# [id number] Valid: 05/15/20-11/27/24

## 2024-01-27 NOTE — Telephone Encounter (Signed)
 Last Prolia inj 01/24/24 Next Prolia inj due 07/24/24

## 2024-01-29 DIAGNOSIS — H04123 Dry eye syndrome of bilateral lacrimal glands: Secondary | ICD-10-CM | POA: Diagnosis not present

## 2024-01-29 DIAGNOSIS — H00025 Hordeolum internum left lower eyelid: Secondary | ICD-10-CM | POA: Diagnosis not present

## 2024-01-31 ENCOUNTER — Ambulatory Visit
Admission: RE | Admit: 2024-01-31 | Discharge: 2024-01-31 | Disposition: A | Discharge status: Home / Self Care | Payer: Self-pay | Source: Ambulatory Visit | Attending: Family Medicine | Admitting: Family Medicine

## 2024-01-31 ENCOUNTER — Other Ambulatory Visit: Payer: Medicare PPO

## 2024-01-31 DIAGNOSIS — E785 Hyperlipidemia, unspecified: Secondary | ICD-10-CM

## 2024-02-05 ENCOUNTER — Encounter: Payer: Self-pay | Admitting: Family Medicine

## 2024-02-14 DIAGNOSIS — D225 Melanocytic nevi of trunk: Secondary | ICD-10-CM | POA: Diagnosis not present

## 2024-02-14 DIAGNOSIS — L853 Xerosis cutis: Secondary | ICD-10-CM | POA: Diagnosis not present

## 2024-02-14 DIAGNOSIS — D2271 Melanocytic nevi of right lower limb, including hip: Secondary | ICD-10-CM | POA: Diagnosis not present

## 2024-02-14 DIAGNOSIS — D2261 Melanocytic nevi of right upper limb, including shoulder: Secondary | ICD-10-CM | POA: Diagnosis not present

## 2024-02-14 DIAGNOSIS — L814 Other melanin hyperpigmentation: Secondary | ICD-10-CM | POA: Diagnosis not present

## 2024-02-14 DIAGNOSIS — L821 Other seborrheic keratosis: Secondary | ICD-10-CM | POA: Diagnosis not present

## 2024-02-14 DIAGNOSIS — Z85828 Personal history of other malignant neoplasm of skin: Secondary | ICD-10-CM | POA: Diagnosis not present

## 2024-02-16 DIAGNOSIS — H04123 Dry eye syndrome of bilateral lacrimal glands: Secondary | ICD-10-CM | POA: Diagnosis not present

## 2024-02-16 DIAGNOSIS — H0102B Squamous blepharitis left eye, upper and lower eyelids: Secondary | ICD-10-CM | POA: Diagnosis not present

## 2024-02-16 DIAGNOSIS — B880 Other acariasis: Secondary | ICD-10-CM | POA: Diagnosis not present

## 2024-02-16 DIAGNOSIS — H0102A Squamous blepharitis right eye, upper and lower eyelids: Secondary | ICD-10-CM | POA: Diagnosis not present

## 2024-02-20 ENCOUNTER — Ambulatory Visit: Payer: Self-pay | Admitting: Family Medicine

## 2024-02-20 ENCOUNTER — Encounter: Payer: Self-pay | Admitting: Family Medicine

## 2024-02-20 VITALS — BP 102/60 | HR 75 | Temp 97.0°F | Ht 66.0 in | Wt 141.4 lb

## 2024-02-20 DIAGNOSIS — R739 Hyperglycemia, unspecified: Secondary | ICD-10-CM

## 2024-02-20 DIAGNOSIS — R002 Palpitations: Secondary | ICD-10-CM

## 2024-02-20 DIAGNOSIS — I700 Atherosclerosis of aorta: Secondary | ICD-10-CM | POA: Diagnosis not present

## 2024-02-20 DIAGNOSIS — Z131 Encounter for screening for diabetes mellitus: Secondary | ICD-10-CM

## 2024-02-20 DIAGNOSIS — E785 Hyperlipidemia, unspecified: Secondary | ICD-10-CM | POA: Diagnosis not present

## 2024-02-20 DIAGNOSIS — R7303 Prediabetes: Secondary | ICD-10-CM

## 2024-02-20 MED ORDER — ROSUVASTATIN CALCIUM 5 MG PO TABS: 5.0000 mg | ORAL_TABLET | Freq: Every day | ORAL | 1 refills | Status: DC

## 2024-02-20 NOTE — Patient Instructions (Addendum)
 Let me know if you change your mind and want to trial cardiac monitoring for the palpiations  Trial rosuvastatin 5 mg daily until next visit  Schedule a lab visit at the check out desk in early August before your next appointment  on August 11th. Return for future fasting labs meaning nothing but water after midnight please. Ok to take your medications with water.   Recommended follow up: Return for next already scheduled visit or sooner if needed.

## 2024-02-20 NOTE — Progress Notes (Signed)
 Phone [phone] In person visit   Subjective:   Dana Chambers is a 78 y.o. year old very pleasant female patient who presents for/with See problem oriented charting Chief Complaint  Patient presents with   Medical Management of Chronic Issues   Hyperlipidemia    Past Medical History-  Patient Active Problem List   Diagnosis Date Noted   Raynaud's disease 01/03/2023    Priority: Medium    Hyperlipidemia- february 2020 coronary Calcium score of 0 12/12/2018    Priority: Medium    Vitamin D insufficiency 08/13/2018    Priority: Medium    Elevated glycosylated hemoglobin 08/13/2018    Priority: Medium    Rib pain on left side 03/21/2017    Priority: Medium    Scoliosis 03/21/2017    Priority: Medium    Osteoporosis 06/29/2009    Priority: Medium    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     Cholecalciferol (VITAMIN D PO) Take by mouth.     Cyanocobalamin (VITAMIN B 12 PO) Take by mouth.     denosumab (PROLIA) 60 MG/ML SOSY injection Inject 60 mg into the skin every 6 (six) months.     esomeprazole (NEXIUM) 20 MG capsule TAKE ONE CAPSULE BY MOUTH EVERY DAY AT NOON 90 capsule 3   famotidine (PEPCID) 10 MG tablet Take 1 tablet (10 mg total) by mouth 2 (two) times daily. 60 tablet 11   rosuvastatin (CRESTOR) 5 MG tablet Take 1 tablet (5 mg total) by mouth daily. 90 tablet 1   Current Facility-Administered Medications  Medication Dose Route Frequency Provider Last Rate Last Admin   [START ON 07/23/2024] denosumab (PROLIA) injection 60 mg  60  mg Subcutaneous Once Carlus Pavlov, MD         Objective:  BP 102/60   Pulse 75   Temp (!) 97 F (36.1 C)   Ht 5\' 6"  (1.676 m)   Wt 141 lb 6.4 oz (64.1 kg)   LMP  (LMP Unknown)   BMI 22.82 kg/m  Gen: NAD, resting comfortably CV: RRR no murmurs rubs or gallops Lungs: CTAB no crackles, wheeze, rhonchi Ext: no edema Skin: warm, dry  EKG: sinus rhythm  other than a single PVC with rate 72, normal axis, normal intervals, no hypertrophy, no st or t wave changes     Assessment and Plan   # Palpitations S: Patient admission to add visit to 725 mild palpitations-no irregularity just more of the presence of her heartbeat when she lays down and she did not use to notice this.  She also wondered if it could be reflux related because she felt a pressure in the upper abdomen/lower chest at times -slightly more common with glass of wine she has noted or caffeine  -On most recent MyChart visit she noted her heart beating strong and possibly erratically especially at night and desired EKG which was completed today -TSH was normal and CBC and CMP normal A/P: EKG largely reassuring other than single PVC.  We discussed  PVCs are typically benign but also offered cardiac monitoring to get a better feel for frequency and if her symptoms at night lined up with having PVCs-she declines for now and wants to monitor   #hyperlipidemia-patient with history of coronary calcium score of 0 on 01/16/2019 and 2025 exam # Aortic atherosclerosis on 2025 exam S: Medication: Rosuvastatin 10 mg weekly with substantial improvement with LDL down over 80 points from 191 to 105 Lab Results  Component Value Date   CHOL 203 (H) 12/27/2023   HDL 79.70 12/27/2023   LDLCALC 105 (H) 12/27/2023   LDLDIRECT 168.5 02/02/2012   TRIG 88.0 12/27/2023   CHOLHDL 3 12/27/2023  A/P: Hyperlipidemia/aortic atherosclerosis (presumed stable)- LDL goal ideally <70.  Even with coronary artery calcium score not being elevated patient  would like to be more aggressive and try to control cholesterol better-she is going to try to improve her diet but feels she is pretty close to maximization as far as what she is willing to do-for that reason she was to try higher dose rosuvastatin 5 mg daily.  We discussed prediabetes risk-we sent medication in and we can recheck that in August visit-she is actually on the schedule labs a few days beforehand  # Hyperglycemia/insulin resistance/prediabetes-peak A1c of 6.2 in the past-increased while on statin S:  Medication: none  Lab Results  Component Value Date   HGBA1C 6.2 12/27/2023   HGBA1C 6.0 06/22/2023   HGBA1C 6.0 01/03/2023  A/P: With increase in statin dose we will check A1c before next visit  Recommended follow up: Return for next already scheduled visit or sooner if needed. Future Appointments  Date Time Provider Department Center  03/11/2024  8:40 AM [facility] ANNUAL WELLNESS VISIT 1 [facility]  07/03/2024  8:30 AM [facility] LAB [facility]  07/08/2024 10:20 AM Shelva Majestic, MD [facility]  07/31/2024 10:00 AM [facility] NURSE [facility] None  09/30/2024 10:00 AM Carlus Pavlov, MD [facility] None  01/10/2025  8:20 AM Durene Cal Aldine Contes, MD [facility]    Lab/Order associations:   ICD-10-CM   1. Palpitations  R00.2 EKG 12-Lead    2. Screening for diabetes mellitus  Z13.1 Hemoglobin A1c    3. Hyperglycemia  R73.9 Hemoglobin A1c    4. Hyperlipidemia, unspecified hyperlipidemia type  E78.5 Comprehensive metabolic panel    Lipid panel    5. Prediabetes  R73.03     6. Aortic atherosclerosis (HCC)  I70.0       Meds ordered this encounter  Medications   rosuvastatin (CRESTOR) 5 MG tablet    Sig: Take 1 tablet (5 mg total) by mouth daily.    Dispense:  90 tablet    Refill:  1    Return precautions advised.  Tana Conch, MD

## 2024-02-23 ENCOUNTER — Encounter: Payer: Self-pay | Admitting: Family Medicine

## 2024-02-26 ENCOUNTER — Encounter: Payer: Self-pay | Admitting: Family Medicine

## 2024-02-26 DIAGNOSIS — I493 Ventricular premature depolarization: Secondary | ICD-10-CM | POA: Insufficient documentation

## 2024-03-11 ENCOUNTER — Ambulatory Visit (INDEPENDENT_AMBULATORY_CARE_PROVIDER_SITE_OTHER): Payer: Medicare PPO

## 2024-03-11 VITALS — BP 110/68 | HR 80 | Temp 98.2°F | Ht 66.0 in | Wt 142.2 lb

## 2024-03-11 DIAGNOSIS — Z0000 Encounter for general adult medical examination without abnormal findings: Secondary | ICD-10-CM | POA: Diagnosis not present

## 2024-03-11 NOTE — Patient Instructions (Signed)
 Dana Chambers , Thank you for taking time to come for your Medicare Wellness Visit. I appreciate your ongoing commitment to your health goals. Please review the following plan we discussed and let me know if I can assist you in the future.   Referrals/Orders/Follow-Ups/Clinician Recommendations: maintain health and activity   This is a list of the screening recommended for you and due dates:  Health Maintenance  Topic Date Due   Medicare Annual Wellness Visit  03/05/2024   Pneumonia Vaccine (2 of 2 - PCV) 01/04/2025*   COVID-19 Vaccine (9 - Pfizer risk 2024-25 season) 04/11/2024   Flu Shot  06/28/2024   Mammogram  10/08/2024   DEXA scan (bone density measurement)  Completed   Hepatitis C Screening  Completed   Zoster (Shingles) Vaccine  Completed   HPV Vaccine  Aged Out   Meningitis B Vaccine  Aged Out   DTaP/Tdap/Td vaccine  Discontinued   Colon Cancer Screening  Discontinued  *Topic was postponed. The date shown is not the original due date.    Advanced directives: (In Chart) A copy of your advanced directives are scanned into your chart should your provider ever need it.  Next Medicare Annual Wellness Visit scheduled for next year: Yes

## 2024-03-11 NOTE — Progress Notes (Addendum)
 Subjective:   Dana Chambers is a 78 y.o. who presents for a Medicare Wellness preventive visit.  Visit Complete: In person    Persons Participating in Visit: Patient.  AWV Questionnaire: Yes: Patient Medicare AWV questionnaire was completed by the patient on 03/09/24; I have confirmed that all information answered by patient is correct and no changes since this date.  Cardiac Risk Factors include: advanced age (>76men, >92 women);dyslipidemia     Objective:    Today's Vitals   03/11/24 0833  BP: 110/68  Pulse: 80  Temp: 98.2 F (36.8 C)  SpO2: 98%  Weight: 142 lb 3.2 oz (64.5 kg)  Height: 5\' 6"  (1.676 m)   Body mass index is 22.95 kg/m.     03/11/2024    8:47 AM 03/06/2023    8:32 AM 02/21/2022    8:47 AM 02/21/2021   10:01 PM 02/09/2021    1:31 PM 01/28/2021    8:18 AM 06/22/2018    1:35 PM  Advanced Directives  Does Patient Have a Medical Advance Directive? Yes Yes Yes Yes Yes Yes Yes  Type of Estate agent of [facility];Living will Healthcare Power of [place];Living will Healthcare Power of [employer] Power of [employer] Power of [employer] Power of Attorney   Does patient want to make changes to medical advance directive? No - Patient declined No - Patient declined       Copy of Healthcare Power of Attorney in Chart? Yes - validated most recent copy scanned in chart (See row information) Yes - validated most recent copy scanned in chart (See row information) Yes - validated most recent copy scanned in chart (See row information) Yes - validated most recent copy scanned in chart (See row information) Yes - validated most recent copy scanned in chart (See row information) Yes - validated most recent copy scanned in chart (See row information)     Current Medications (verified) Outpatient Encounter Medications as of 03/11/2024  Medication Sig   acetaminophen (TYLENOL) 500 MG tablet Take 1,000 mg by mouth 3 (three) times daily  as needed.   Cholecalciferol (VITAMIN D PO) Take by mouth.   Cyanocobalamin (VITAMIN B 12 PO) Take by mouth.   denosumab (PROLIA) 60 MG/ML SOSY injection Inject 60 mg into the skin every 6 (six) months.   esomeprazole (NEXIUM) 20 MG capsule TAKE ONE CAPSULE BY MOUTH EVERY DAY AT NOON   famotidine (PEPCID) 10 MG tablet Take 1 tablet (10 mg total) by mouth 2 (two) times daily.   FLUZONE HIGH-DOSE 0.5 ML injection    rosuvastatin (CRESTOR) 5 MG tablet Take 1 tablet (5 mg total) by mouth daily.   SPIKEVAX syringe    Facility-Administered Encounter Medications as of 03/11/2024  Medication   [START ON 07/23/2024] denosumab (PROLIA) injection 60 mg    Allergies (verified) Triamcinolone acetonide [triamcinolone] and Amoxicillin   History: Past Medical History:  Diagnosis Date   Basal cell carcinoma of skin    left ankle, right thigh   Bursitis    right hip   Cancer (HCC) 2006   Breast/right   Cataract    COVID 01/27/2021   Diverticulosis    GERD (gastroesophageal reflux disease)    History of breast cancer/33 radiation treatments/ no chemo    2006  S/P RIGHT LUMPECTOMY AND RADIATION FOLLOWED BY DR RUBIN--  NO RECURRENCE   History of kidney stones    Hyperplastic colon polyp    IBS (irritable bowel syndrome)    Idiopathic peripheral  neuropathy    bilateral tingling/ due to fall in [facility]   Internal hemorrhoid    Kidney stone    Leiomyoma    Multiple small   Liver cyst 2006   Benign   OA (osteoarthritis)    Osteoporosis 2019   T score -2.5   Personal history of radiation therapy    Raynaud disease    Renal calculus, left    non obstructive   Rib pain on left side    able to lay on left side   Rosacea    Tinnitus    Past Surgical History:  Procedure Laterality Date   BREAST BIOPSY Left 2016   benign   BREAST EXCISIONAL BIOPSY Right 1973   benign   BREAST LUMPECTOMY  2006   Right -- S/P RADIATION--  NO RECURRENCE   CATARACT EXTRACTION     DILATION AND CURETTAGE OF  UTERUS     EXTRACORPOREAL SHOCK WAVE LITHOTRIPSY     X2   HYSTEROSCOPY     HYSTEROSCOPY WITH D & C  08/03/2012   Procedure: DILATATION AND CURETTAGE /HYSTEROSCOPY;  Surgeon: Dara Lords, MD;  Location: [facility];  Service: Gynecology;  Laterality: N/A;   SUPRANUMERARY NIPPLE EXCISION  1973   CYST   TONSILLECTOMY  AGE 47   Family History  Problem Relation Age of Onset   Dementia Mother    Depression Mother    Osteoporosis Mother    Arthritis Mother    Stroke Father    Heart attack Father 77   Early death Father    Heart disease Father    Stroke Sister        TIA. ? dementia. another stroke after covid then feeding issues led to eath   Dementia Sister    Rectal cancer Maternal Grandmother    Colon cancer Maternal Grandmother    Cancer Maternal Grandmother    Breast cancer Neg Hx    BRCA 1/2 Neg Hx    Social History   Socioeconomic History   Marital status: Married    Spouse name: Not on file   Number of children: 1   Years of education: Not on file   Highest education level: Bachelor's degree (e.g., BA, AB, BS)  Occupational History   Occupation: Retired-Teacher  Tobacco Use   Smoking status: Never   Smokeless tobacco: Never  Vaping Use   Vaping status: Never Used  Substance and Sexual Activity   Alcohol use: Yes    Alcohol/week: 3.0 standard drinks of alcohol    Types: 3 Glasses of wine per week    Comment: Wine with dinner   Drug use: Never   Sexual activity: Not Currently    Birth control/protection: Post-menopausal    Comment: Limited sexual activity  Other Topics Concern   Not on file  Social History Narrative   Married (husband patient of Dr. Durene Cal). 1 daughter. 1 granddaughter.       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       Hobbies: reading, travel, time with granddaughter. Very active   Social Drivers of Corporate investment banker Strain: Low Risk  (03/11/2024)   Overall Financial Resource Strain (CARDIA)    Difficulty  of Paying Living Expenses: Not hard at all  Food Insecurity: No Food Insecurity (03/11/2024)   Hunger Vital Sign    Worried About Running Out of Food in the Last Year: Never true    Ran Out of Food in the Last Year: Never true  Transportation Needs: No Transportation Needs (03/11/2024)   PRAPARE - Administrator, Civil Service (Medical): No    Lack of Transportation (Non-Medical): No  Physical Activity: Insufficiently Active (03/11/2024)   Exercise Vital Sign    Days of Exercise per Week: 2 days    Minutes of Exercise per Session: 40 min  Stress: No Stress Concern Present (03/11/2024)   [employer] of Occupational Health - Occupational Stress Questionnaire    Feeling of Stress : Only a little  Social Connections: Socially Integrated (03/11/2024)   Social Connection and Isolation Panel [NHANES]    Frequency of Communication with Friends and Family: Three times a week    Frequency of Social Gatherings with Friends and Family: More than three times a week    Attends Religious Services: More than 4 times per year    Active Member of [employer] or Organizations: Yes    Attends Banker Meetings: 1 to 4 times per year    Marital Status: Married    Tobacco Counseling Counseling given: Not Answered    Clinical Intake:  Pre-visit preparation completed: Yes  Pain : No/denies pain     BMI - recorded: 22.95 Nutritional Status: BMI of 19-24  Normal Diabetes: No  Lab Results  Component Value Date   HGBA1C 6.2 12/27/2023   HGBA1C 6.0 06/22/2023   HGBA1C 6.0 01/03/2023     How often do you need to have someone help you when you read instructions, pamphlets, or other written materials from your doctor or pharmacy?: 1 - Never  Interpreter Needed?: No  Information entered by :: Lanier Ensign, LPN   Activities of Daily Living     03/09/2024    3:29 PM  In your present state of health, do you have any difficulty performing the following activities:   Hearing? 0  Vision? 1  Comment left eye laser from scaring  Difficulty concentrating or making decisions? 0  Walking or climbing stairs? 0  Dressing or bathing? 0  Doing errands, shopping? 0  Preparing Food and eating ? N  Using the Toilet? N  In the past six months, have you accidently leaked urine? Y  Comment at times and wears a pad  Do you have problems with loss of bowel control? N  Managing your Medications? N  Managing your Finances? N  Housekeeping or managing your Housekeeping? N    Patient Care Team: Shelva Majestic, MD as PCP - General (Family Medicine) Venancio Poisson, MD as Consulting Physician (Dermatology) Carlus Pavlov, MD as Consulting Physician (Endocrinology) Napoleon Form, MD as Consulting Physician (Gastroenterology) Mateo Flow, MD as Consulting Physician (Ophthalmology)  Indicate any recent Medical Services you may have received from other than [facility] providers in the past year (date may be approximate).     Assessment:   This is a routine wellness examination for [facility].  Hearing/Vision screen Hearing Screening - Comments:: Pt denies any hearing issues  Vision Screening - Comments:: Wears rx glasses - up to date with routine eye exams with  Dr Elmer Picker and Dr Bascom Levels    Goals Addressed             This Visit's Progress    Patient Stated       Maintain health and activity        Depression Screen     03/11/2024    8:43 AM 07/03/2023    8:57 AM 03/06/2023    8:30 AM 01/03/2023    9:49 AM 02/21/2022  8:45 AM 12/27/2021    8:37 AM 02/26/2021    9:18 AM  PHQ 2/9 Scores  PHQ - 2 Score 0 0 0 0 0 0 0  PHQ- 9 Score  0  0  1     Fall Risk     03/09/2024    3:29 PM 07/03/2023    9:01 AM 07/03/2023    8:56 AM 03/02/2023   11:11 AM 01/03/2023    9:49 AM  Fall Risk   Falls in the past year? 0 0 0 0 0  Number falls in past yr: 0 0 0 0 0  Injury with Fall? 0 0 0 0 0  Risk for fall due to : Impaired vision;Impaired balance/gait No Fall Risks No  Fall Risks Impaired vision;Impaired balance/gait No Fall Risks  Follow up Falls prevention discussed Falls evaluation completed Falls evaluation completed Falls prevention discussed Falls evaluation completed    MEDICARE RISK AT HOME:  Medicare Risk at Home Any stairs in or around the home?: (Patient-Rptd) Yes If so, are there any without handrails?: (Patient-Rptd) Yes Home free of loose throw rugs in walkways, pet beds, electrical cords, etc?: (Patient-Rptd) Yes Adequate lighting in your home to reduce risk of falls?: (Patient-Rptd) Yes Life alert?: (Patient-Rptd) Yes Use of a cane, walker or w/c?: (Patient-Rptd) No Grab bars in the bathroom?: (Patient-Rptd) No Shower chair or bench in shower?: (Patient-Rptd) No Elevated toilet seat or a handicapped toilet?: (Patient-Rptd) No  TIMED UP AND GO:  Was the test performed?  Yes  Length of time to ambulate 10 feet: 10 sec Gait steady and fast without use of assistive device  Cognitive Function: 6CIT completed    06/22/2018    1:53 PM 05/20/2016    9:07 AM  MMSE - Mini Mental State Exam  Not completed: -- --        03/11/2024    8:49 AM 03/13/2023    8:29 AM 02/21/2022    8:50 AM 01/28/2021    8:23 AM  6CIT Screen  What Year? 0 points 0 points 0 points 0 points  What month? 0 points 0 points 0 points 0 points  What time? 0 points 0 points 0 points   Count back from 20 0 points 0 points 0 points 0 points  Months in reverse 0 points 0 points 0 points 0 points  Repeat phrase 0 points 0 points 0 points 0 points  Total Score 0 points 0 points 0 points     Immunizations Immunization History  Administered Date(s) Administered   Fluad Quad(high Dose 65+) 07/29/2020, 08/12/2022   Influenza Whole 10/29/2009, 08/11/2010, 08/19/2011   Influenza, High Dose Seasonal PF 09/29/2016, 08/21/2017, 08/01/2018, 07/15/2019, 07/29/2020, 07/21/2021, 08/19/2022, 10/21/2023   Influenza,inj,Quad PF,6+ Mos 08/21/2013, 09/02/2014   Influenza-Unspecified  09/09/2015, 08/01/2018, 07/17/2019   Moderna Covid-19 Fall Seasonal Vaccine 99yrs & older 10/13/2023   PFIZER Comirnaty(Gray Top)Covid-19 Tri-Sucrose Vaccine 04/08/2021, 08/19/2022   PFIZER(Purple Top)SARS-COV-2 Vaccination 12/23/2019, 01/13/2020, 07/31/2020   Pfizer Covid-19 Vaccine Bivalent Booster 75yrs & up 08/11/2021, 04/06/2022   Pneumococcal Conjugate-13 09/02/2014   Pneumococcal Polysaccharide-23 03/06/2012   Respiratory Syncytial Virus Vaccine,Recomb Aduvanted(Arexvy) 08/19/2022   Tdap 03/06/2012   Zoster Recombinant(Shingrix) 03/10/2018, 05/10/2018    Screening Tests Health Maintenance  Topic Date Due   Pneumonia Vaccine 41+ Years old (2 of 2 - PCV) 01/04/2025 (Originally 09/02/2014)   COVID-19 Vaccine (9 - Pfizer risk 2024-25 season) 04/11/2024   INFLUENZA VACCINE  06/28/2024   MAMMOGRAM  10/08/2024   Medicare Annual Wellness (AWV)  03/11/2025   DEXA SCAN  Completed   Hepatitis C Screening  Completed   Zoster Vaccines- Shingrix  Completed   HPV VACCINES  Aged Out   Meningococcal B Vaccine  Aged Out   DTaP/Tdap/Td  Discontinued   Colonoscopy  Discontinued    Health Maintenance  There are no preventive care reminders to display for this patient.  Health Maintenance Items Addressed: See Nurse Notes  Additional Screening:  Vision Screening: Recommended annual ophthalmology exams for early detection of glaucoma and other disorders of the eye.  Dental Screening: Recommended annual dental exams for proper oral hygiene  Community Resource Referral / Chronic Care Management: CRR required this visit?  No   CCM required this visit?  No     Plan:     I have personally reviewed and noted the following in the patient's chart:   Medical and social history Use of alcohol, tobacco or illicit drugs  Current medications and supplements including opioid prescriptions. Patient is not currently taking opioid prescriptions. Functional ability and status Nutritional  status Physical activity Advanced directives List of other physicians Hospitalizations, surgeries, and ER visits in previous 12 months Vitals Screenings to include cognitive, depression, and falls Referrals and appointments  In addition, I have reviewed and discussed with patient certain preventive protocols, quality metrics, and best practice recommendations. A written personalized care plan for preventive services as well as general preventive health recommendations were provided to patient.     Bruno Capri, LPN   1/61/0960   After Visit Summary: (In Person-Declined) Patient declined AVS at this time.  Notes: Please refer to Routing Comments.

## 2024-04-03 DIAGNOSIS — H0102A Squamous blepharitis right eye, upper and lower eyelids: Secondary | ICD-10-CM | POA: Diagnosis not present

## 2024-04-03 DIAGNOSIS — H40013 Open angle with borderline findings, low risk, bilateral: Secondary | ICD-10-CM | POA: Diagnosis not present

## 2024-04-03 DIAGNOSIS — H04123 Dry eye syndrome of bilateral lacrimal glands: Secondary | ICD-10-CM | POA: Diagnosis not present

## 2024-04-03 DIAGNOSIS — H0102B Squamous blepharitis left eye, upper and lower eyelids: Secondary | ICD-10-CM | POA: Diagnosis not present

## 2024-05-10 ENCOUNTER — Other Ambulatory Visit: Payer: Self-pay

## 2024-05-10 ENCOUNTER — Encounter: Payer: Self-pay | Admitting: Family Medicine

## 2024-05-10 DIAGNOSIS — R002 Palpitations: Secondary | ICD-10-CM

## 2024-05-13 ENCOUNTER — Ambulatory Visit: Admitting: Physician Assistant

## 2024-05-13 ENCOUNTER — Encounter: Payer: Self-pay | Admitting: Physician Assistant

## 2024-05-13 VITALS — BP 136/80 | HR 74 | Temp 98.4°F | Ht 66.0 in | Wt 140.5 lb

## 2024-05-13 DIAGNOSIS — R1012 Left upper quadrant pain: Secondary | ICD-10-CM | POA: Diagnosis not present

## 2024-05-13 NOTE — Progress Notes (Signed)
 Dana Chambers is a 78 y.o. female here for a new problem.  History of Present Illness:   Chief Complaint  Patient presents with   Abdominal Pain    Pt c/o left upper quadrant abdominal pain off and on since early May.   Constipation    Pt is having trouble with constipation off and on since May and bloating. Started Miralax 6/13, had bowel movement 6/14, 6/16.    HPI  Abdominal pain / Constipation Pt complains of intermittent LUQ abdominal pain starting early May.  Pt also complains of chronic history of associated constipation and bloating. She reports the pain has been increasing since May and started around the time she increased her Rosuvastatin  regimen. Per pt, after her last visit with her primary care provider in March of this year, she was supposed to start taking Rosuvastatin  daily after originally taking it weekly. Instead, pt took Rosuvastatin  every other day before stopping completely due to it causing a rise in her A1c and her own research. She last took Rosuvastatin  about a week ago.  There was also an episode of sharp abdominal pain that lasted seconds during a buildup of stool. Pt states she has complained about this pain in the past and this episode is the worst it has been. She started Miralax daily for the first time on 6/13 with subsequent bowel movements on 6/14, 6/16, and this morning. She has also tried Metamucil with intolerances of bloating and gassiness. Reports she felt pain when sitting down for bowel movements and sensitivity afterwards since starting Miralax. Reports disruptions in sleep and less PVCs since decreasing alcohol. Upon palpation of her abdomen, she confirms pain underneath her left rib and the pain worsens when standing.  Notes Fhx of colon cancer in her maternal grandmother, perforation and colostomy of a first cousin, and perforation of colon in another first cousin who passed due to lack of medical attention. Notes previous encounter with  gastroenterology and dx of diverticulosis after her colonoscopy in 2023. Endorses Nexium  and Pepcid  for heartburn. Endorses gluten sensitivity, drinking Diet sprite, [place], switching to decaffeinated coffee in the mornings, eating healthier, decreasing alcohol intake, and water walking twice a week. Denies hematochezia, hx of diverticulitis, current urinary sx, side effects since increasing Rosuvastatin , or sufficient water intake.  Denies unintentional weight loss.     Past Medical History:  Diagnosis Date   Basal cell carcinoma of skin    left ankle, right thigh   Bursitis    right hip   Cancer (HCC) 2006   Breast/right   Cataract    COVID 01/27/2021   Diverticulosis    GERD (gastroesophageal reflux disease)    History of breast cancer/33 radiation treatments/ no chemo    2006  S/P RIGHT LUMPECTOMY AND RADIATION FOLLOWED BY DR RUBIN--  NO RECURRENCE   History of kidney stones    Hyperplastic colon polyp    IBS (irritable bowel syndrome)    Idiopathic peripheral neuropathy    bilateral tingling/ due to fall in [facility]   Internal hemorrhoid    Kidney stone    Leiomyoma    Multiple small   Liver cyst 2006   Benign   OA (osteoarthritis)    Osteoporosis 2019   T score -2.5   Personal history of radiation therapy    Raynaud disease    Renal calculus, left    non obstructive   Rib pain on left side    able to lay on left side   Rosacea  Tinnitus      Social History   Tobacco Use   Smoking status: Never   Smokeless tobacco: Never  Vaping Use   Vaping status: Never Used  Substance Use Topics   Alcohol use: Yes    Alcohol/week: 3.0 standard drinks of alcohol    Types: 3 Glasses of wine per week    Comment: Wine with dinner   Drug use: Never    Past Surgical History:  Procedure Laterality Date   BREAST BIOPSY Left 2016   benign   BREAST EXCISIONAL BIOPSY Right 1973   benign   BREAST LUMPECTOMY  2006   Right -- S/P RADIATION--  NO RECURRENCE   CATARACT  EXTRACTION     DILATION AND CURETTAGE OF UTERUS     EXTRACORPOREAL SHOCK WAVE LITHOTRIPSY     X2   HYSTEROSCOPY     HYSTEROSCOPY WITH D & C  08/03/2012   Procedure: DILATATION AND CURETTAGE /HYSTEROSCOPY;  Surgeon: Lacretia Piccolo, MD;  Location: [facility];  Service: Gynecology;  Laterality: N/A;   SUPRANUMERARY NIPPLE EXCISION  1973   CYST   TONSILLECTOMY  AGE 65    Family History  Problem Relation Age of Onset   Dementia Mother    Depression Mother    Osteoporosis Mother    Arthritis Mother    Stroke Father    Heart attack Father 85   Early death Father    Heart disease Father    Stroke Sister        TIA. ? dementia. another stroke after covid then feeding issues led to eath   Dementia Sister    Rectal cancer Maternal Grandmother    Colon cancer Maternal Grandmother    Cancer Maternal Grandmother    Breast cancer Neg Hx    BRCA 1/2 Neg Hx     Allergies  Allergen Reactions   Triamcinolone  Acetonide [Triamcinolone ] Other (See Comments)    Red cheeks and nose, elevated heart rate   Amoxicillin Diarrhea    GI upset    Current Medications:   Current Outpatient Medications:    acetaminophen  (TYLENOL ) 500 MG tablet, Take 1,000 mg by mouth 3 (three) times daily as needed., Disp: , Rfl:    Cholecalciferol (VITAMIN D  PO), Take by mouth., Disp: , Rfl:    Cyanocobalamin  (VITAMIN B 12 PO), Take by mouth., Disp: , Rfl:    denosumab  (PROLIA ) 60 MG/ML SOSY injection, Inject 60 mg into the skin every 6 (six) months., Disp: , Rfl:    esomeprazole  (NEXIUM ) 20 MG capsule, TAKE ONE CAPSULE BY MOUTH EVERY DAY AT NOON, Disp: 90 capsule, Rfl: 3   famotidine  (PEPCID ) 10 MG tablet, Take 1 tablet (10 mg total) by mouth 2 (two) times daily. (Patient taking differently: Take 10 mg by mouth as needed.), Disp: 60 tablet, Rfl: 11   rosuvastatin  (CRESTOR ) 5 MG tablet, Take 1 tablet (5 mg total) by mouth daily. (Patient not taking: Reported on 05/13/2024), Disp: 90 tablet, Rfl:  1  Current Facility-Administered Medications:    [START ON 07/23/2024] denosumab  (PROLIA ) injection 60 mg, 60 mg, Subcutaneous, Once, Gherghe, Cristina, MD   Review of Systems:   Negative unless otherwise specified per HPI.  Vitals:   Vitals:   05/13/24 1006  BP: 136/80  Pulse: 74  Temp: 98.4 F (36.9 C)  TempSrc: Temporal  SpO2: 99%  Weight: 140 lb 8 oz (63.7 kg)  Height: 5' 6 (1.676 m)     Body mass index is 22.68 kg/m.  Physical Exam:   Physical Exam Vitals and nursing note reviewed.  Constitutional:      General: She is not in acute distress.    Appearance: She is well-developed. She is not ill-appearing or toxic-appearing.   Cardiovascular:     Rate and Rhythm: Normal rate and regular rhythm.     Pulses: Normal pulses.     Heart sounds: Normal heart sounds, S1 normal and S2 normal.  Pulmonary:     Effort: Pulmonary effort is normal.     Breath sounds: Normal breath sounds.  Abdominal:     General: Abdomen is flat. Bowel sounds are normal.     Palpations: Abdomen is soft.     Tenderness: There is no abdominal tenderness. There is no guarding or rebound. Negative signs include Murphy's sign and Rovsing's sign.   Skin:    General: Skin is warm and dry.   Neurological:     Mental Status: She is alert.     GCS: GCS eye subscore is 4. GCS verbal subscore is 5. GCS motor subscore is 6.   Psychiatric:        Speech: Speech normal.        Behavior: Behavior normal. Behavior is cooperative.     Assessment and Plan:   1. LUQ pain (Primary) - CBC with Differential/Platelet - Comprehensive metabolic panel with GFR - Urinalysis, Routine w reflex microscopic  No red flags on exam No tenderness to abdomen or concern for acute abdomen at this time I advised patient to increase fluids, she does not think that she can get to 64 ounces of water a day initial goal was 32 ounces of water per day I recommended that she is continue 1 capful of MiraLAX daily for the  rest of this week Advised as follows: Message me Wed afternoon or sometime on Thursday with an update -- if still no change, we will get imaging, consider CT of abdomen versus ultrasound of abdomen If any WORSENING or NEW symptom(s) in the meantime, let me know and we will get imaging  Will defer to PCP on management of cholesterol and statin use  I, Timoteo Force, acting as a Neurosurgeon for [employer], [place]., have documented all relevant documentation on the behalf of Alexander Iba, [place], as directed by  Alexander Iba, PA while in the presence of Alexander Iba, [place].  I, Alexander Iba, [place], have reviewed all documentation for this visit. The documentation on 05/13/24 for the exam, diagnosis, procedures, and orders are all accurate and complete.  I spent a total of 28 minutes on this visit, today 05/13/24, which included reviewing previous notes from primary care provider and gastroenterology, ordering tests, discussing plan of care with patient and using shared-decision making on next steps,  and documenting the findings in the note.   Alexander Iba, PA-C

## 2024-05-13 NOTE — Patient Instructions (Signed)
 It was great to see you!  Please work on our initial goal of 32 oz water daily  Review constipation handout for more proactive ideas  Continue capful of miralax DAILY through Thursday  Message me Wed afternoon or sometime on Thursday with an update -- if still no change, we will get imaging If any WORSENING or NEW symptom(s) in the meantime, let me know and we will get imaging  Take care,  Alexander Iba PA-C

## 2024-05-14 ENCOUNTER — Ambulatory Visit: Payer: Self-pay | Admitting: Physician Assistant

## 2024-05-14 DIAGNOSIS — E875 Hyperkalemia: Secondary | ICD-10-CM

## 2024-05-14 LAB — COMPREHENSIVE METABOLIC PANEL WITH GFR
AG Ratio: 2 (calc) (ref 1.0–2.5)
ALT: 10 U/L (ref 6–29)
AST: 17 U/L (ref 10–35)
Albumin: 4.7 g/dL (ref 3.6–5.1)
Alkaline phosphatase (APISO): 72 U/L (ref 37–153)
BUN: 21 mg/dL (ref 7–25)
CO2: 25 mmol/L (ref 20–32)
Calcium: 9.8 mg/dL (ref 8.6–10.4)
Chloride: 102 mmol/L (ref 98–110)
Creat: 0.82 mg/dL (ref 0.60–1.00)
Globulin: 2.4 g/dL (ref 1.9–3.7)
Glucose, Bld: 94 mg/dL (ref 65–99)
Potassium: 5.5 mmol/L — ABNORMAL HIGH (ref 3.5–5.3)
Sodium: 140 mmol/L (ref 135–146)
Total Bilirubin: 0.4 mg/dL (ref 0.2–1.2)
Total Protein: 7.1 g/dL (ref 6.1–8.1)
eGFR: 73 mL/min/{1.73_m2} (ref >=60)

## 2024-05-14 LAB — CBC WITH DIFFERENTIAL/PLATELET
Absolute Lymphocytes: 2646 {cells}/uL (ref 850–3900)
Absolute Monocytes: 581 {cells}/uL (ref 200–950)
Basophils Absolute: 28 {cells}/uL (ref 0–200)
Basophils Relative: 0.4 %
Eosinophils Absolute: 77 {cells}/uL (ref 15–500)
Eosinophils Relative: 1.1 %
HCT: 42.5 % (ref 35.0–45.0)
Hemoglobin: 13.6 g/dL (ref 11.7–15.5)
MCH: 30.2 pg (ref 27.0–33.0)
MCHC: 32 g/dL (ref 32.0–36.0)
MCV: 94.2 fL (ref 80.0–100.0)
MPV: 11.1 fL (ref 7.5–12.5)
Monocytes Relative: 8.3 %
Neutro Abs: 3668 {cells}/uL (ref 1500–7800)
Neutrophils Relative %: 52.4 %
Platelets: 306 10*3/uL (ref 140–400)
RBC: 4.51 10*6/uL (ref 3.80–5.10)
RDW: 13.3 % (ref 11.0–15.0)
Total Lymphocyte: 37.8 %
WBC: 7 10*3/uL (ref 3.8–10.8)

## 2024-05-14 LAB — URINALYSIS, ROUTINE W REFLEX MICROSCOPIC
Bacteria, UA: NONE SEEN /HPF
Bilirubin Urine: NEGATIVE
Glucose, UA: NEGATIVE
Hgb urine dipstick: NEGATIVE
Hyaline Cast: NONE SEEN /LPF
Ketones, ur: NEGATIVE
Nitrite: NEGATIVE
Protein, ur: NEGATIVE
RBC / HPF: NONE SEEN /HPF (ref 0–2)
Specific Gravity, Urine: 1.006 (ref 1.001–1.035)
Squamous Epithelial / HPF: NONE SEEN /HPF (ref <=5)
WBC, UA: NONE SEEN /HPF (ref 0–5)
pH: 7 (ref 5.0–8.0)

## 2024-05-14 LAB — MICROSCOPIC MESSAGE

## 2024-05-15 ENCOUNTER — Other Ambulatory Visit: Payer: Self-pay | Admitting: Physician Assistant

## 2024-05-15 DIAGNOSIS — R1012 Left upper quadrant pain: Secondary | ICD-10-CM

## 2024-05-15 NOTE — Telephone Encounter (Signed)
 Spoke to pt told her Dana Chambers has ordered a STAT CT Abdomen/Pelvis, Edwina Gram our referral coordinator will be contacting you with your appointment information. Pt verbalized understanding.

## 2024-05-16 ENCOUNTER — Ambulatory Visit (HOSPITAL_BASED_OUTPATIENT_CLINIC_OR_DEPARTMENT_OTHER)
Admission: RE | Admit: 2024-05-16 | Discharge: 2024-05-16 | Disposition: A | Discharge status: Home / Self Care | Source: Ambulatory Visit | Attending: Physician Assistant | Admitting: Physician Assistant

## 2024-05-16 ENCOUNTER — Ambulatory Visit: Payer: Self-pay | Admitting: Physician Assistant

## 2024-05-16 DIAGNOSIS — R1012 Left upper quadrant pain: Secondary | ICD-10-CM | POA: Diagnosis not present

## 2024-05-16 DIAGNOSIS — N200 Calculus of kidney: Secondary | ICD-10-CM | POA: Diagnosis not present

## 2024-05-16 DIAGNOSIS — D259 Leiomyoma of uterus, unspecified: Secondary | ICD-10-CM | POA: Diagnosis not present

## 2024-05-16 DIAGNOSIS — R109 Unspecified abdominal pain: Secondary | ICD-10-CM | POA: Diagnosis not present

## 2024-05-16 MED ORDER — IOHEXOL 350 MG/ML SOLN
100.0000 mL | Freq: Once | INTRAVENOUS | Status: AC | PRN
  Administered 2024-05-16: 100 mL via INTRAVENOUS

## 2024-06-25 NOTE — Telephone Encounter (Signed)
 Prolia  VOB initiated via [email or web address]  Next Prolia  inj DUE: 07/24/24

## 2024-06-29 NOTE — Telephone Encounter (Signed)
 [employer] - Prior Authorization REQUIRED for [employer]

## 2024-07-01 ENCOUNTER — Ambulatory Visit: Payer: Self-pay | Admitting: Family Medicine

## 2024-07-01 ENCOUNTER — Other Ambulatory Visit (INDEPENDENT_AMBULATORY_CARE_PROVIDER_SITE_OTHER)

## 2024-07-01 DIAGNOSIS — E875 Hyperkalemia: Secondary | ICD-10-CM | POA: Diagnosis not present

## 2024-07-01 DIAGNOSIS — E785 Hyperlipidemia, unspecified: Secondary | ICD-10-CM | POA: Diagnosis not present

## 2024-07-01 DIAGNOSIS — R739 Hyperglycemia, unspecified: Secondary | ICD-10-CM | POA: Diagnosis not present

## 2024-07-01 DIAGNOSIS — Z131 Encounter for screening for diabetes mellitus: Secondary | ICD-10-CM

## 2024-07-01 LAB — COMPREHENSIVE METABOLIC PANEL WITH GFR
ALT: 10 U/L (ref 0–35)
AST: 16 U/L (ref 0–37)
Albumin: 4.5 g/dL (ref 3.5–5.2)
Alkaline Phosphatase: 55 U/L (ref 39–117)
BUN: 22 mg/dL (ref 6–23)
CO2: 31 meq/L (ref 19–32)
Calcium: 9.4 mg/dL (ref 8.4–10.5)
Chloride: 102 meq/L (ref 96–112)
Creatinine, Ser: 0.91 mg/dL (ref 0.40–1.20)
GFR: 59.89 mL/min — ABNORMAL LOW (ref >60.00)
Glucose, Bld: 98 mg/dL (ref 70–99)
Potassium: 4.4 meq/L (ref 3.5–5.1)
Sodium: 140 meq/L (ref 135–145)
Total Bilirubin: 0.5 mg/dL (ref 0.2–1.2)
Total Protein: 7.1 g/dL (ref 6.0–8.3)

## 2024-07-01 LAB — LIPID PANEL
Cholesterol: 269 mg/dL — ABNORMAL HIGH (ref 0–200)
HDL: 68.7 mg/dL (ref >39.00)
LDL Cholesterol: 180 mg/dL — ABNORMAL HIGH (ref 0–99)
NonHDL: 200.2
Total CHOL/HDL Ratio: 4
Triglycerides: 100 mg/dL (ref 0.0–149.0)
VLDL: 20 mg/dL (ref 0.0–40.0)

## 2024-07-01 LAB — BASIC METABOLIC PANEL WITH GFR
BUN: 22 mg/dL (ref 7–25)
CO2: 31 mmol/L (ref 20–32)
Calcium: 9.7 mg/dL (ref 8.6–10.4)
Chloride: 102 mmol/L (ref 98–110)
Creat: 0.9 mg/dL (ref 0.60–1.00)
Glucose, Bld: 94 mg/dL (ref 65–99)
Potassium: 4.7 mmol/L (ref 3.5–5.3)
Sodium: 141 mmol/L (ref 135–146)
eGFR: 65 mL/min/1.73m2 (ref >=60)

## 2024-07-01 LAB — HEMOGLOBIN A1C: Hgb A1c MFr Bld: 6.3 % (ref 4.6–6.5)

## 2024-07-02 ENCOUNTER — Ambulatory Visit: Payer: Self-pay | Admitting: Physician Assistant

## 2024-07-02 NOTE — Telephone Encounter (Signed)
 Prior Authorization initiated for PROLIA  via [email or web address] KEY: [id number]

## 2024-07-03 ENCOUNTER — Other Ambulatory Visit

## 2024-07-04 NOTE — Telephone Encounter (Signed)
 [employer]  Prior Authorization for PROLIA  APPROVED PA# [id number] Valid: 05/15/20-11/27/24

## 2024-07-05 ENCOUNTER — Encounter: Payer: Self-pay | Admitting: Internal Medicine

## 2024-07-08 ENCOUNTER — Ambulatory Visit: Payer: Medicare PPO | Admitting: Family Medicine

## 2024-07-08 ENCOUNTER — Encounter: Payer: Self-pay | Admitting: Family Medicine

## 2024-07-08 VITALS — BP 110/70 | Temp 97.6°F | Ht 66.0 in | Wt 137.6 lb

## 2024-07-08 DIAGNOSIS — R7303 Prediabetes: Secondary | ICD-10-CM | POA: Diagnosis not present

## 2024-07-08 DIAGNOSIS — K219 Gastro-esophageal reflux disease without esophagitis: Secondary | ICD-10-CM

## 2024-07-08 DIAGNOSIS — E785 Hyperlipidemia, unspecified: Secondary | ICD-10-CM

## 2024-07-08 DIAGNOSIS — I493 Ventricular premature depolarization: Secondary | ICD-10-CM

## 2024-07-08 NOTE — Patient Instructions (Addendum)
 No changes today other than staying off statin until we get Dr. Godfrey impression  Restart your exercise  Recommended follow up: Return for next already scheduled visit or sooner if needed.

## 2024-07-08 NOTE — Progress Notes (Signed)
 Phone [phone] In person visit   Subjective:   Dana Chambers is a 78 y.o. year old very pleasant female patient who presents for/with See problem oriented charting Chief Complaint  Patient presents with   Hyperlipidemia   Gastroesophageal Reflux   Medical Management of Chronic Issues    Would like to speak about the crestor ; and her last CT scan;     Past Medical History-  Patient Active Problem List   Diagnosis Date Noted   PVC (premature ventricular contraction) 02/26/2024    Priority: Medium    Raynaud's disease 01/03/2023    Priority: Medium    Hyperlipidemia- february 2020 coronary Calcium  score of 0 12/12/2018    Priority: Medium    Vitamin D  insufficiency 08/13/2018    Priority: Medium    Elevated glycosylated hemoglobin 08/13/2018    Priority: Medium    Rib pain on left side 03/21/2017    Priority: Medium    Scoliosis 03/21/2017    Priority: Medium    Osteoporosis 06/29/2009    Priority: Medium    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     Cholecalciferol (VITAMIN D  PO) Take by mouth.     Cyanocobalamin  (VITAMIN B 12 PO) Take by mouth.     denosumab  (PROLIA ) 60 MG/ML SOSY injection Inject 60 mg into the skin every 6 (six) months.     esomeprazole  (NEXIUM ) 20 MG capsule TAKE ONE CAPSULE BY MOUTH EVERY DAY AT NOON 90 capsule 3   famotidine  (PEPCID ) 10 MG tablet Take 10 mg by mouth 2 (two) times daily as needed for heartburn or indigestion.     Current Facility-Administered Medications  Medication Dose Route Frequency  Provider Last Rate Last Admin   [START ON 07/23/2024] denosumab  (PROLIA ) injection 60 mg  60 mg Subcutaneous Once Gherghe, Cristina, MD         Objective:  BP 110/70 (BP Location: Left Arm, Patient Position: Sitting, Cuff Size: Normal)   Temp 97.6 F (36.4 C) (Temporal)   Ht 5' 6 (1.676 m)   Wt 137 lb 9.6 oz (62.4 kg)   LMP  (LMP Unknown)   BMI 22.21 kg/m  Gen: NAD, resting comfortably CV: RRR no murmurs rubs or gallops Lungs: CTAB no crackles, wheeze, rhonchi Ext: minimal edema Skin: warm, dry    Assessment and Plan   #hyperlipidemia-patient with history of coronary calcium  score of 0 on 01/16/2019 and 2025 exam # Aortic atherosclerosis on 2025 exam S: Medication: Rosuvastatin  10 mg week in past- when went to daily had more abdominal pain including left upper quadrant of abdomen and constipation.  with family history patient was opted to take rosuvastatin  10 mg once a week in the past with incredible improvement of LDL from over 200-113. She came off statin due to increased a1c but we later restarted on daily before abdominal symptoms above A/P: lipids are up off of statin but shed like to consider alternate or go back to weekly rosuvastatin - wants to discuss with Dr. Ladona first- I'm  happy to send in prescription when they decide. We could also try alternate like atorvastatin for instance.   #PVC's/palpitations- has reduced coffeine and alcoho although minimal at baseline and symptoms are about the same. Worse when laying down. TSh normal in January. March 2025 EKG noted PVC -upcoming visit with Dr. Ladona  # GERD S:Medication: Nexium  20 mg-has required 40 mg in the past. Sparing Pepcid  as needed -With osteoporosis history prefer lowest dose possible A/P: reasonable control- continue current medications    # Hyperglycemia/insulin resistance/prediabetes-peak A1c of 6.2 in the past-increased while on statin S:  Medication: none Exercise and diet- eating well and down 4 lbs but  needs to restart exercise Lab Results  Component Value Date   HGBA1C 6.3 07/01/2024   HGBA1C 6.2 12/27/2023   HGBA1C 6.0 06/22/2023  A/P: a1c up very slightly but had increased statin and has been off exercise- plans to restart exercise and we are considering restarting statin  # Osteoporosis-follows with Dr. Trixie S: Last DEXA: 10/24/22 with slight worsening in left femur neck  Medication: Prolia  through Dr. Trixie  Calcium : 1200mg  (through diet ok) recommended - has stopped- she will run by Dr. Trixie- ws concerned about stones Vitamin D : 1000 units a day recommended-vitamin D  still taking A/P: osteoporosis hopefully stabilizing- continue current medications and follow up with Dr. Trixie- she's going of the ask about calcium  next visit though I encouraged her to get this through diet-goal 1200 mg a day even with kidney stone history.  I also encouraged her to restart her exercise which is important for the bone strength   # prior abdominal pain and constipation better off of statin-incidental finding discussed below... #abnormal vein imaging 05/16/24 CT Lobular soft tissue density in the left lower quadrant region of the pelvis which appears to be in continuity with the left external iliac vein measures 3 cm in maximum AP diameter by 2.1 cm transverse diameter by 2.5 cm in longitudinal diameter. 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 -We discussed this diagnosis but also no further need for follow-up and low pressure system that she may run this by cardiology     Recommended follow up: Return for next already scheduled visit or sooner if needed.  Already scheduled for physical Future Appointments  Date Time Provider Department Center  07/25/2024  9:40 AM Ladona Heinz, MD [facility]  [facility]  08/02/2024 10:00 AM [facility] NURSE [facility] None  09/30/2024 10:00 AM Trixie File, MD [facility] None  01/10/2025  8:20 AM Katrinka Garnette KIDD, MD [facility]  02/18/2025  8:30 AM Cathlyn JAYSON Nikki Bobie FORBES, MD [facility] None  03/17/2025  8:40 AM [facility] ANNUAL WELLNESS VISIT 1 [facility]    Lab/Order associations:   ICD-10-CM   1. Hyperlipidemia, unspecified hyperlipidemia type  E78.5     2. Gastroesophageal reflux disease, unspecified whether esophagitis present  K21.9     3. Prediabetes  R73.03       No orders of the defined types were placed in this encounter.   Return precautions advised.  Garnette Katrinka, MD

## 2024-07-25 ENCOUNTER — Encounter: Payer: Self-pay | Admitting: *Deleted

## 2024-07-25 ENCOUNTER — Ambulatory Visit: Attending: Cardiology | Admitting: Cardiology

## 2024-07-25 ENCOUNTER — Encounter: Payer: Self-pay | Admitting: Cardiology

## 2024-07-25 ENCOUNTER — Other Ambulatory Visit (HOSPITAL_COMMUNITY): Payer: Self-pay

## 2024-07-25 ENCOUNTER — Other Ambulatory Visit: Payer: Self-pay | Admitting: *Deleted

## 2024-07-25 VITALS — BP 130/80 | HR 76 | Resp 16 | Ht 66.0 in | Wt 137.2 lb

## 2024-07-25 DIAGNOSIS — R739 Hyperglycemia, unspecified: Secondary | ICD-10-CM | POA: Diagnosis not present

## 2024-07-25 DIAGNOSIS — R002 Palpitations: Secondary | ICD-10-CM

## 2024-07-25 DIAGNOSIS — E7800 Pure hypercholesterolemia, unspecified: Secondary | ICD-10-CM

## 2024-07-25 DIAGNOSIS — I493 Ventricular premature depolarization: Secondary | ICD-10-CM

## 2024-07-25 MED ORDER — EZETIMIBE 10 MG PO TABS
10.0000 mg | ORAL_TABLET | Freq: Every day | ORAL | 0 refills | Status: DC
  Filled 2024-07-25: qty 90, 90d supply, fill #0

## 2024-07-25 MED ORDER — ATORVASTATIN CALCIUM 10 MG PO TABS
10.0000 mg | ORAL_TABLET | Freq: Every day | ORAL | 2 refills | Status: DC
  Filled 2024-07-25: qty 30, 30d supply, fill #0

## 2024-07-25 NOTE — Progress Notes (Signed)
 Cardiology Office Note:  .   Date:  07/28/2024  ID:  Dana Chambers, DOB 12-19-1945, MRN [id number] PCP: Dana Garnette KIDD, MD  [facility] Providers Cardiologist:  Dana Bergamo, MD   History of Present Illness: TAHRA Dana Chambers is a 78 y.o. very active Caucasian female patient with hyperglycemia, hypercholesterolemia, coronary calcium  score of 0 on 01/16/2019 and also again on 01/31/2024 but revealing mild aortic atherosclerosis and presently on a statin referred to me for evaluation of palpitations.  Except for occasional palpitations especially at rest she remains asymptomatic.  Has been more elevated lipids and has prediabetes mellitus but has been reluctant to taking medications.  Cardiac Studies relevent.        Discussed the use of AI scribe software for clinical note transcription with the patient, who gave verbal consent to proceed.  History of Present Illness VIA Dana Chambers is a 78 year old female who presents for evaluation of aortic atherosclerosis and PVCs. She is accompanied by her husband, Dana Chambers. She was referred by Dr. Garnette Dana for further evaluation of her cardiac health.  She has a family history of cardiac issues, with her father having died at 3 from sudden cardiac arrest and her mother and several family members having blockages. A cardiac CT in 2020 was performed due to high cholesterol and prediabetes. A follow-up cardiac CT in March 2025 showed aortic atherosclerosis, but her coronary calcium  score remained at zero.  She was on rosuvastatin , increased from once a week to daily, but discontinued due to severe constipation. A scan ruled out a blockage. She is currently not on any statin.  She experiences PVCs, identified during an EKG, which are more noticeable when lying down and exacerbated by alcohol and caffeine. She typically consumes a glass of wine with dinner.  Her physical activity includes water walking at the local [employer] twice a week for  about 30 minutes. Her blood pressure is usually around 120/80 mmHg but was higher during the visit, possibly due to recent activity preparing for a trip to [facility]. She does not use tobacco products and has never smoked.   Labs   Lab Results  Component Value Date   CHOL 269 (H) 07/01/2024   HDL 68.70 07/01/2024   LDLCALC 180 (H) 07/01/2024   LDLDIRECT 168.5 02/02/2012   TRIG 100.0 07/01/2024   CHOLHDL 4 07/01/2024   No results found for: LIPOA  Recent Labs    12/27/23 0920 05/13/24 1047 07/01/24 0842  NA 140 140 141  140  K 4.3 5.5* 4.7  4.4  CL 102 102 102  102  CO2 31 25 31  31   GLUCOSE 95 94 94  98  BUN 17 21 22  22   CREATININE 0.92 0.82 0.91  0.90  CALCIUM  9.2 9.8 9.7  9.4    Lab Results  Component Value Date   ALT 10 07/01/2024   AST 16 07/01/2024   ALKPHOS 55 07/01/2024   BILITOT 0.5 07/01/2024      Latest Ref Rng & Units 05/13/2024   10:47 AM 12/27/2023    9:20 AM 06/22/2023    8:14 AM  CBC  WBC 3.8 - 10.8 Thousand/uL 7.0  5.7  5.5   Hemoglobin 11.7 - 15.5 g/dL 86.3  85.7  85.8   Hematocrit 35.0 - 45.0 % 42.5  43.2  42.9   Platelets 140 - 400 Thousand/uL 306  297.0  322.0    Lab Results  Component Value Date  HGBA1C 6.3 07/01/2024    Lab Results  Component Value Date   TSH 1.46 12/27/2023     ROS  Review of Systems  Cardiovascular:  Positive for palpitations. Negative for chest pain, dyspnea on exertion and leg swelling.   Physical Exam:   VS:  BP 130/80 (BP Location: Left Arm, Patient Position: Sitting, Cuff Size: Normal)   Pulse 76   Resp 16   Ht 5' 6 (1.676 m)   Wt 137 lb 3.2 oz (62.2 kg)   LMP  (LMP Unknown)   SpO2 97%   BMI 22.14 kg/m    Wt Readings from Last 3 Encounters:  07/25/24 137 lb 3.2 oz (62.2 kg)  07/08/24 137 lb 9.6 oz (62.4 kg)  05/13/24 140 lb 8 oz (63.7 kg)    BP Readings from Last 3 Encounters:  07/25/24 130/80  07/08/24 110/70  05/13/24 136/80   Physical Exam Neck:     Vascular: No carotid  bruit or JVD.  Cardiovascular:     Rate and Rhythm: Normal rate and regular rhythm.     Pulses: Intact distal pulses.     Heart sounds: Normal heart sounds. No murmur heard.    No gallop.  Pulmonary:     Effort: Pulmonary effort is normal.     Breath sounds: Normal breath sounds.  Abdominal:     General: Bowel sounds are normal.     Palpations: Abdomen is soft.  Musculoskeletal:     Right lower leg: No edema.     Left lower leg: No edema.    EKG:    EKG Interpretation Date/Time:  Thursday July 25 2024 09:08:36 EDT Ventricular Rate:  79 PR Interval:  156 QRS Duration:  70 QT Interval:  372 QTC Calculation: 426 R Axis:   26  Text Interpretation: EKG 07/25/2024: Normal sinus rhythm at rate of 79 bpm, normal axis, poor R wave progression, low-voltage complexes.  Frequent PVCs in quadrigeminal pattern (3). Pulmonary disease pattern.  Compared to 02/20/2024 EKG from PCP, left atrial enlargement not present and PVCs more frequent. Confirmed by Ettamae Barkett, Jagadeesh [phone]) on 07/25/2024 9:16:10 AM  EKG 02/20/2024: Normal sinus rhythm at rate of 72 bpm, left atrial enlargement, single PVC.  ASSESSMENT AND PLAN: .      ICD-10-CM   1. Palpitations  R00.2 EKG 12-Lead    EXERCISE TOLERANCE TEST (ETT)    2. Hyperglycemia  R73.9     3. Hypercholesteremia  E78.00 atorvastatin  (LIPITOR) 10 MG tablet    ezetimibe  (ZETIA ) 10 MG tablet    EXERCISE TOLERANCE TEST (ETT)    Lipid Profile    Cardiac Stress Test: Informed Consent Details: Physician/Practitioner Attestation; Transcribe to consent form and obtain patient signature    4. PVC (premature ventricular contraction)  I49.3 EXERCISE TOLERANCE TEST (ETT)    Cardiac Stress Test: Informed Consent Details: Physician/Practitioner Attestation; Transcribe to consent form and obtain patient signature     Assessment & Plan Hypercholesterolemia with aortic atherosclerosis Coronary calcium  score is zero, indicating no calcified plaque, but does not  rule out soft plaque. Aortic atherosclerosis is age-appropriate. Previous use of rosuvastatin  led to severe constipation, so it was discontinued. High LDL cholesterol at 180 mg/dL. Discussed the importance of managing cholesterol to reduce the risk of heart attack or stroke. Explained that aging cannot be stopped, and some form of atherosclerosis is expected with age. Lipitor 10 mg daily is expected to reduce LDL by 36-38%. - Prescribe Lipitor 10 mg daily for cholesterol management. - Prescribe Zetia   for 90 days to complement Lipitor. - Check cholesterol levels in 2 months.  Goal LDL <100. - Advise to monitor for side effects such as constipation or abdominal discomfort. - Recommend Metamucil daily to aid with bowel regularity and cholesterol management.  Ventricular premature complexes (PVCs) PVCs are felt more at rest, especially when lying down, due to lower heart rate. They are benign and occur due to extra heartbeats. No symptoms during exercise, indicating she is not exacerbated by physical activity. - Order treadmill test to evaluate PVCs during exercise. - Follow up as needed based on treadmill test results.  Prediabetes A1c is 6.3%, indicating prediabetes and approaching diabetes range. Discussed potential use of metformin, but deferred to endocrinologist for management. Acknowledged that cholesterol medication may slightly increase A1c by 0.1 points. - Discuss potential metformin use with endocrinologist in November.  Constipation Severe constipation occurred with rosuvastatin  use, leading to its discontinuation. Regular bowel movements were maintained prior to medication change. Discussed the role of fiber in managing constipation and its benefits for cholesterol and diabetes. - Recommend daily Metamucil to improve bowel regularity. - Advise to monitor bowel habits and report any persistent issues.   Follow up: As needed  Signed,  Dana Bergamo, MD, [facility] 07/28/2024, 12:55 PM [facility] [address] [place], [place] [address] Phone: [phone]. Fax:  [phone]

## 2024-07-25 NOTE — Patient Instructions (Addendum)
 Medication Instructions:  Your physician has recommended you make the following change in your medication:  Start Atorvastatin  10 mg by mouth daily Start Ezetimibe  10 mg by mouth daily   *If you need a refill on your cardiac medications before your next appointment, please call your pharmacy*  Lab Work: Have lab work drawn in 2 months.  Lipid profile. Can be done at any [facility] location.  There is a [facility] on the first floor of our building If you have labs (blood work) drawn today and your tests are completely normal, you will receive your results only by: MyChart Message (if you have MyChart) OR A paper copy in the mail If you have any lab test that is abnormal or we need to change your treatment, we will call you to review the results.  Testing/Procedures: Your physician has requested that you have an exercise tolerance test. For further information please visit [email or web address] Please also follow instruction sheet, as given.   Follow-Up: At [facility], you and your health needs are our priority.  As part of our continuing mission to provide you with exceptional heart care, our providers are all part of one team.  This team includes your primary Cardiologist (physician) and Advanced Practice Providers or APPs (Physician Assistants and Nurse Practitioners) who all work together to provide you with the care you need, when you need it.  Your next appointment:   As needed  Provider:   Gordy Bergamo, MD    We recommend signing up for the patient portal called MyChart.  Sign up information is provided on this After Visit Summary.  MyChart is used to connect with patients for Virtual Visits (Telemedicine).  Patients are able to view lab/test results, encounter notes, upcoming appointments, etc.  Non-urgent messages can be sent to your provider as well.   To learn more about what you can do with MyChart, go to [email or web address].   Other Instructions

## 2024-07-29 NOTE — Telephone Encounter (Signed)
 [employer]  Patient is ready for scheduling on or after 07/24/24  Out-of-pocket cost due at time of visit: $356.87   Primary: [employer] SHP Prolia  co-insurance: 20% (approximately $331.87) Admin fee co-insurance: 20% (approximately $25)  Deductible: does not apply  Prior Auth: APPROVED PA# [id number] Valid: 05/15/20-11/27/24  Secondary: N/A Prolia  co-insurance:  Admin fee co-insurance:  Deductible:  Prior Auth:  PA# Valid:   ** This summary of benefits is an estimation of the patient's out-of-pocket cost. Exact cost may vary based on individual plan coverage.

## 2024-07-31 ENCOUNTER — Ambulatory Visit: Payer: Medicare PPO

## 2024-08-02 ENCOUNTER — Ambulatory Visit

## 2024-08-02 ENCOUNTER — Encounter (HOSPITAL_COMMUNITY): Payer: Self-pay | Admitting: *Deleted

## 2024-08-02 NOTE — Telephone Encounter (Signed)
 Called pt, went straight to VM

## 2024-08-02 NOTE — Telephone Encounter (Signed)
 Patient questioning payment amount - wanted to clarify cost before injection .  Patient advising that she has never paid anything but $40 foProlia .  I have explained the coinsurance $356.87 due, but she advises that can't be right    Please let patient and office know of cost/insurance verification

## 2024-08-06 DIAGNOSIS — H04212 Epiphora due to excess lacrimation, left lacrimal gland: Secondary | ICD-10-CM | POA: Diagnosis not present

## 2024-08-06 DIAGNOSIS — H11823 Conjunctivochalasis, bilateral: Secondary | ICD-10-CM | POA: Diagnosis not present

## 2024-08-06 DIAGNOSIS — H04123 Dry eye syndrome of bilateral lacrimal glands: Secondary | ICD-10-CM | POA: Diagnosis not present

## 2024-08-07 NOTE — Telephone Encounter (Signed)
 Patient is calling again to recheck the out of pocket cost for Prolia  injection.  Patient states that she has no record of phone call for 08/02/2024.

## 2024-08-07 NOTE — Telephone Encounter (Signed)
 [employer]  Patient is ready for scheduling on or after 07/24/24  Out-of-pocket cost due at time of visit: $0  Primary: [employer] SHP Prolia  co-insurance: 0% Admin fee co-insurance: 0%  Deductible: does not apply  Prior Auth: APPROVED PA# [id number] Valid: 05/15/20-11/27/24  Secondary: N/A Prolia  co-insurance:  Admin fee co-insurance:  Deductible:  Prior Auth:  PA# Valid:   ** This summary of benefits is an estimation of the patient's out-of-pocket cost. Exact cost may vary based on individual plan coverage.

## 2024-08-07 NOTE — Telephone Encounter (Signed)
 Spoke with [employer] rep, she is aware that this has been an issue between [employer] and [employer]. Recommends having pt reach out to [facility] to confirm cost, reaching out to [facility] myself to confirm cost, of checking cost via [employer]. They are actively working on trying to find the reason for the discrepancy in OOP cost.   Called pt, went straight to VM, left message that I would try her back again after 4:00 PM.

## 2024-08-07 NOTE — Telephone Encounter (Signed)
 Benefits re-run through [employer], still saying 20% co-insurance.   Checked benefits via [employer], this says that pt has 0% co-insurance and $0 OOP cost.      Will call [employer] to confirm which is accurate.

## 2024-08-09 ENCOUNTER — Ambulatory Visit

## 2024-08-09 DIAGNOSIS — M818 Other osteoporosis without current pathological fracture: Secondary | ICD-10-CM | POA: Diagnosis not present

## 2024-08-09 MED ORDER — DENOSUMAB 60 MG/ML ~~LOC~~ SOSY: 60.0000 mg | PREFILLED_SYRINGE | Freq: Once | SUBCUTANEOUS | Status: AC

## 2024-08-09 NOTE — Progress Notes (Signed)
 Medication: Prolia  Dosage:60 mg Location: Left arm  Injection given ab:[id number],MFJ Provider: Lela Trixie COME

## 2024-08-15 ENCOUNTER — Ambulatory Visit (HOSPITAL_COMMUNITY)
Admission: RE | Admit: 2024-08-15 | Discharge: 2024-08-15 | Disposition: A | Discharge status: Home / Self Care | Source: Ambulatory Visit | Attending: Internal Medicine | Admitting: Internal Medicine

## 2024-08-15 DIAGNOSIS — E7800 Pure hypercholesterolemia, unspecified: Secondary | ICD-10-CM | POA: Insufficient documentation

## 2024-08-15 DIAGNOSIS — R002 Palpitations: Secondary | ICD-10-CM | POA: Insufficient documentation

## 2024-08-15 DIAGNOSIS — I493 Ventricular premature depolarization: Secondary | ICD-10-CM | POA: Insufficient documentation

## 2024-08-15 LAB — EXERCISE TOLERANCE TEST
Angina Index: 0
Duke Treadmill Score: 4
Estimated workload: 6.2
Exercise duration (min): 4 min
Exercise duration (sec): 20 s
MPHR: 141 {beats}/min
Peak HR: 162 {beats}/min
Percent HR: 114 %
Rest HR: 77 {beats}/min
ST Depression (mm): 0 mm

## 2024-08-17 ENCOUNTER — Ambulatory Visit: Payer: Self-pay | Admitting: Cardiology

## 2024-08-17 DIAGNOSIS — E7800 Pure hypercholesterolemia, unspecified: Secondary | ICD-10-CM

## 2024-08-17 NOTE — Progress Notes (Signed)
 Please let the patient know that there were no arrhythmias, no frequent PVCs, normal stress test.  No further evaluation is indicated from cardiac standpoint.

## 2024-08-19 MED ORDER — ATORVASTATIN CALCIUM 10 MG PO TABS: 10.0000 mg | ORAL_TABLET | Freq: Every day | ORAL | 3 refills | Status: AC

## 2024-08-26 ENCOUNTER — Other Ambulatory Visit: Payer: Self-pay | Admitting: Family Medicine

## 2024-08-26 DIAGNOSIS — Z1231 Encounter for screening mammogram for malignant neoplasm of breast: Secondary | ICD-10-CM

## 2024-08-29 NOTE — Telephone Encounter (Signed)
 Last Prolia  inj 08/09/24 Next Prolia  inj due 02/07/25

## 2024-09-09 DIAGNOSIS — H9313 Tinnitus, bilateral: Secondary | ICD-10-CM | POA: Diagnosis not present

## 2024-09-09 DIAGNOSIS — H903 Sensorineural hearing loss, bilateral: Secondary | ICD-10-CM | POA: Diagnosis not present

## 2024-09-10 ENCOUNTER — Encounter: Payer: Self-pay | Admitting: Cardiology

## 2024-09-13 DIAGNOSIS — H04123 Dry eye syndrome of bilateral lacrimal glands: Secondary | ICD-10-CM | POA: Diagnosis not present

## 2024-09-13 DIAGNOSIS — H04212 Epiphora due to excess lacrimation, left lacrimal gland: Secondary | ICD-10-CM | POA: Diagnosis not present

## 2024-09-24 DIAGNOSIS — E7800 Pure hypercholesterolemia, unspecified: Secondary | ICD-10-CM | POA: Diagnosis not present

## 2024-09-24 LAB — LIPID PANEL
Chol/HDL Ratio: 1.9 ratio (ref 0.0–4.4)
Cholesterol, Total: 156 mg/dL (ref 100–199)
HDL: 81 mg/dL (ref >39)
LDL Chol Calc (NIH): 61 mg/dL (ref 0–99)
Triglycerides: 71 mg/dL (ref 0–149)
VLDL Cholesterol Cal: 14 mg/dL (ref 5–40)

## 2024-09-25 NOTE — Progress Notes (Signed)
 Normal cholesterol levels. Please continue present medications

## 2024-09-30 ENCOUNTER — Encounter: Payer: Self-pay | Admitting: Internal Medicine

## 2024-09-30 ENCOUNTER — Ambulatory Visit: Payer: Medicare PPO | Admitting: Internal Medicine

## 2024-09-30 VITALS — BP 124/80 | Ht 66.0 in | Wt 141.2 lb

## 2024-09-30 DIAGNOSIS — M818 Other osteoporosis without current pathological fracture: Secondary | ICD-10-CM | POA: Diagnosis not present

## 2024-09-30 DIAGNOSIS — E559 Vitamin D deficiency, unspecified: Secondary | ICD-10-CM

## 2024-09-30 DIAGNOSIS — R7303 Prediabetes: Secondary | ICD-10-CM | POA: Diagnosis not present

## 2024-09-30 LAB — POCT GLYCOSYLATED HEMOGLOBIN (HGB A1C): Hemoglobin A1C: 5.8 % — AB (ref 4.0–5.6)

## 2024-09-30 NOTE — Addendum Note (Signed)
 Addended by: CLEOTILDE ROLIN RAMAN on: 09/30/2024 10:28 AM   Modules accepted: Orders

## 2024-09-30 NOTE — Progress Notes (Addendum)
 Patient ID: Dana Chambers, female   DOB: February 05, 1946, 78 y.o.   MRN: [id number]  HPI  Dana Chambers is a 78 y.o.-year-old female, initially referred by Dr. Rockney, for management of osteoporosis.  Last visit 1 year ago.  Interim history: She denies dizziness, vertigo.   No falls or fractures since last visit. She has upper back pain after a recent hike. She has mild arthritic joint aches.  Reviewed history: Pt was dx with OP in 2006.  Reviewed patient's DXA scan reports: Date L1-L3 T score FN T score 33% distal Radius Ultra distal radius   10/24/2022 ([facility]) -0.5 RFN: -2.5 LFN: -2.1 -1.9 (-4%) N/a  No comparisons available for the spine and femoral neck T-scores (?).  Date L1-L3 T score FN T score 33% distal Radius Ultra distal radius   10/19/2020 ([facility]) -0.3 (-1.6%) RFN: -2.4 (+1.5%) LFN: -2.5 (-6.8%*) -1.5 (+9.8%*) N/a  10/17/2018 ([facility]) -0.4 (+12.6%*) RFN: -2.5 (+2.0%) LFN: -2.1 (+3.9%) -2.4 (-3.6%) n/a  10/12/2016 ([facility])  -1.4 (-2%)  RFN: -2.6 LFN: -2.4  -2.1 (-2.9%)  n/a  10/12/2015 ([facility])  -1.3 (+7.1%*)  RFN: -2.6 LFN: -2.7 -1.8  n/a  10/09/2013 ([facility])  -1.8  RFN: -2.5 LFN: -2.2 -3.0 (-14.9%*)  -4.5   09/07/2011 ([facility])  n/a RFN: -2.2   -3.1 (+3.0%)   08/26/2009 ([facility])  -1.1  RFN: -1.7 LFN: -1.9     She denies any fractures. In the past, she had a fall (slipped) x1 >> on coccyx + hands >> no fxs, but developed traumatic carpal tunnel.    Reviewed previous osteoporosis treatments: - Bisphosphonates (Fosamax, Boniva) for approximately 10 years, followed by drug holiday - Prolia  x 11inj now -started in 2016: ... 11/26/2019  05/27/2020 12/30/2020 07/07/2021 01/12/2022 07/15/2022 01/18/2023 07/21/2023 01/24/2024 08/09/2024  We started supplementation in 2018.  Subsequent vitamin D  levels were normal: Lab Results  Component Value Date   VD25OH 55.30 12/27/2023   VD25OH 61.89 06/22/2023   VD25OH  47.18 01/03/2023   VD25OH 48.10 09/26/2022   VD25OH 53.7 08/27/2021   VD25OH 57.7 08/27/2021   VD25OH 47.5 08/21/2020   VD25OH 67.38 08/19/2019   VD25OH 41.53 12/12/2018   VD25OH 40.81 08/13/2018  She is on vitamin D  2000 units daily.  No weightbearing exercises. She goes to [facility] - water walking 45-60 min twice a week.  In the past, she was also using weights but not anymore due to upper back pain.  She has a history of hammertoes and even a small ulcer on the bottoms of her feet >> instability in her feet as she is trying to avoid the pain when walking.  Also, neuropathy sxs in legs, chronic.  She sees Dr. Magdalen.  Menopause was at 78 years old.  Pt does have a FH of osteoporosis: In mother, who also had dowagers hump. Mother's aunt also had this.  No history of hyper or hypocalcemia or hyperparathyroidism.  She has a history of kidney stones and had lithotripsy x2.  No history of hyper or hypocalcemia or hyperparathyroidism. Lab Results  Component Value Date   PTH 48 08/10/2017   PTH Comment 08/10/2017   CALCIUM  9.7 07/01/2024   CALCIUM  9.4 07/01/2024   CALCIUM  9.8 05/13/2024   CALCIUM  9.2 12/27/2023   CALCIUM  9.6 06/22/2023   CALCIUM  9.9 01/09/2023   CALCIUM  9.4 01/06/2023   CALCIUM  9.6 01/03/2023   CALCIUM  9.7 10/31/2022   CALCIUM  10.1 08/26/2022   No thyrotoxicosis: Lab Results  Component Value Date   TSH 1.46 12/27/2023  TSH 1.28 01/03/2023   TSH 1.32 12/27/2021   TSH 1.35 04/16/2021   TSH 1.05 07/16/2018   She had a neck MRI >> thyroid  nodules >> U/S (04/21/2021): small nodules, not worrisome: 1. Findings suggestive of multinodular goiter. 2. None of the discretely measured nodules/cysts, including dominant right-sided partially solid though predominantly cystic nodule correlating with nodule seen on preceding cervical spine MRI, meet imaging criteria to recommend percutaneous sampling or continued dedicated follow-up.  + Mild CKD. Last BUN/Cr: Lab Results   Component Value Date   BUN 22 07/01/2024   BUN 22 07/01/2024   CREATININE 0.90 07/01/2024   CREATININE 0.91 07/01/2024   She has a history of elevated HbA1c: Lab Results  Component Value Date   HGBA1C 6.3 07/01/2024   HGBA1C 6.2 12/27/2023   HGBA1C 6.0 06/22/2023   HGBA1C 6.0 01/03/2023   HGBA1C 6.1 09/26/2022   HGBA1C 6.0 03/28/2022   HGBA1C 6.2 12/27/2021   HGBA1C 5.9 (H) 08/27/2021   HGBA1C CANCELED 08/27/2021   HGBA1C 6.0 12/21/2020   She had BrCA 2006, s/p RxTx, no ChTx >> she was on tamoxifen for 4.5 years. She continues on PPIs for GERD. She also has a history of neuropathy - mostly in L leg.  She is on B12 supplements. She had L shoulder pain in 11/2020 >> sees Dr. Marquette (suspected muscle problem) >> PT and dry needling - did not help. She was started on Crestor  10 mg in 06/2023 by PCP - once a week. Now on Lipitor 10 mg and Zetia  10 mg  daily. She sees Dr. Margaretann.   ROS: + see HPI + acid reflux  I reviewed pt's medications, allergies, PMH, social hx, family hx, and changes were documented in the history of present illness. Otherwise, unchanged from my initial visit note.  Past Medical History:  Diagnosis Date   Basal cell carcinoma of skin    left ankle, right thigh   Bursitis    right hip   Cancer (HCC) 2006   Breast/right   Cataract    COVID 01/27/2021   Diverticulosis    GERD (gastroesophageal reflux disease)    History of breast cancer/33 radiation treatments/ no chemo    2006  S/P RIGHT LUMPECTOMY AND RADIATION FOLLOWED BY DR RUBIN--  NO RECURRENCE   History of kidney stones    Hyperplastic colon polyp    IBS (irritable bowel syndrome)    Idiopathic peripheral neuropathy    bilateral tingling/ due to fall in [facility]   Internal hemorrhoid    Kidney stone    Leiomyoma    Multiple small   Liver cyst 2006   Benign   OA (osteoarthritis)    Osteoporosis 2019   T score -2.5   Personal history of radiation therapy    Raynaud disease    Renal  calculus, left    non obstructive   Rib pain on left side    able to lay on left side   Rosacea    Tinnitus    Past Surgical History:  Procedure Laterality Date   BREAST BIOPSY Left 2016   benign   BREAST EXCISIONAL BIOPSY Right 1973   benign   BREAST LUMPECTOMY  2006   Right -- S/P RADIATION--  NO RECURRENCE   CATARACT EXTRACTION     DILATION AND CURETTAGE OF UTERUS     EXTRACORPOREAL SHOCK WAVE LITHOTRIPSY     X2   HYSTEROSCOPY     HYSTEROSCOPY WITH D & C  08/03/2012   Procedure:  DILATATION AND CURETTAGE /HYSTEROSCOPY;  Surgeon: Evalene SHAUNNA Organ, MD;  Location: [facility];  Service: Gynecology;  Laterality: N/A;   SUPRANUMERARY NIPPLE EXCISION  1973   CYST   TONSILLECTOMY  AGE 42   Social History   Social History   Marital status: Married    Spouse name: N/A   Number of children: 1   Occupational History   Retired- Retail Buyer Also, used to sing >> now hoarseness   Social History Main Topics   Smoking status: Never Smoker   Smokeless tobacco: Never Used   Alcohol use 3.6 oz/week    6 Standard drinks or equivalent per week   Drug use: No   Social History Narrative   Married (husband patient of Dr. Katrinka). 1 daughter. 1 granddaughter.       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       Hobbies: reading, travel, time with granddaughter. Very active   Current Outpatient Medications on File Prior to Visit  Medication Sig Dispense Refill   acetaminophen  (TYLENOL ) 500 MG tablet Take 1,000 mg by mouth 3 (three) times daily as needed.     atorvastatin  (LIPITOR) 10 MG tablet Take 1 tablet (10 mg total) by mouth daily. 90 tablet 3   Cholecalciferol (VITAMIN D  PO) Take by mouth.     Cyanocobalamin  (VITAMIN B 12 PO) Take by mouth.     denosumab  (PROLIA ) 60 MG/ML SOSY injection Inject 60 mg into the skin every 6 (six) months.     esomeprazole  (NEXIUM ) 20 MG capsule TAKE ONE CAPSULE BY MOUTH EVERY DAY AT NOON 90 capsule 3   ezetimibe  (ZETIA ) 10 MG tablet  Take 1 tablet (10 mg total) by mouth daily. 90 tablet 0   famotidine  (PEPCID ) 10 MG tablet Take 10 mg by mouth 2 (two) times daily as needed for heartburn or indigestion.     Current Facility-Administered Medications on File Prior to Visit  Medication Dose Route Frequency Provider Last Rate Last Admin   [START ON 02/06/2025] denosumab  (PROLIA ) injection 60 mg  60 mg Subcutaneous Once Jess Sulak, MD       Allergies  Allergen Reactions   Triamcinolone  Acetonide [Triamcinolone ] Other (See Comments)    Red cheeks and nose, elevated heart rate   Amoxicillin Diarrhea    GI upset   Family History  Problem Relation Age of Onset   Dementia Mother    Depression Mother    Osteoporosis Mother    Arthritis Mother    Stroke Father    Heart attack Father 69   Early death Father    Heart disease Father    Stroke Sister        TIA. ? dementia. another stroke after covid then feeding issues led to eath   Dementia Sister    Rectal cancer Maternal Grandmother    Colon cancer Maternal Grandmother    Cancer Maternal Grandmother    Breast cancer Neg Hx    BRCA 1/2 Neg Hx    PE: BP 124/80   Ht 5' 6 (1.676 m)   Wt 141 lb 3.2 oz (64 kg)   LMP  (LMP Unknown)   BMI 22.79 kg/m  Wt Readings from Last 3 Encounters:  09/30/24 141 lb 3.2 oz (64 kg)  07/25/24 137 lb 3.2 oz (62.2 kg)  07/08/24 137 lb 9.6 oz (62.4 kg)   Constitutional: normal weight, in NAD Eyes:  no exophthalmos ENT: no neck masses, no cervical lymphadenopathy Cardiovascular: RRR, No MRG, mild B periankle edema  Respiratory: CTA B Musculoskeletal: no deformities Skin:no rashes Neurological: + mild tremor with outstretched hands  Assessment: 1. Osteoporosis - she denies fractures but she has a decrease in height from 5 ft 8 in to 5 ft 6.5 in over the years  2.  Vitamin D  insufficiency  3.  Elevated HbA1c  Plan: 1. Osteoporosis -Ackley age-related/postmenopausal and she also has family history of osteoporosis - No  falls or fractures since last visit - We discussed about fall precautions especially since she has instability in her feet due to pain.  We previously discussed about using toe spacers and memory foam soles (Sketchers). - The bone density scan from 09/2020 showed: Bone density improved at most sites with the exception of the left hip, which was worse, however, it may have been related to the fact that the 2019 T-score was an outlier. She had another bone density scan in 09/2022 at [facility].  Unfortunately, there was no formal comparison with the previous study, however, the right femoral neck score was still in the osteoporotic range.  She is due for another bone density scan next month.  Will order this today. - She continues Prolia  injections, with the last 2 being 01/24/2024 and 08/09/2024. - She tolerates Prolia  well, without jaw/hip/thigh pain - We started Prolia  in 2016.  We can continue for 10 years or more, if needed - After we finish Prolia , plan to give her 1 or 2 doses of Reclast - Reviewed the latest kidney function, which was stable, at 65 in 06/2024.  At that time, calcium  level was normal: Lab Results  Component Value Date   CALCIUM  9.7 07/01/2024   CALCIUM  9.4 07/01/2024  -I recommended weightbearing exercises.  She was previously only walking. -I will see her back in 1 year  2.  Vitamin D  insufficiency - Latest vitamin D  level was normal in 06/17/2023 and 11/2023: Lab Results  Component Value Date   VD25OH 55.30 12/27/2023   VD25OH 61.89 06/22/2023  - She continues on 2000 units vitamin D  daily   3.  Elevated HbA1c -Her HbA1c remains in the prediabetic range, including at last check 3 months ago: Lab Results  Component Value Date   HGBA1C 6.3 07/01/2024   HGBA1C 6.2 12/27/2023   HGBA1C 6.0 06/22/2023   HGBA1C 6.0 01/03/2023   HGBA1C 6.1 09/26/2022   HGBA1C 6.0 03/28/2022   HGBA1C 6.2 12/27/2021   HGBA1C 5.9 (H) 08/27/2021   HGBA1C CANCELED 08/27/2021    HGBA1C 6.0 12/21/2020  - We previously discussed about cutting down fatty foods like eggs, cheese, meat  - at today's visit, HbA1c is 5.8% (lower) - No medication needed for now - We will continue to follow her expectantly  11/13/2024 ([facility])  L1-L3 T score FN T score 33% distal Radius  T-score -0.4 RFN: -2.2 LFN: -2.1 -1.5  T-scores are stable in the spine and left femoral neck and improved at the level of the right femoral neck and 33% distal radius.  Lela Fendt, MD PhD [facility]

## 2024-09-30 NOTE — Patient Instructions (Addendum)
 Please continue vitamin D  2000 units daily.  Let's check another DXA scan.  Please come back for a follow-up appointment in 1 year.

## 2024-10-09 ENCOUNTER — Ambulatory Visit

## 2024-10-14 ENCOUNTER — Encounter: Payer: Self-pay | Admitting: Cardiology

## 2024-10-14 ENCOUNTER — Other Ambulatory Visit (HOSPITAL_COMMUNITY): Payer: Self-pay

## 2024-10-14 DIAGNOSIS — E7800 Pure hypercholesterolemia, unspecified: Secondary | ICD-10-CM

## 2024-10-15 MED ORDER — EZETIMIBE 10 MG PO TABS: 10.0000 mg | ORAL_TABLET | Freq: Every day | ORAL | 3 refills | Status: AC

## 2024-10-17 DIAGNOSIS — H02825 Cysts of left lower eyelid: Secondary | ICD-10-CM | POA: Diagnosis not present

## 2024-10-21 DIAGNOSIS — H903 Sensorineural hearing loss, bilateral: Secondary | ICD-10-CM | POA: Diagnosis not present

## 2024-10-31 ENCOUNTER — Ambulatory Visit
Admission: RE | Admit: 2024-10-31 | Discharge: 2024-10-31 | Disposition: A | Source: Ambulatory Visit | Attending: Family Medicine | Admitting: Family Medicine

## 2024-10-31 DIAGNOSIS — Z1231 Encounter for screening mammogram for malignant neoplasm of breast: Secondary | ICD-10-CM

## 2024-11-10 ENCOUNTER — Ambulatory Visit: Payer: Self-pay | Admitting: Obstetrics and Gynecology

## 2024-11-13 LAB — HM DEXA SCAN

## 2024-11-27 ENCOUNTER — Telehealth: Payer: Self-pay

## 2024-11-27 DIAGNOSIS — Z131 Encounter for screening for diabetes mellitus: Secondary | ICD-10-CM

## 2024-11-27 DIAGNOSIS — E785 Hyperlipidemia, unspecified: Secondary | ICD-10-CM

## 2024-11-27 DIAGNOSIS — E559 Vitamin D deficiency, unspecified: Secondary | ICD-10-CM

## 2024-11-27 DIAGNOSIS — E049 Nontoxic goiter, unspecified: Secondary | ICD-10-CM

## 2024-11-27 DIAGNOSIS — Z79899 Other long term (current) drug therapy: Secondary | ICD-10-CM

## 2024-11-27 DIAGNOSIS — R7303 Prediabetes: Secondary | ICD-10-CM

## 2024-11-27 NOTE — Telephone Encounter (Signed)
 Copied from CRM #[id number]. Topic: Clinical - Request for Lab/Test Order >> Nov 27, 2024  8:32 AM [name] wrote: Reason for CRM: Patients states they usually get blood work a week prior to physical and Dr. Katrinka told them to call and schedule no orders in system. Patient is also requesting to have her A1C checked.  Dana Chambers [phone]  Message sent to [facility]

## 2024-11-29 NOTE — Telephone Encounter (Signed)
 I ordered labs per her request-some of these have been done more recently and possible insurance may not cover-cannot go over one by one by messaging but for instance her A1c had been done in November and usually covered only every 6 months.  If she is okay with the ones ordered you can schedule her

## 2024-11-29 NOTE — Addendum Note (Signed)
 Addended by: KATRINKA GARNETTE KIDD on: 11/29/2024 05:37 PM   Modules accepted: Orders

## 2024-12-03 ENCOUNTER — Telehealth: Payer: Self-pay | Admitting: Family Medicine

## 2024-12-03 ENCOUNTER — Encounter: Payer: Self-pay | Admitting: Family Medicine

## 2024-12-03 NOTE — Telephone Encounter (Signed)
 Lab orders placed please call and schedule lab appt.   Thanks!!!!

## 2024-12-03 NOTE — Telephone Encounter (Signed)
 Lvm to sche pt. labs and for her spouse as well, thanks! AW

## 2024-12-03 NOTE — Telephone Encounter (Signed)
 Lvm to get sche aw

## 2024-12-03 NOTE — Telephone Encounter (Signed)
 LVM to schedule lab only visit 1 wk prior to CPE

## 2024-12-30 ENCOUNTER — Other Ambulatory Visit

## 2025-01-01 ENCOUNTER — Other Ambulatory Visit

## 2025-01-01 DIAGNOSIS — E559 Vitamin D deficiency, unspecified: Secondary | ICD-10-CM

## 2025-01-01 DIAGNOSIS — E785 Hyperlipidemia, unspecified: Secondary | ICD-10-CM | POA: Diagnosis not present

## 2025-01-01 DIAGNOSIS — Z131 Encounter for screening for diabetes mellitus: Secondary | ICD-10-CM | POA: Diagnosis not present

## 2025-01-01 DIAGNOSIS — E049 Nontoxic goiter, unspecified: Secondary | ICD-10-CM | POA: Diagnosis not present

## 2025-01-01 DIAGNOSIS — Z79899 Other long term (current) drug therapy: Secondary | ICD-10-CM | POA: Diagnosis not present

## 2025-01-01 DIAGNOSIS — R7303 Prediabetes: Secondary | ICD-10-CM | POA: Diagnosis not present

## 2025-01-01 LAB — CBC WITH DIFFERENTIAL/PLATELET
Basophils Absolute: 0 10*3/uL (ref 0.0–0.1)
Basophils Relative: 0.5 % (ref 0.0–3.0)
Eosinophils Absolute: 0.1 10*3/uL (ref 0.0–0.7)
Eosinophils Relative: 1.6 % (ref 0.0–5.0)
HCT: 41.3 % (ref 36.0–46.0)
Hemoglobin: 13.9 g/dL (ref 12.0–15.0)
Lymphocytes Relative: 28.6 % (ref 12.0–46.0)
Lymphs Abs: 2.6 10*3/uL (ref 0.7–4.0)
MCHC: 33.7 g/dL (ref 30.0–36.0)
MCV: 90.1 fl (ref 78.0–100.0)
Monocytes Absolute: 0.8 10*3/uL (ref 0.1–1.0)
Monocytes Relative: 8.8 % (ref 3.0–12.0)
Neutro Abs: 5.5 10*3/uL (ref 1.4–7.7)
Neutrophils Relative %: 60.5 % (ref 43.0–77.0)
Platelets: 343 10*3/uL (ref 150.0–400.0)
RBC: 4.58 Mil/uL (ref 3.87–5.11)
RDW: 13.6 % (ref 11.5–15.5)
WBC: 9 10*3/uL (ref 4.0–10.5)

## 2025-01-01 LAB — VITAMIN D 25 HYDROXY (VIT D DEFICIENCY, FRACTURES): VITD: 43.06 ng/mL (ref 30.00–100.00)

## 2025-01-01 LAB — HEMOGLOBIN A1C: Hgb A1c MFr Bld: 6.3 % (ref 4.6–6.5)

## 2025-01-01 LAB — VITAMIN B12: Vitamin B-12: 1160 pg/mL — ABNORMAL HIGH (ref 211–911)

## 2025-01-01 LAB — COMPREHENSIVE METABOLIC PANEL WITH GFR
ALT: 17 U/L (ref 3–35)
AST: 21 U/L (ref 5–37)
Albumin: 4.6 g/dL (ref 3.5–5.2)
Alkaline Phosphatase: 63 U/L (ref 39–117)
BUN: 21 mg/dL (ref 6–23)
CO2: 31 meq/L (ref 19–32)
Calcium: 9.5 mg/dL (ref 8.4–10.5)
Chloride: 103 meq/L (ref 96–112)
Creatinine, Ser: 0.91 mg/dL (ref 0.40–1.20)
GFR: 59.68 mL/min — ABNORMAL LOW
Glucose, Bld: 96 mg/dL (ref 70–99)
Potassium: 5.3 meq/L — ABNORMAL HIGH (ref 3.5–5.1)
Sodium: 142 meq/L (ref 135–145)
Total Bilirubin: 0.4 mg/dL (ref 0.2–1.2)
Total Protein: 7.3 g/dL (ref 6.0–8.3)

## 2025-01-01 LAB — LIPID PANEL
Cholesterol: 139 mg/dL (ref 28–200)
HDL: 73.9 mg/dL
LDL Cholesterol: 51 mg/dL (ref 10–99)
NonHDL: 64.96
Total CHOL/HDL Ratio: 2
Triglycerides: 72 mg/dL (ref 10.0–149.0)
VLDL: 14.4 mg/dL (ref 0.0–40.0)

## 2025-01-01 LAB — TSH: TSH: 1.82 u[IU]/mL (ref 0.35–5.50)

## 2025-01-02 ENCOUNTER — Ambulatory Visit: Payer: Self-pay | Admitting: Family Medicine

## 2025-01-02 NOTE — Telephone Encounter (Signed)
 Benefits undisclosed

## 2025-01-03 ENCOUNTER — Other Ambulatory Visit: Payer: Self-pay

## 2025-01-03 DIAGNOSIS — E875 Hyperkalemia: Secondary | ICD-10-CM

## 2025-01-03 NOTE — Progress Notes (Signed)
 Patient will talk more with Dr. Katrinka at appointment on 02/13 before getting lab,

## 2025-01-03 NOTE — Addendum Note (Signed)
 Addended by: CRISTOPHER TAWNI BROCKS on: 01/03/2025 09:13 AM   Modules accepted: Orders

## 2025-01-10 ENCOUNTER — Encounter: Payer: Medicare PPO | Admitting: Family Medicine

## 2025-01-10 ENCOUNTER — Other Ambulatory Visit

## 2025-02-07 ENCOUNTER — Ambulatory Visit

## 2025-02-18 ENCOUNTER — Encounter: Admitting: Obstetrics and Gynecology

## 2025-03-17 ENCOUNTER — Ambulatory Visit

## 2025-09-29 ENCOUNTER — Ambulatory Visit: Admitting: Internal Medicine
